# Patient Record
Sex: Female | Born: 1945 | Race: White | Hispanic: No | Marital: Single | Smoking: Former smoker
Health system: Southern US, Community
[De-identification: ages and names within clinical notes are randomized; demographics above are authoritative.]

## PROBLEM LIST (undated history)

## (undated) DIAGNOSIS — I10 Essential (primary) hypertension: Secondary | ICD-10-CM

## (undated) DIAGNOSIS — G40909 Epilepsy, unspecified, not intractable, without status epilepticus: Secondary | ICD-10-CM

## (undated) DIAGNOSIS — I8390 Asymptomatic varicose veins of unspecified lower extremity: Secondary | ICD-10-CM

## (undated) DIAGNOSIS — F32A Depression, unspecified: Secondary | ICD-10-CM

## (undated) DIAGNOSIS — C719 Malignant neoplasm of brain, unspecified: Secondary | ICD-10-CM

## (undated) DIAGNOSIS — E785 Hyperlipidemia, unspecified: Secondary | ICD-10-CM

## (undated) DIAGNOSIS — E079 Disorder of thyroid, unspecified: Secondary | ICD-10-CM

## (undated) DIAGNOSIS — C801 Malignant (primary) neoplasm, unspecified: Secondary | ICD-10-CM

## (undated) DIAGNOSIS — F329 Major depressive disorder, single episode, unspecified: Secondary | ICD-10-CM

## (undated) DIAGNOSIS — E119 Type 2 diabetes mellitus without complications: Secondary | ICD-10-CM

## (undated) HISTORY — DX: Type 2 diabetes mellitus without complications: E11.9

## (undated) HISTORY — DX: Asymptomatic varicose veins of unspecified lower extremity: I83.90

## (undated) HISTORY — DX: Essential (primary) hypertension: I10

## (undated) HISTORY — PX: BILATERAL CARPAL TUNNEL RELEASE: SHX6508

## (undated) HISTORY — DX: Hyperlipidemia, unspecified: E78.5

## (undated) HISTORY — PX: OTHER SURGICAL HISTORY: SHX169

## (undated) HISTORY — DX: Major depressive disorder, single episode, unspecified: F32.9

## (undated) HISTORY — DX: Epilepsy, unspecified, not intractable, without status epilepticus: G40.909

## (undated) HISTORY — DX: Disorder of thyroid, unspecified: E07.9

## (undated) HISTORY — DX: Depression, unspecified: F32.A

---
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
## 1987-08-11 HISTORY — PX: TOE SURGERY: SHX1073

## 1997-05-10 HISTORY — PX: CERVICAL SPINE SURGERY: SHX589

## 2005-04-27 ENCOUNTER — Ambulatory Visit: Payer: Self-pay

## 2005-11-09 ENCOUNTER — Emergency Department: Payer: Self-pay | Admitting: Emergency Medicine

## 2008-04-09 ENCOUNTER — Ambulatory Visit: Payer: Self-pay

## 2008-04-20 ENCOUNTER — Other Ambulatory Visit: Payer: Self-pay

## 2008-04-20 ENCOUNTER — Ambulatory Visit: Payer: Self-pay | Admitting: Surgery

## 2008-04-24 ENCOUNTER — Ambulatory Visit: Payer: Self-pay | Admitting: Surgery

## 2008-04-24 HISTORY — PX: CHOLECYSTECTOMY: SHX55

## 2008-05-21 ENCOUNTER — Ambulatory Visit: Payer: Self-pay | Admitting: Family Medicine

## 2009-08-02 ENCOUNTER — Emergency Department: Payer: Self-pay | Admitting: Emergency Medicine

## 2009-10-22 ENCOUNTER — Ambulatory Visit: Payer: Self-pay | Admitting: Family Medicine

## 2010-02-26 ENCOUNTER — Ambulatory Visit: Payer: Self-pay | Admitting: Gastroenterology

## 2010-02-26 LAB — HM COLONOSCOPY

## 2010-10-07 ENCOUNTER — Ambulatory Visit: Payer: Self-pay | Admitting: Obstetrics and Gynecology

## 2011-08-17 DIAGNOSIS — I10 Essential (primary) hypertension: Secondary | ICD-10-CM | POA: Diagnosis not present

## 2011-08-17 DIAGNOSIS — Z23 Encounter for immunization: Secondary | ICD-10-CM | POA: Diagnosis not present

## 2011-08-17 DIAGNOSIS — S335XXA Sprain of ligaments of lumbar spine, initial encounter: Secondary | ICD-10-CM | POA: Diagnosis not present

## 2012-01-26 DIAGNOSIS — Z23 Encounter for immunization: Secondary | ICD-10-CM | POA: Diagnosis not present

## 2012-01-26 DIAGNOSIS — I10 Essential (primary) hypertension: Secondary | ICD-10-CM | POA: Diagnosis not present

## 2012-01-26 DIAGNOSIS — J069 Acute upper respiratory infection, unspecified: Secondary | ICD-10-CM | POA: Diagnosis not present

## 2012-03-03 DIAGNOSIS — E785 Hyperlipidemia, unspecified: Secondary | ICD-10-CM | POA: Diagnosis not present

## 2012-03-03 DIAGNOSIS — I10 Essential (primary) hypertension: Secondary | ICD-10-CM | POA: Diagnosis not present

## 2012-03-03 DIAGNOSIS — E039 Hypothyroidism, unspecified: Secondary | ICD-10-CM | POA: Diagnosis not present

## 2012-03-03 DIAGNOSIS — E119 Type 2 diabetes mellitus without complications: Secondary | ICD-10-CM | POA: Diagnosis not present

## 2012-03-08 DIAGNOSIS — R1032 Left lower quadrant pain: Secondary | ICD-10-CM | POA: Diagnosis not present

## 2012-03-08 DIAGNOSIS — I10 Essential (primary) hypertension: Secondary | ICD-10-CM | POA: Diagnosis not present

## 2012-03-08 DIAGNOSIS — E039 Hypothyroidism, unspecified: Secondary | ICD-10-CM | POA: Diagnosis not present

## 2012-03-08 DIAGNOSIS — E785 Hyperlipidemia, unspecified: Secondary | ICD-10-CM | POA: Diagnosis not present

## 2012-03-10 DIAGNOSIS — K5732 Diverticulitis of large intestine without perforation or abscess without bleeding: Secondary | ICD-10-CM | POA: Diagnosis not present

## 2012-03-10 DIAGNOSIS — I10 Essential (primary) hypertension: Secondary | ICD-10-CM | POA: Diagnosis not present

## 2012-03-10 DIAGNOSIS — E039 Hypothyroidism, unspecified: Secondary | ICD-10-CM | POA: Diagnosis not present

## 2012-03-10 DIAGNOSIS — E785 Hyperlipidemia, unspecified: Secondary | ICD-10-CM | POA: Diagnosis not present

## 2012-03-22 DIAGNOSIS — H2510 Age-related nuclear cataract, unspecified eye: Secondary | ICD-10-CM | POA: Diagnosis not present

## 2012-05-31 DIAGNOSIS — E039 Hypothyroidism, unspecified: Secondary | ICD-10-CM | POA: Diagnosis not present

## 2012-05-31 DIAGNOSIS — E119 Type 2 diabetes mellitus without complications: Secondary | ICD-10-CM | POA: Diagnosis not present

## 2012-11-15 DIAGNOSIS — K5732 Diverticulitis of large intestine without perforation or abscess without bleeding: Secondary | ICD-10-CM | POA: Diagnosis not present

## 2012-11-15 DIAGNOSIS — N3090 Cystitis, unspecified without hematuria: Secondary | ICD-10-CM | POA: Diagnosis not present

## 2012-11-15 DIAGNOSIS — Z8669 Personal history of other diseases of the nervous system and sense organs: Secondary | ICD-10-CM | POA: Diagnosis not present

## 2012-12-05 DIAGNOSIS — R569 Unspecified convulsions: Secondary | ICD-10-CM | POA: Diagnosis not present

## 2012-12-05 DIAGNOSIS — R51 Headache: Secondary | ICD-10-CM | POA: Diagnosis not present

## 2012-12-05 DIAGNOSIS — E669 Obesity, unspecified: Secondary | ICD-10-CM | POA: Diagnosis not present

## 2013-01-10 DIAGNOSIS — R569 Unspecified convulsions: Secondary | ICD-10-CM | POA: Diagnosis not present

## 2013-03-27 DIAGNOSIS — K5732 Diverticulitis of large intestine without perforation or abscess without bleeding: Secondary | ICD-10-CM | POA: Diagnosis not present

## 2013-03-27 DIAGNOSIS — N3090 Cystitis, unspecified without hematuria: Secondary | ICD-10-CM | POA: Diagnosis not present

## 2013-03-27 DIAGNOSIS — E119 Type 2 diabetes mellitus without complications: Secondary | ICD-10-CM | POA: Diagnosis not present

## 2013-03-29 DIAGNOSIS — Z0289 Encounter for other administrative examinations: Secondary | ICD-10-CM | POA: Diagnosis not present

## 2013-03-29 DIAGNOSIS — K5732 Diverticulitis of large intestine without perforation or abscess without bleeding: Secondary | ICD-10-CM | POA: Diagnosis not present

## 2013-03-29 DIAGNOSIS — N3090 Cystitis, unspecified without hematuria: Secondary | ICD-10-CM | POA: Diagnosis not present

## 2013-03-29 DIAGNOSIS — Z111 Encounter for screening for respiratory tuberculosis: Secondary | ICD-10-CM | POA: Diagnosis not present

## 2013-05-26 DIAGNOSIS — E669 Obesity, unspecified: Secondary | ICD-10-CM | POA: Diagnosis not present

## 2013-05-26 DIAGNOSIS — E119 Type 2 diabetes mellitus without complications: Secondary | ICD-10-CM | POA: Diagnosis not present

## 2013-05-26 DIAGNOSIS — R51 Headache: Secondary | ICD-10-CM | POA: Diagnosis not present

## 2013-05-26 DIAGNOSIS — R569 Unspecified convulsions: Secondary | ICD-10-CM | POA: Diagnosis not present

## 2013-09-11 DIAGNOSIS — J0100 Acute maxillary sinusitis, unspecified: Secondary | ICD-10-CM | POA: Diagnosis not present

## 2013-09-11 DIAGNOSIS — Z111 Encounter for screening for respiratory tuberculosis: Secondary | ICD-10-CM | POA: Diagnosis not present

## 2013-09-11 DIAGNOSIS — K5732 Diverticulitis of large intestine without perforation or abscess without bleeding: Secondary | ICD-10-CM | POA: Diagnosis not present

## 2014-04-09 DIAGNOSIS — E785 Hyperlipidemia, unspecified: Secondary | ICD-10-CM | POA: Diagnosis not present

## 2014-04-09 DIAGNOSIS — I10 Essential (primary) hypertension: Secondary | ICD-10-CM | POA: Diagnosis not present

## 2014-04-09 DIAGNOSIS — K5732 Diverticulitis of large intestine without perforation or abscess without bleeding: Secondary | ICD-10-CM | POA: Diagnosis not present

## 2014-04-09 DIAGNOSIS — E119 Type 2 diabetes mellitus without complications: Secondary | ICD-10-CM | POA: Diagnosis not present

## 2014-04-09 DIAGNOSIS — E039 Hypothyroidism, unspecified: Secondary | ICD-10-CM | POA: Diagnosis not present

## 2014-04-24 DIAGNOSIS — H2510 Age-related nuclear cataract, unspecified eye: Secondary | ICD-10-CM | POA: Diagnosis not present

## 2014-05-29 DIAGNOSIS — E119 Type 2 diabetes mellitus without complications: Secondary | ICD-10-CM | POA: Insufficient documentation

## 2014-05-29 DIAGNOSIS — G40909 Epilepsy, unspecified, not intractable, without status epilepticus: Secondary | ICD-10-CM | POA: Insufficient documentation

## 2014-05-29 DIAGNOSIS — E669 Obesity, unspecified: Secondary | ICD-10-CM | POA: Insufficient documentation

## 2014-05-29 DIAGNOSIS — M79642 Pain in left hand: Secondary | ICD-10-CM | POA: Insufficient documentation

## 2014-05-29 DIAGNOSIS — I6789 Other cerebrovascular disease: Secondary | ICD-10-CM

## 2014-05-29 DIAGNOSIS — I10 Essential (primary) hypertension: Secondary | ICD-10-CM | POA: Insufficient documentation

## 2014-06-25 DIAGNOSIS — N3001 Acute cystitis with hematuria: Secondary | ICD-10-CM | POA: Diagnosis not present

## 2014-08-22 DIAGNOSIS — K5792 Diverticulitis of intestine, part unspecified, without perforation or abscess without bleeding: Secondary | ICD-10-CM | POA: Diagnosis not present

## 2014-10-17 DIAGNOSIS — E119 Type 2 diabetes mellitus without complications: Secondary | ICD-10-CM | POA: Diagnosis not present

## 2014-10-17 DIAGNOSIS — I868 Varicose veins of other specified sites: Secondary | ICD-10-CM | POA: Diagnosis not present

## 2014-10-17 DIAGNOSIS — R300 Dysuria: Secondary | ICD-10-CM | POA: Diagnosis not present

## 2014-11-09 DIAGNOSIS — I10 Essential (primary) hypertension: Secondary | ICD-10-CM | POA: Diagnosis not present

## 2014-11-09 DIAGNOSIS — E785 Hyperlipidemia, unspecified: Secondary | ICD-10-CM | POA: Diagnosis not present

## 2014-11-09 DIAGNOSIS — I8310 Varicose veins of unspecified lower extremity with inflammation: Secondary | ICD-10-CM | POA: Diagnosis not present

## 2014-11-09 DIAGNOSIS — M79609 Pain in unspecified limb: Secondary | ICD-10-CM | POA: Diagnosis not present

## 2014-11-09 DIAGNOSIS — E119 Type 2 diabetes mellitus without complications: Secondary | ICD-10-CM | POA: Diagnosis not present

## 2014-11-14 ENCOUNTER — Ambulatory Visit
Admit: 2014-11-14 | Disposition: A | Discharge status: Home / Self Care | Payer: Self-pay | Attending: Vascular Surgery | Admitting: Vascular Surgery

## 2014-11-14 DIAGNOSIS — M25532 Pain in left wrist: Secondary | ICD-10-CM | POA: Diagnosis not present

## 2014-11-14 DIAGNOSIS — E119 Type 2 diabetes mellitus without complications: Secondary | ICD-10-CM | POA: Diagnosis not present

## 2014-11-14 DIAGNOSIS — I808 Phlebitis and thrombophlebitis of other sites: Secondary | ICD-10-CM | POA: Diagnosis not present

## 2014-11-14 DIAGNOSIS — Z01812 Encounter for preprocedural laboratory examination: Secondary | ICD-10-CM | POA: Diagnosis not present

## 2014-11-14 DIAGNOSIS — I10 Essential (primary) hypertension: Secondary | ICD-10-CM | POA: Diagnosis not present

## 2014-11-14 DIAGNOSIS — Z79899 Other long term (current) drug therapy: Secondary | ICD-10-CM | POA: Diagnosis not present

## 2014-11-14 DIAGNOSIS — C73 Malignant neoplasm of thyroid gland: Secondary | ICD-10-CM | POA: Diagnosis not present

## 2014-11-14 DIAGNOSIS — E785 Hyperlipidemia, unspecified: Secondary | ICD-10-CM | POA: Diagnosis not present

## 2014-11-14 LAB — PROTIME-INR
INR: 1
Prothrombin Time: 13 secs

## 2014-11-14 LAB — CBC
HCT: 38.2 % (ref 35.0–47.0)
HGB: 12.7 g/dL (ref 12.0–16.0)
MCH: 30.2 pg (ref 26.0–34.0)
MCHC: 33.3 g/dL (ref 32.0–36.0)
MCV: 91 fL (ref 80–100)
PLATELETS: 227 10*3/uL (ref 150–440)
RBC: 4.21 10*6/uL (ref 3.80–5.20)
RDW: 12.5 % (ref 11.5–14.5)
WBC: 10 10*3/uL (ref 3.6–11.0)

## 2014-11-14 LAB — BASIC METABOLIC PANEL
Anion Gap: 9 (ref 7–16)
BUN: 31 mg/dL — ABNORMAL HIGH
CHLORIDE: 110 mmol/L
CO2: 23 mmol/L
Calcium, Total: 9.5 mg/dL
Creatinine: 1.08 mg/dL — ABNORMAL HIGH
EGFR (Non-African Amer.): 53 — ABNORMAL LOW
Glucose: 104 mg/dL — ABNORMAL HIGH
Potassium: 3.8 mmol/L
Sodium: 142 mmol/L

## 2014-11-14 LAB — APTT: Activated PTT: 26.6 secs (ref 23.6–35.9)

## 2014-11-21 ENCOUNTER — Ambulatory Visit
Admit: 2014-11-21 | Disposition: A | Discharge status: Home / Self Care | Payer: Self-pay | Attending: Vascular Surgery | Admitting: Vascular Surgery

## 2014-11-21 DIAGNOSIS — M25532 Pain in left wrist: Secondary | ICD-10-CM | POA: Diagnosis not present

## 2014-11-21 DIAGNOSIS — C73 Malignant neoplasm of thyroid gland: Secondary | ICD-10-CM | POA: Diagnosis not present

## 2014-11-21 DIAGNOSIS — Z8489 Family history of other specified conditions: Secondary | ICD-10-CM | POA: Diagnosis not present

## 2014-11-21 DIAGNOSIS — K5790 Diverticulosis of intestine, part unspecified, without perforation or abscess without bleeding: Secondary | ICD-10-CM | POA: Diagnosis not present

## 2014-11-21 DIAGNOSIS — E119 Type 2 diabetes mellitus without complications: Secondary | ICD-10-CM | POA: Diagnosis not present

## 2014-11-21 DIAGNOSIS — Z8711 Personal history of peptic ulcer disease: Secondary | ICD-10-CM | POA: Diagnosis not present

## 2014-11-21 DIAGNOSIS — Z87442 Personal history of urinary calculi: Secondary | ICD-10-CM | POA: Diagnosis not present

## 2014-11-21 DIAGNOSIS — I82602 Acute embolism and thrombosis of unspecified veins of left upper extremity: Secondary | ICD-10-CM | POA: Diagnosis not present

## 2014-11-21 DIAGNOSIS — Z87440 Personal history of urinary (tract) infections: Secondary | ICD-10-CM | POA: Diagnosis not present

## 2014-11-21 DIAGNOSIS — I808 Phlebitis and thrombophlebitis of other sites: Secondary | ICD-10-CM | POA: Diagnosis not present

## 2014-11-21 DIAGNOSIS — Z833 Family history of diabetes mellitus: Secondary | ICD-10-CM | POA: Diagnosis not present

## 2014-11-21 DIAGNOSIS — I10 Essential (primary) hypertension: Secondary | ICD-10-CM | POA: Diagnosis not present

## 2014-11-21 DIAGNOSIS — E785 Hyperlipidemia, unspecified: Secondary | ICD-10-CM | POA: Diagnosis not present

## 2014-11-21 DIAGNOSIS — Z87891 Personal history of nicotine dependence: Secondary | ICD-10-CM | POA: Diagnosis not present

## 2014-11-21 DIAGNOSIS — I82622 Acute embolism and thrombosis of deep veins of left upper extremity: Secondary | ICD-10-CM | POA: Diagnosis not present

## 2014-11-21 DIAGNOSIS — Z9049 Acquired absence of other specified parts of digestive tract: Secondary | ICD-10-CM | POA: Diagnosis not present

## 2014-11-21 DIAGNOSIS — Z7982 Long term (current) use of aspirin: Secondary | ICD-10-CM | POA: Diagnosis not present

## 2014-11-21 DIAGNOSIS — Z808 Family history of malignant neoplasm of other organs or systems: Secondary | ICD-10-CM | POA: Diagnosis not present

## 2014-11-21 DIAGNOSIS — I998 Other disorder of circulatory system: Secondary | ICD-10-CM | POA: Diagnosis not present

## 2014-11-21 DIAGNOSIS — Z981 Arthrodesis status: Secondary | ICD-10-CM | POA: Diagnosis not present

## 2014-11-21 DIAGNOSIS — Z9889 Other specified postprocedural states: Secondary | ICD-10-CM | POA: Diagnosis not present

## 2014-11-21 DIAGNOSIS — Z79899 Other long term (current) drug therapy: Secondary | ICD-10-CM | POA: Diagnosis not present

## 2014-11-21 DIAGNOSIS — Z8249 Family history of ischemic heart disease and other diseases of the circulatory system: Secondary | ICD-10-CM | POA: Diagnosis not present

## 2014-11-29 DIAGNOSIS — I808 Phlebitis and thrombophlebitis of other sites: Secondary | ICD-10-CM | POA: Diagnosis not present

## 2014-11-29 DIAGNOSIS — E785 Hyperlipidemia, unspecified: Secondary | ICD-10-CM | POA: Diagnosis not present

## 2014-11-29 DIAGNOSIS — I10 Essential (primary) hypertension: Secondary | ICD-10-CM | POA: Diagnosis not present

## 2014-11-29 DIAGNOSIS — Z4802 Encounter for removal of sutures: Secondary | ICD-10-CM | POA: Diagnosis not present

## 2014-11-29 DIAGNOSIS — E119 Type 2 diabetes mellitus without complications: Secondary | ICD-10-CM | POA: Diagnosis not present

## 2014-12-03 LAB — SURGICAL PATHOLOGY

## 2014-12-09 NOTE — Op Note (Signed)
PATIENT NAME:  Martha [name], Martha [name] MR#:  [id number] DATE OF BIRTH:  [birth year]  DATE OF PROCEDURE:  11/21/2014  PREOPERATIVE DIAGNOSES:  1.  Inflamed blood vessel left wrist causing pain.  2.  Hyperlipidemia.  3.  Diabetes.  4.  Hypertension.  5.  Thyroid cancer.   POSTOPERATIVE DIAGNOSES: 1.  Inflamed blood vessel left wrist causing pain.  2.  Hyperlipidemia.  3.  Diabetes.  4.  Hypertension.  5.  Thyroid cancer.   PROCEDURE:  Excision of inflamed blood vessel left wrist.   SURGEON: Algernon Huxley, M.D.   ANESTHESIA: MAC.   ESTIMATED BLOOD LOSS: Minimal.   INDICATION FOR PROCEDURE: This is [name] 69 year old individual who came to my office last week with painful blood vessel of the left wrist. This appeared to be an old thrombosed vein at the wrist with multiple local branches that appeared to be very superficial and causing discomfort. We discussed the options with her.  She desired to have this excised if at all possible. I told her this was certainly [name] reasonable option. I felt that she probably had an old IV near this location and this was probably an old superficial thrombophlebitis that has caused local inflammation and pain. Risks and benefits were discussed. Informed consent was obtained.   DESCRIPTION OF PROCEDURE: The patient is brought to the operative suite. After an adequate level of intravenous sedation was obtained, the left hand and arm were sterilely prepped and draped, and [name] sterile surgical field was created. The incision overlying this vessel was created.  As we dissected down, the vessel was quite thickened and it was actually hard to determine if this was an arterial branch or [name] venous branch, but I felt it was likely the symptomatic vessel.  I clamped it to ensure that the perfusion of the hand remain normal and it did as this was actually an arterial branch. This did not cause any ischemic changes. I dissected this out over several centimeters from the proximal hand back  to the distal arm just before the wrist.  Several branches were ligated and divided.  On dissection, it was ligated distally and proximally with 2-0 silk ties and [name] several centimeter segment was excised.  This corresponded with her symptomatic area.  The wound was then closed with [name] series of interrupted 3-0 Vicryl and 4-0 nylon suture in the skin. Sterile dressing was placed. The patient was awakened from anesthesia and taken to the recovery room in stable condition having tolerated the procedure well.   ____________________________ Algernon Huxley, MD jsd:sp D: 11/21/2014 08:38:38 ET T: 11/21/2014 10:43:25 ET JOB#: [id number]  cc: Algernon Huxley, MD, <Dictator> Jerrell Belfast, MD Algernon Huxley MD ELECTRONICALLY SIGNED 12/05/2014 13:45

## 2014-12-24 DIAGNOSIS — I8390 Asymptomatic varicose veins of unspecified lower extremity: Secondary | ICD-10-CM | POA: Insufficient documentation

## 2014-12-24 DIAGNOSIS — S39012A Strain of muscle, fascia and tendon of lower back, initial encounter: Secondary | ICD-10-CM | POA: Insufficient documentation

## 2014-12-24 DIAGNOSIS — E1165 Type 2 diabetes mellitus with hyperglycemia: Secondary | ICD-10-CM | POA: Insufficient documentation

## 2014-12-24 DIAGNOSIS — E785 Hyperlipidemia, unspecified: Secondary | ICD-10-CM | POA: Insufficient documentation

## 2014-12-24 DIAGNOSIS — N3090 Cystitis, unspecified without hematuria: Secondary | ICD-10-CM | POA: Insufficient documentation

## 2014-12-24 DIAGNOSIS — N3001 Acute cystitis with hematuria: Secondary | ICD-10-CM | POA: Insufficient documentation

## 2014-12-24 DIAGNOSIS — E039 Hypothyroidism, unspecified: Secondary | ICD-10-CM | POA: Insufficient documentation

## 2014-12-24 DIAGNOSIS — F32A Depression, unspecified: Secondary | ICD-10-CM | POA: Insufficient documentation

## 2014-12-24 DIAGNOSIS — K5792 Diverticulitis of intestine, part unspecified, without perforation or abscess without bleeding: Secondary | ICD-10-CM | POA: Insufficient documentation

## 2014-12-24 DIAGNOSIS — I10 Essential (primary) hypertension: Secondary | ICD-10-CM | POA: Insufficient documentation

## 2014-12-24 DIAGNOSIS — G43909 Migraine, unspecified, not intractable, without status migrainosus: Secondary | ICD-10-CM | POA: Insufficient documentation

## 2014-12-24 DIAGNOSIS — Z8669 Personal history of other diseases of the nervous system and sense organs: Secondary | ICD-10-CM | POA: Insufficient documentation

## 2014-12-24 DIAGNOSIS — M545 Low back pain, unspecified: Secondary | ICD-10-CM | POA: Insufficient documentation

## 2014-12-24 DIAGNOSIS — F329 Major depressive disorder, single episode, unspecified: Secondary | ICD-10-CM | POA: Insufficient documentation

## 2014-12-24 DIAGNOSIS — E119 Type 2 diabetes mellitus without complications: Secondary | ICD-10-CM | POA: Insufficient documentation

## 2014-12-24 DIAGNOSIS — R1032 Left lower quadrant pain: Secondary | ICD-10-CM | POA: Insufficient documentation

## 2014-12-24 DIAGNOSIS — IMO0002 Reserved for concepts with insufficient information to code with codable children: Secondary | ICD-10-CM | POA: Insufficient documentation

## 2014-12-24 DIAGNOSIS — G40909 Epilepsy, unspecified, not intractable, without status epilepticus: Secondary | ICD-10-CM | POA: Insufficient documentation

## 2015-01-18 ENCOUNTER — Other Ambulatory Visit: Payer: Self-pay | Admitting: Family Medicine

## 2015-01-22 ENCOUNTER — Encounter: Payer: Self-pay | Admitting: Family Medicine

## 2015-01-22 ENCOUNTER — Ambulatory Visit (INDEPENDENT_AMBULATORY_CARE_PROVIDER_SITE_OTHER): Payer: Medicare Other | Admitting: Family Medicine

## 2015-01-22 VITALS — BP 108/68 | HR 76 | Temp 98.3°F | Resp 16 | Ht 62.0 in | Wt 174.2 lb

## 2015-01-22 DIAGNOSIS — Z780 Asymptomatic menopausal state: Secondary | ICD-10-CM

## 2015-01-22 DIAGNOSIS — Z1389 Encounter for screening for other disorder: Secondary | ICD-10-CM | POA: Diagnosis not present

## 2015-01-22 DIAGNOSIS — E890 Postprocedural hypothyroidism: Secondary | ICD-10-CM

## 2015-01-22 DIAGNOSIS — N189 Chronic kidney disease, unspecified: Secondary | ICD-10-CM | POA: Diagnosis not present

## 2015-01-22 DIAGNOSIS — E1122 Type 2 diabetes mellitus with diabetic chronic kidney disease: Secondary | ICD-10-CM

## 2015-01-22 DIAGNOSIS — E785 Hyperlipidemia, unspecified: Secondary | ICD-10-CM | POA: Diagnosis not present

## 2015-01-22 DIAGNOSIS — I10 Essential (primary) hypertension: Secondary | ICD-10-CM

## 2015-01-22 NOTE — Patient Instructions (Signed)
Take second dose of metformin with the evening meal

## 2015-01-22 NOTE — Progress Notes (Signed)
Subjective:     Patient ID: Martha [name], female   DOB: 1946-07-03, 69 y.o.   MRN: [id number]  HPI  Chief Complaint  Patient presents with  . Diabetes     Patient is present today for 3 month follow up. LOV- 10/17/14 continue Metformin, HgbA1C in house 6.9%  . Hypertension    LOV= 10/17/14 continue Lisnopril/HCTZ  States she forgets to take her second dose of metformin as taking in the evening rather than with her meal.Pending annual eye exam.   Review of Systems  Respiratory: Negative for shortness of breath.   Cardiovascular: Negative for chest pain and palpitations.  Has not seen her gyn for a while and agrees to reestablishing with her.    Objective:   Physical Exam Lungs: clear Heart: RRR without murmur Lower extremities: no edema     Assessment:     1. Type 2 diabetes mellitus with diabetic chronic kidney disease  - Hemoglobin A1c - Renal function panel  2. HLD (hyperlipidemia)  - Lipid panel  3. Essential hypertension   4. Postoperative hypothyroidism  - TSH  5. Menopause present - Ambulatory referral to Gynecology    Plan:     Continue present medication. Take second dose of metformin with her evening meal to increase efficacy and compliance.

## 2015-01-23 ENCOUNTER — Other Ambulatory Visit: Payer: Self-pay | Admitting: Family Medicine

## 2015-01-23 DIAGNOSIS — E1169 Type 2 diabetes mellitus with other specified complication: Secondary | ICD-10-CM

## 2015-01-23 DIAGNOSIS — E785 Hyperlipidemia, unspecified: Principal | ICD-10-CM

## 2015-01-23 LAB — LIPID PANEL
CHOLESTEROL TOTAL: 223 mg/dL — AB (ref 100–199)
Chol/HDL Ratio: 5 ratio units — ABNORMAL HIGH (ref 0.0–4.4)
HDL: 45 mg/dL (ref >39)
Triglycerides: 464 mg/dL — ABNORMAL HIGH (ref 0–149)

## 2015-01-23 LAB — HEMOGLOBIN A1C
Est. average glucose Bld gHb Est-mCnc: 177 mg/dL
HEMOGLOBIN A1C: 7.8 % — AB (ref 4.8–5.6)

## 2015-01-23 LAB — RENAL FUNCTION PANEL
ALBUMIN: 4.2 g/dL (ref 3.6–4.8)
BUN / CREAT RATIO: 28 — AB (ref 11–26)
BUN: 36 mg/dL — ABNORMAL HIGH (ref 8–27)
CHLORIDE: 107 mmol/L (ref 97–108)
CO2: 26 mmol/L (ref 18–29)
Calcium: 9.9 mg/dL (ref 8.7–10.3)
Creatinine, Ser: 1.28 mg/dL — ABNORMAL HIGH (ref 0.57–1.00)
GFR calc Af Amer: 50 mL/min/{1.73_m2} — ABNORMAL LOW (ref >59)
GFR calc non Af Amer: 43 mL/min/{1.73_m2} — ABNORMAL LOW (ref >59)
Glucose: 122 mg/dL — ABNORMAL HIGH (ref 65–99)
Phosphorus: 4.3 mg/dL (ref 2.5–4.5)
Potassium: 4 mmol/L (ref 3.5–5.2)
Sodium: 146 mmol/L — ABNORMAL HIGH (ref 134–144)

## 2015-01-23 LAB — TSH: TSH: 0.128 u[IU]/mL — ABNORMAL LOW (ref 0.450–4.500)

## 2015-01-25 ENCOUNTER — Telehealth: Payer: Self-pay

## 2015-01-25 NOTE — Telephone Encounter (Signed)
-----   Message from Carmon Ginsberg, [place] sent at 01/23/2015  8:03 AM EDT ----- Kidney function has declined a bit so would have her come back in 3 months. Also have added order for Direct LDL-call and see if they can add to current labs

## 2015-01-25 NOTE — Telephone Encounter (Signed)
Patient has been advised of lab report.

## 2015-02-14 ENCOUNTER — Ambulatory Visit (INDEPENDENT_AMBULATORY_CARE_PROVIDER_SITE_OTHER): Payer: Medicare Other | Admitting: Obstetrics and Gynecology

## 2015-02-14 ENCOUNTER — Encounter: Payer: Self-pay | Admitting: Obstetrics and Gynecology

## 2015-02-14 VITALS — BP 117/79 | HR 93 | Ht 61.0 in | Wt 172.2 lb

## 2015-02-14 DIAGNOSIS — Z9189 Other specified personal risk factors, not elsewhere classified: Secondary | ICD-10-CM

## 2015-02-14 DIAGNOSIS — Z124 Encounter for screening for malignant neoplasm of cervix: Secondary | ICD-10-CM | POA: Diagnosis not present

## 2015-02-14 DIAGNOSIS — Z1231 Encounter for screening mammogram for malignant neoplasm of breast: Secondary | ICD-10-CM

## 2015-02-14 DIAGNOSIS — Z01419 Encounter for gynecological examination (general) (routine) without abnormal findings: Secondary | ICD-10-CM | POA: Diagnosis not present

## 2015-02-14 DIAGNOSIS — Z87898 Personal history of other specified conditions: Secondary | ICD-10-CM | POA: Diagnosis not present

## 2015-02-14 DIAGNOSIS — D229 Melanocytic nevi, unspecified: Secondary | ICD-10-CM | POA: Diagnosis not present

## 2015-02-14 NOTE — Progress Notes (Signed)
Subjective:    Martha [name] is a 69 y.o. G0P0 single postmenopausal female who presents for annual exam. Patient referred from PCP as she has not had a gynecologic exam in ~ 10 years. The patient has no complaints today. The patient is not currently sexually active. GYN screening history: last pap: approximate date 10 years ago and was normal and last mammogram: approximate date approximately 10 years ago and was normal. Last colonoscopy was 2015, normal. The patient is not taking hormone replacement therapy. Patient denies post-menopausal vaginal bleeding.. The patient wears seatbelts: yes. The patient participates in regular exercise: yes. Has the patient ever been transfused or tattooed?: no. The patient reports that there is not domestic violence in her life.   Menstrual History: OB History    Gravida Para Term Preterm AB TAB SAB Ectopic Multiple Living   0 0 0 0 0 0 0 0 0 0       Menarche age: 21 No LMP recorded. Patient is postmenopausal.    Past Medical History  Diagnosis Date  . Hypertension   . Diabetes mellitus without complication   . Hyperlipidemia   . Depression   . Varicose vein   . Epilepsy   . Thyroid disease     h/o thyroid ca- 30 years ago   Past Surgical History  Procedure Laterality Date  . Cholecystectomy  04/24/2008    status post laparoscopic Dr.Ely  . Cervical spine surgery  05/1997    spinal fusion  . Bilateral carpal tunnel release  1990,1991    left hand in 1990 right hand in '91  . Toe surgery Left 1989  . Thyroidectomy, partial  1964    due to cancerous tumor    Family History  Problem Relation Age of Onset  . Arthritis Mother   . Hyperlipidemia Mother   . Hypertension Mother   . Heart disease Mother   . Diabetes Mother     type 2  . Thyroid disease Mother   . Cancer Father     colon  . Alcohol abuse Brother   . Hyperlipidemia Brother   . Hypertension Brother   . Breast cancer Neg Hx   . Ovarian cancer Neg Hx     History    Social History  . Marital Status: Single    Spouse Name: N/A  . Number of Children: N/A  . Years of Education: N/A   Occupational History  . Not on file.   Social History Main Topics  . Smoking status: Former Research scientist (life sciences)  . Smokeless tobacco: Not on file     Comment: quit smoking in 2007  . Alcohol Use: No  . Drug Use: No  . Sexual Activity: No   Other Topics Concern  . Not on file   Social History Narrative   Outpatient Encounter Prescriptions as of 02/14/2015  Medication Sig Note  . amLODipine (NORVASC) 5 MG tablet Take 5 mg by mouth daily. 12/24/2014: Received from: [facility]:   . levothyroxine (SYNTHROID, LEVOTHROID) 137 MCG tablet Take 137 mcg by mouth daily. 12/24/2014: Received from: [facility]:   . lisinopril-hydrochlorothiazide (PRINZIDE,ZESTORETIC) 20-25 MG per tablet TAKE 1 TABLET BY MOUTH ONCE A DAY   . metFORMIN (GLUCOPHAGE) 1000 MG tablet Take 1 tablet by mouth 2 (two) times daily. 12/24/2014: Received from: [facility]:   . MULTIPLE VITAMIN PO Take 1 tablet by mouth daily. 12/24/2014: Received from: [facility]:   . pravastatin (  PRAVACHOL) 40 MG tablet Take 1 tablet by mouth daily. 12/24/2014: Received from: [facility]:   . sertraline (ZOLOFT) 50 MG tablet Take 1 tablet by mouth daily. 12/24/2014: Received from: [facility]:     Allergies  Allergen Reactions  . Penicillins Rash     Review of Systems Constitutional: negative for chills, fatigue, fevers and sweats Eyes: negative for irritation, redness and visual disturbance Ears, nose, mouth, throat, and face: negative for hearing loss, nasal congestion, snoring and tinnitus Respiratory: negative for asthma, cough, sputum Cardiovascular: negative for chest pain, dyspnea, exertional chest pressure/discomfort, irregular heart beat,  palpitations and syncope Gastrointestinal: negative for abdominal pain, change in bowel habits, nausea and vomiting Genitourinary: negative for abnormal menstrual periods, genital lesions, sexual problems and vaginal discharge, dysuria and urinary incontinence Integument/breast: negative for breast lump, breast tenderness and nipple discharge Hematologic/lymphatic: negative for bleeding and easy bruising Musculoskeletal:negative for back pain and muscle weakness Neurological: negative for dizziness, headaches, vertigo and weakness Endocrine: negative for diabetic symptoms including polydipsia, polyuria and skin dryness Allergic/Immunologic: negative for hay fever and urticaria     Objective:  Blood pressure 117/79, pulse 93, height 5\' 1"  (1.549 m), weight 172 lb 3.2 oz (78.109 kg).  General Appearance:    Alert, cooperative, no distress, appears stated age  Head:    Normocephalic, without obvious abnormality, atraumatic  Eyes:    PERRL, conjunctiva/corneas clear, EOM's intact, both eyes  Ears:    Normal external ear canals, both ears  Nose:   Nares normal, septum midline, mucosa normal, no drainage or sinus tenderness  Throat:   Lips, mucosa, and tongue normal; teeth and gums normal  Neck:   Supple, symmetrical, trachea midline, no adenopathy; thyroid: no enlargement/tenderness/nodules; no carotid bruit or JVD  Back:     Symmetric, no curvature, ROM normal, no CVA tenderness  Lungs:     Clear to auscultation bilaterally, respirations unlabored  Chest Wall:    No tenderness or deformity   Heart:    Regular rate and rhythm, S1 and S2 normal, no murmur, rub or gallop  Breast Exam:    No tenderness, masses, or nipple abnormality  Abdomen:     Soft, non-tender, bowel sounds active all four quadrants, no masses, no organomegaly.    Genitalia:    Pelvic:external genitalia normal. Rectovaginal septum normal. Vagina mildly stenotic, vagina without lesions, discharge, or tenderness.  Moderate atrophy  present.  Cervix normal in appearance, no cervical motion tenderness, no adnexal masses or tenderness.     Rectal:    Normal external sphincter.  No hemorrhoids appreciated. Internal exam not done.   Extremities:   Extremities normal, atraumatic, no cyanosis or edema  Pulses:   2+ and symmetric all extremities  Skin:   Skin color, texture, turgor normal, no rashes.  Numerous moles on skin, several irregular shaped, crusting.    Lymph nodes:   Cervical, supraclavicular, and axillary nodes normal  Neurologic:   CNII-XII intact, normal strength, sensation and reflexes    throughout     Assessment:    Normal gyn exam Menopause  Numerous moles on skin, several irregular shaped, crusting.   Plan:    Discussed healthy lifestyle modifications. Patient out of age range for pap, and no h/o CIN II or higher in the past. Does not need further pap smears, but still encouraged to have pelvic exam performed every 1-2 years as patient still has all reproductive organs.  Follow up in 1 year or as  needed. Mammogram.   Has had recent screening labs by PCP.  Discussed role of DEXA Scan for bone health.  Will order. Encouraged to take Vit D and Calcium supplements.  Referral to Dermatology for assessment of moles of skin.    Rubie Maid, MD [facility]

## 2015-02-14 NOTE — Progress Notes (Signed)
Patient ID: Martha [name], female   DOB: [birth year], 69 y.o.   MRN: [id number]   Pt presents for gyn exam. menopausal mammo greater than 10 years Pap 5 years ago- wnl

## 2015-03-18 DIAGNOSIS — Z08 Encounter for follow-up examination after completed treatment for malignant neoplasm: Secondary | ICD-10-CM | POA: Diagnosis not present

## 2015-03-18 DIAGNOSIS — Z85828 Personal history of other malignant neoplasm of skin: Secondary | ICD-10-CM | POA: Diagnosis not present

## 2015-03-18 DIAGNOSIS — Z1283 Encounter for screening for malignant neoplasm of skin: Secondary | ICD-10-CM | POA: Diagnosis not present

## 2015-03-18 DIAGNOSIS — L821 Other seborrheic keratosis: Secondary | ICD-10-CM | POA: Diagnosis not present

## 2015-04-29 ENCOUNTER — Ambulatory Visit (INDEPENDENT_AMBULATORY_CARE_PROVIDER_SITE_OTHER): Payer: Medicare Other | Admitting: Family Medicine

## 2015-04-29 ENCOUNTER — Ambulatory Visit: Payer: Medicare Other | Admitting: Family Medicine

## 2015-04-29 ENCOUNTER — Encounter: Payer: Self-pay | Admitting: Family Medicine

## 2015-04-29 VITALS — BP 110/70 | HR 85 | Temp 97.4°F | Resp 16 | Wt 171.5 lb

## 2015-04-29 DIAGNOSIS — E1122 Type 2 diabetes mellitus with diabetic chronic kidney disease: Secondary | ICD-10-CM

## 2015-04-29 DIAGNOSIS — N189 Chronic kidney disease, unspecified: Secondary | ICD-10-CM | POA: Diagnosis not present

## 2015-04-29 DIAGNOSIS — M722 Plantar fascial fibromatosis: Secondary | ICD-10-CM | POA: Diagnosis not present

## 2015-04-29 DIAGNOSIS — E890 Postprocedural hypothyroidism: Secondary | ICD-10-CM | POA: Diagnosis not present

## 2015-04-29 LAB — POCT GLYCOSYLATED HEMOGLOBIN (HGB A1C): Hemoglobin A1C: 7.6

## 2015-04-29 MED ORDER — HYDROCODONE-ACETAMINOPHEN 5-325 MG PO TABS: ORAL_TABLET | ORAL | Status: DC

## 2015-04-29 MED ORDER — GLIPIZIDE 5 MG PO TABS: 5.0000 mg | ORAL_TABLET | Freq: Two times a day (BID) | ORAL | Status: DC

## 2015-04-29 NOTE — Progress Notes (Signed)
Subjective:     Patient ID: Martha [name], female   DOB: Apr 13, 1946, 69 y.o.   MRN: [id number]  HPI  Chief Complaint  Patient presents with  . Foot Pain    Patient comes in office today with complaints of right foot/ankle pain for the past week and half. Patient reports prior to when pain had began she was climing up and down stairs in her home putting items away and believes she may have twisted it. Patient reports taking otc Ibupofen and using boot and crutches for stability  . Diabetes    Patient is present in offie for follow up from 01/22/15, HgbA1C in house was 7.8%. Renal Function Panel ordered showed that patient had a sliht decline in her kidney function  States her foot felt like it was getting better until the last 24 hours when the pain became severe: "I was crawling to the bathroom." Localizes to the plantar arch of her right foot. States she has been doing better remembering to take metformin twice daily. and has been swimming until she injured her foot.   Review of Systems     Objective:   Physical Exam  Constitutional: She appears well-developed and well-nourished. She appears distressed (due to pain with weight bearing).  Cardiovascular:  Pulses:      Dorsalis pedis pulses are 2+ on the right side.       Posterior tibial pulses are 2+ on the right side.  Musculoskeletal:  Mild swelling of the dorsum of her foot with pain on palpation of her plantar mid-arch. Ankle and foot ligaments stable, DF/PF 5/5.       Assessment:    1. Type 2 diabetes mellitus with diabetic chronic kidney disease - POCT glycosylated hemoglobin (Hb A1C) - glipiZIDE (GLUCOTROL) 5 MG tablet; Take 1 tablet (5 mg total) by mouth 2 (two) times daily before a meal.  Dispense: 60 tablet; Refill: 2 - Renal function panel  2. Plantar fasciitis of right foot* - HYDROcodone-acetaminophen (NORCO/VICODIN) 5-325 MG per tablet; One every 4-6 hours as needed for pain  Dispense: 28 tablet; Refill: 0 -  Ambulatory referral to Podiatry  3. Postoperative hypothyroidism - T4, free - TSH    Plan:    Stop nsaid's until we can reevaluate your kidney function.

## 2015-04-29 NOTE — Patient Instructions (Addendum)
We will call you with referral and lab results. Stop ibuprofen or similar medication due to your kidney status

## 2015-05-02 ENCOUNTER — Ambulatory Visit (INDEPENDENT_AMBULATORY_CARE_PROVIDER_SITE_OTHER): Payer: Medicare Other | Admitting: Podiatry

## 2015-05-02 ENCOUNTER — Encounter: Payer: Self-pay | Admitting: Podiatry

## 2015-05-02 ENCOUNTER — Telehealth: Payer: Self-pay | Admitting: *Deleted

## 2015-05-02 ENCOUNTER — Ambulatory Visit: Payer: Medicare Other

## 2015-05-02 VITALS — BP 93/63 | HR 105 | Resp 17 | Ht 60.0 in

## 2015-05-02 DIAGNOSIS — M109 Gout, unspecified: Secondary | ICD-10-CM

## 2015-05-02 DIAGNOSIS — M79671 Pain in right foot: Secondary | ICD-10-CM

## 2015-05-02 DIAGNOSIS — M8430XA Stress fracture, unspecified site, initial encounter for fracture: Secondary | ICD-10-CM

## 2015-05-02 DIAGNOSIS — M1A0710 Idiopathic chronic gout, right ankle and foot, without tophus (tophi): Secondary | ICD-10-CM

## 2015-05-02 NOTE — Progress Notes (Signed)
   Subjective:    Patient ID: Martha [name], female    DOB: June 29, 1946, 69 y.o.   MRN: [id number]  HPI 69 year old female presents the office today with concerns of right foot and ankle pain which is been ongoing for approximate 2 weeks. She states that 2 weeks ago she started having increased pain swelling and redness to her right foot within the symptoms subsided and she started to have recurrence of the last couple of days. She states that she is unable to put weight on her foot. She states that putting pressure to the foot increases the pain. She denies any recent injury or trauma. She denies any history of gout. She previously has had plantar fasciitis in this foot and believes that this may be a flare. No tingling or numbness. No other complaints at this time.   Review of Systems  Cardiovascular:       Foot swelling   Gastrointestinal: Positive for nausea and vomiting.  All other systems reviewed and are negative.      Objective:   Physical Exam AAO x3, NAD DP/PT pulses palpable bilaterally, CRT less than 3 seconds Protective sensation intact with Simms Weinstein monofilament, vibratory sensation intact, Achilles tendon reflex intact There is tenderness to palpation to The Second Metatarsal and There Is Pain in Vibratory Sensation Overlying This Area. There Is Diffuse Tenderness of the Ankle and mild pain with range of motion of the ankle joint however there is no restriction. There is mild diffuse tenderness along the dorsal medial midfoot. There is diffuse edema from ankle to the toes with increase in warmth compared to the contralateral extremity. There is a faint amount of erythema overlying the foot. There is no open lesions or pre-ulcerative lesions. There is no areas of fluctuance or crepitus. No interdigital maceration. No other areas of tenderness to bilateral lower extremities. MMT 5/5, ROM WNL.  No open lesions or pre-ulcerative lesions.  No overlying edema, erythema,  increase in warmth to bilateral lower extremities.  No pain with calf compression, swelling, warmth, erythema bilaterally.      Assessment & Plan:  69 year old female presents with right foot pain, swelling inability to bear weight possible stress fracture vs. Gout vs. Infection (less likely) -X-rays were obtained and reviewed with the patient.  -Treatment options discussed including all alternatives, risks, and complications -Etiology of symptoms were discussed -Due to the inability to bear weight dispensed short CAM boot. -Elevation. -Will order CBC, Uric acid, ESR, CRP -Follow-up in 2 weeks or sooner if any problems arise. In the meantime, encouraged to call the office with any questions, concerns, change in symptoms.   Celesta Gentile, DPM  Addendum: I received the results of the blood work which the uric acid was significantly elevated as well as inflammatory markers. Liposuction was also elevated. I still believe this is gout. We'll start with colchicine 0.6 mg daily.

## 2015-05-03 ENCOUNTER — Telehealth: Payer: Self-pay | Admitting: *Deleted

## 2015-05-03 LAB — CBC WITH DIFFERENTIAL/PLATELET
BASOS ABS: 0 10*3/uL (ref 0.0–0.2)
Basos: 0 %
EOS (ABSOLUTE): 0.2 10*3/uL (ref 0.0–0.4)
Eos: 1 %
HEMOGLOBIN: 12.6 g/dL (ref 11.1–15.9)
Hematocrit: 37.6 % (ref 34.0–46.6)
Immature Grans (Abs): 0 10*3/uL (ref 0.0–0.1)
Immature Granulocytes: 0 %
Lymphocytes Absolute: 2.2 10*3/uL (ref 0.7–3.1)
Lymphs: 16 %
MCH: 30.4 pg (ref 26.6–33.0)
MCHC: 33.5 g/dL (ref 31.5–35.7)
MCV: 91 fL (ref 79–97)
Monocytes Absolute: 0.8 10*3/uL (ref 0.1–0.9)
Monocytes: 6 %
NEUTROS ABS: 10.8 10*3/uL — AB (ref 1.4–7.0)
Neutrophils: 77 %
PLATELETS: 335 10*3/uL (ref 150–379)
RBC: 4.14 x10E6/uL (ref 3.77–5.28)
RDW: 12.8 % (ref 12.3–15.4)
WBC: 14 10*3/uL — ABNORMAL HIGH (ref 3.4–10.8)

## 2015-05-03 LAB — SEDIMENTATION RATE: SED RATE: 5 mm/h (ref 0–40)

## 2015-05-03 LAB — C-REACTIVE PROTEIN: CRP: 57.5 mg/L — AB (ref 0.0–4.9)

## 2015-05-03 LAB — URIC ACID: URIC ACID: 11.8 mg/dL — AB (ref 2.5–7.1)

## 2015-05-03 MED ORDER — COLCHICINE 0.6 MG PO TABS: 0.6000 mg | ORAL_TABLET | Freq: Every day | ORAL | Status: DC

## 2015-05-03 NOTE — Telephone Encounter (Signed)
i called patient per Dr Jacqualyn Posey to let the patient know about her bloodwork and i was sending it to her medical doctor and to tell her about her bloodwork and to go to the pharmacy and pick up her prescription. Lattie Haw

## 2015-05-07 ENCOUNTER — Ambulatory Visit (INDEPENDENT_AMBULATORY_CARE_PROVIDER_SITE_OTHER): Payer: Medicare Other | Admitting: Podiatry

## 2015-05-07 ENCOUNTER — Encounter: Payer: Self-pay | Admitting: Podiatry

## 2015-05-07 VITALS — BP 110/72 | HR 81 | Resp 17

## 2015-05-07 DIAGNOSIS — M779 Enthesopathy, unspecified: Secondary | ICD-10-CM

## 2015-05-07 DIAGNOSIS — M1A0710 Idiopathic chronic gout, right ankle and foot, without tophus (tophi): Secondary | ICD-10-CM | POA: Diagnosis not present

## 2015-05-07 NOTE — Patient Instructions (Signed)

## 2015-05-08 NOTE — Progress Notes (Signed)
Patient ID: Reynold Bowen, female   DOB: [birth year], 69 y.o.   MRN: [id number]  Subjective: 69 year old phenol presents the office for evaluation of right foot pain, gout. She states that the boot is helping significantly and she has been taking the medication. She does continue some pain to the top inside portion of her foot. She is inquiring about possible steroid injection. She states her blood sugar is controlled at this time. No recent injury or trauma. She states the swelling has decreased significantly. No other complaints at this time in no acute changes. She denies any systemic complaints as fevers, chills, nausea, vomiting.  Objective: AAO 3, NAD DP/PT pulses palpable, CRT less than 3 seconds Protective sensation intact with Simms Weinstein monofilament There is tenderness palpation of the dorsal medial aspect of the midfoot on the right foot. There is trace edema and erythema around this area. The edema appears to be significantly improved compared to what it was last appointment. There is no specific area pinpoint bony tenderness or pain the vibratory sensation at this time. There is no open lesions or pre-ulcerative lesions. No areas of fluctuance or crepitus. There is no pain with ankle, subtalar, MTPJ range of motion. calf compression of insulin, warmth, erythema.  Assessment: 69 year old female with resolving gout right foot  Plan: -Treatment options discussed including all alternatives, risks, and complications -I discussed steroid injection including risks and locations for which she understands and verbally consents. Under sterile conditions a total of 1.5 mL of a mixture of Kenalog 10, 0.5% Marcaine plain and 2% lidocaine plain was infiltrated into the area of maximal tenderness on the dorsal medial aspect of the right midfoot without complications. Bandage was applied. Postinjection care was discussed the patient. -Finish course of colchicine. -She can transition back to  regular shoe as tolerated. -Diet modifications discussed. She states that prior to that she had a lot of shellfish. -Follow-up in 3-4 weeks if symptoms continue or sooner if any problems arise. In the meantime, encouraged to call the office with any questions, concerns, change in symptoms.  *may recheck uric acid next appointment.   Celesta Gentile, DPM

## 2015-05-12 ENCOUNTER — Other Ambulatory Visit: Payer: Self-pay | Admitting: Family Medicine

## 2015-05-16 ENCOUNTER — Ambulatory Visit: Payer: Medicare Other | Admitting: Podiatry

## 2015-05-16 NOTE — Telephone Encounter (Signed)
PLEASE SEND REFILL TO [facility] FOR GOUT MED'S SHE IS RUNNING OUT. [place]

## 2015-05-16 NOTE — Telephone Encounter (Signed)
05/16/15- PT STOPPED BY OFFICE, WANTS REFILL OF HER GOUT MED'S. HAS FUP ON 05/28/15 BUT RUNNING OUT OF MED. [place]

## 2015-05-17 ENCOUNTER — Telehealth: Payer: Self-pay | Admitting: *Deleted

## 2015-05-17 MED ORDER — COLCHICINE 0.6 MG PO TABS: 0.6000 mg | ORAL_TABLET | Freq: Every day | ORAL | Status: DC

## 2015-05-17 NOTE — Addendum Note (Signed)
Addended by: [place] Mon R on: 05/17/2015 08:13 AM   Modules accepted: Orders

## 2015-05-17 NOTE — Telephone Encounter (Signed)
Called in a refill of colchicine 0.6 mg x 10 days (15 tablets and no refill) and called patient and left a message. Martha [name]

## 2015-05-22 NOTE — Telephone Encounter (Signed)
CALLED PATIENT AND STATED PER DR WAGONER TO REFILL THE COLOCRYS AND CALLED INTO THE PHARMACY. Martha [name]

## 2015-05-25 ENCOUNTER — Other Ambulatory Visit: Payer: Self-pay | Admitting: Family Medicine

## 2015-05-28 ENCOUNTER — Ambulatory Visit (INDEPENDENT_AMBULATORY_CARE_PROVIDER_SITE_OTHER): Payer: Medicare Other | Admitting: Podiatry

## 2015-05-28 ENCOUNTER — Encounter: Payer: Self-pay | Admitting: Podiatry

## 2015-05-28 VITALS — BP 104/61 | HR 90 | Resp 18

## 2015-05-28 DIAGNOSIS — M109 Gout, unspecified: Secondary | ICD-10-CM

## 2015-05-28 DIAGNOSIS — M79671 Pain in right foot: Secondary | ICD-10-CM | POA: Diagnosis not present

## 2015-05-29 ENCOUNTER — Telehealth: Payer: Self-pay | Admitting: *Deleted

## 2015-05-29 LAB — URIC ACID: URIC ACID: 11.8 mg/dL — AB (ref 2.5–7.1)

## 2015-05-29 NOTE — Telephone Encounter (Signed)
CALLED PATIENT AND LEFT A MESSAGE STATING TO OUR OFFICE ABOUT HER BLOODWORK (URIC ACID WAS THE SAME AS IT WAS 2 WEEKS AGO) AND NEED TO FOLLOW UP WITH HER PRIMARY DOCTOR. [place] Do[place]

## 2015-05-30 ENCOUNTER — Telehealth: Payer: Self-pay | Admitting: Podiatry

## 2015-05-30 NOTE — Telephone Encounter (Signed)
Pt returned your call from yesterday.

## 2015-05-30 NOTE — Telephone Encounter (Signed)
CALLED PATIENT BACK AND WENT OVER THE BLOOD WORK WITH THE PATIENT AND STATED THAT SHE NEEDED TO FOLLOW UP WITH HER PRIMARY DOCTOR. Martha [name]

## 2015-06-01 NOTE — Progress Notes (Signed)
Patient ID: Martha [name], female   DOB: 1945/12/22, 69 y.o.   MRN: [id number]  Subjective: Patient presents the office they for follow-up evaluation of right foot pain and swelling, gout. She states that she is much better than she was last pointed. She states that she has tried to control her diet as well. She gets some occasional discomfort continuing on the medial aspect of her foot although it continues to improve and is nowhere near significant as what it was. No other complaints at this time in no acute changes. Denies any systemic complaints such as fevers, chills, nausea, vomiting. No calf pain, chest pain, shortness of breath.  Objective: AAO 3, NAD DP/PT pulses palpable 2/4, CRT less than 3 seconds At this time there is no tenderness palpation specifically overlying the right foot. There is some mild discomfort along the dorsal medial aspect of the midfoot although this appears to be significantly improved compared to last appointment. There is no overlying edema, erythema, increase in warmth. No open lesions or pre-ulcerative lesions. No pain with calf compression, swelling, warmth, erythema.  Assessment: 69 year old female with resolving gout right foot  Plan: -Treatment options discussed including all alternatives, risks, and complications -At this time we'll recheck uric acid level. -Continue a regular shoe gear as tolerated. Since her pain is significantly improved we'll hold off on further steroid injections. -Recommend her to follow up with her primary care physician as well for this. -Follow up if symptoms recur or sooner if there is any problems. The meantime call the office with any questions, concerns, change in symptoms.  Celesta Gentile, DPM

## 2015-07-15 ENCOUNTER — Other Ambulatory Visit: Payer: Self-pay | Admitting: Family Medicine

## 2015-07-27 ENCOUNTER — Emergency Department (HOSPITAL_COMMUNITY): Payer: Medicare Other

## 2015-07-27 ENCOUNTER — Inpatient Hospital Stay (HOSPITAL_COMMUNITY)
Admission: EM | Admit: 2015-07-27 | Discharge: 2015-08-05 | DRG: 025 | Disposition: A | Discharge status: Home / Self Care | Payer: Medicare Other | Attending: Neurosurgery | Admitting: Neurosurgery

## 2015-07-27 ENCOUNTER — Encounter (HOSPITAL_COMMUNITY): Payer: Self-pay | Admitting: Emergency Medicine

## 2015-07-27 DIAGNOSIS — Z85850 Personal history of malignant neoplasm of thyroid: Secondary | ICD-10-CM

## 2015-07-27 DIAGNOSIS — Z7984 Long term (current) use of oral hypoglycemic drugs: Secondary | ICD-10-CM | POA: Diagnosis not present

## 2015-07-27 DIAGNOSIS — L821 Other seborrheic keratosis: Secondary | ICD-10-CM | POA: Diagnosis present

## 2015-07-27 DIAGNOSIS — E890 Postprocedural hypothyroidism: Secondary | ICD-10-CM | POA: Diagnosis present

## 2015-07-27 DIAGNOSIS — E1122 Type 2 diabetes mellitus with diabetic chronic kidney disease: Secondary | ICD-10-CM | POA: Diagnosis present

## 2015-07-27 DIAGNOSIS — G40909 Epilepsy, unspecified, not intractable, without status epilepticus: Secondary | ICD-10-CM | POA: Diagnosis present

## 2015-07-27 DIAGNOSIS — E785 Hyperlipidemia, unspecified: Secondary | ICD-10-CM | POA: Diagnosis present

## 2015-07-27 DIAGNOSIS — I10 Essential (primary) hypertension: Secondary | ICD-10-CM | POA: Diagnosis not present

## 2015-07-27 DIAGNOSIS — I129 Hypertensive chronic kidney disease with stage 1 through stage 4 chronic kidney disease, or unspecified chronic kidney disease: Secondary | ICD-10-CM | POA: Diagnosis present

## 2015-07-27 DIAGNOSIS — R938 Abnormal findings on diagnostic imaging of other specified body structures: Secondary | ICD-10-CM | POA: Diagnosis present

## 2015-07-27 DIAGNOSIS — E038 Other specified hypothyroidism: Secondary | ICD-10-CM | POA: Diagnosis not present

## 2015-07-27 DIAGNOSIS — F17210 Nicotine dependence, cigarettes, uncomplicated: Secondary | ICD-10-CM | POA: Diagnosis not present

## 2015-07-27 DIAGNOSIS — Z981 Arthrodesis status: Secondary | ICD-10-CM | POA: Diagnosis not present

## 2015-07-27 DIAGNOSIS — G936 Cerebral edema: Secondary | ICD-10-CM | POA: Diagnosis present

## 2015-07-27 DIAGNOSIS — F329 Major depressive disorder, single episode, unspecified: Secondary | ICD-10-CM | POA: Diagnosis present

## 2015-07-27 DIAGNOSIS — E875 Hyperkalemia: Secondary | ICD-10-CM | POA: Diagnosis present

## 2015-07-27 DIAGNOSIS — N184 Chronic kidney disease, stage 4 (severe): Secondary | ICD-10-CM | POA: Diagnosis present

## 2015-07-27 DIAGNOSIS — R569 Unspecified convulsions: Secondary | ICD-10-CM

## 2015-07-27 DIAGNOSIS — A084 Viral intestinal infection, unspecified: Secondary | ICD-10-CM | POA: Diagnosis present

## 2015-07-27 DIAGNOSIS — C712 Malignant neoplasm of temporal lobe: Secondary | ICD-10-CM | POA: Diagnosis not present

## 2015-07-27 DIAGNOSIS — R220 Localized swelling, mass and lump, head: Secondary | ICD-10-CM | POA: Diagnosis not present

## 2015-07-27 DIAGNOSIS — Z87891 Personal history of nicotine dependence: Secondary | ICD-10-CM | POA: Diagnosis not present

## 2015-07-27 DIAGNOSIS — R918 Other nonspecific abnormal finding of lung field: Secondary | ICD-10-CM | POA: Diagnosis not present

## 2015-07-27 DIAGNOSIS — N183 Chronic kidney disease, stage 3 (moderate): Secondary | ICD-10-CM | POA: Diagnosis present

## 2015-07-27 DIAGNOSIS — Z66 Do not resuscitate: Secondary | ICD-10-CM | POA: Diagnosis present

## 2015-07-27 DIAGNOSIS — Z452 Encounter for adjustment and management of vascular access device: Secondary | ICD-10-CM

## 2015-07-27 DIAGNOSIS — E039 Hypothyroidism, unspecified: Secondary | ICD-10-CM | POA: Diagnosis present

## 2015-07-27 DIAGNOSIS — D496 Neoplasm of unspecified behavior of brain: Secondary | ICD-10-CM | POA: Diagnosis not present

## 2015-07-27 DIAGNOSIS — Z23 Encounter for immunization: Secondary | ICD-10-CM

## 2015-07-27 DIAGNOSIS — Z79899 Other long term (current) drug therapy: Secondary | ICD-10-CM

## 2015-07-27 DIAGNOSIS — R51 Headache: Secondary | ICD-10-CM | POA: Diagnosis not present

## 2015-07-27 DIAGNOSIS — G9389 Other specified disorders of brain: Secondary | ICD-10-CM | POA: Diagnosis present

## 2015-07-27 DIAGNOSIS — Z8603 Personal history of neoplasm of uncertain behavior: Secondary | ICD-10-CM | POA: Diagnosis not present

## 2015-07-27 DIAGNOSIS — T148 Other injury of unspecified body region: Secondary | ICD-10-CM | POA: Diagnosis not present

## 2015-07-27 DIAGNOSIS — I609 Nontraumatic subarachnoid hemorrhage, unspecified: Secondary | ICD-10-CM | POA: Diagnosis not present

## 2015-07-27 DIAGNOSIS — E1165 Type 2 diabetes mellitus with hyperglycemia: Secondary | ICD-10-CM | POA: Diagnosis present

## 2015-07-27 DIAGNOSIS — IMO0002 Reserved for concepts with insufficient information to code with codable children: Secondary | ICD-10-CM | POA: Diagnosis present

## 2015-07-27 DIAGNOSIS — Z9889 Other specified postprocedural states: Secondary | ICD-10-CM

## 2015-07-27 DIAGNOSIS — F32A Depression, unspecified: Secondary | ICD-10-CM | POA: Diagnosis present

## 2015-07-27 DIAGNOSIS — G939 Disorder of brain, unspecified: Secondary | ICD-10-CM | POA: Diagnosis not present

## 2015-07-27 LAB — I-STAT TROPONIN, ED: TROPONIN I, POC: 0 ng/mL (ref 0.00–0.08)

## 2015-07-27 LAB — TYPE AND SCREEN
ABO/RH(D): A POS
Antibody Screen: NEGATIVE

## 2015-07-27 LAB — GLUCOSE, CAPILLARY: GLUCOSE-CAPILLARY: 153 mg/dL — AB (ref 65–99)

## 2015-07-27 LAB — CBC WITH DIFFERENTIAL/PLATELET
Basophils Absolute: 0.1 10*3/uL (ref 0.0–0.1)
Basophils Relative: 1 %
Eosinophils Absolute: 0.2 10*3/uL (ref 0.0–0.7)
Eosinophils Relative: 2 %
HCT: 36.1 % (ref 36.0–46.0)
HEMOGLOBIN: 11.9 g/dL — AB (ref 12.0–15.0)
LYMPHS ABS: 1.7 10*3/uL (ref 0.7–4.0)
LYMPHS PCT: 17 %
MCH: 31.2 pg (ref 26.0–34.0)
MCHC: 33 g/dL (ref 30.0–36.0)
MCV: 94.8 fL (ref 78.0–100.0)
MONOS PCT: 6 %
Monocytes Absolute: 0.6 10*3/uL (ref 0.1–1.0)
NEUTROS PCT: 74 %
Neutro Abs: 7.6 10*3/uL (ref 1.7–7.7)
Platelets: 187 10*3/uL (ref 150–400)
RBC: 3.81 MIL/uL — AB (ref 3.87–5.11)
RDW: 13.5 % (ref 11.5–15.5)
WBC: 10.2 10*3/uL (ref 4.0–10.5)

## 2015-07-27 LAB — BASIC METABOLIC PANEL
Anion gap: 9 (ref 5–15)
BUN: 22 mg/dL — AB (ref 6–20)
CHLORIDE: 106 mmol/L (ref 101–111)
CO2: 24 mmol/L (ref 22–32)
CREATININE: 1.14 mg/dL — AB (ref 0.44–1.00)
Calcium: 9.5 mg/dL (ref 8.9–10.3)
GFR calc Af Amer: 56 mL/min — ABNORMAL LOW (ref >60)
GFR calc non Af Amer: 48 mL/min — ABNORMAL LOW (ref >60)
Glucose, Bld: 141 mg/dL — ABNORMAL HIGH (ref 65–99)
POTASSIUM: 5.6 mmol/L — AB (ref 3.5–5.1)
SODIUM: 139 mmol/L (ref 135–145)

## 2015-07-27 LAB — URINALYSIS, ROUTINE W REFLEX MICROSCOPIC
Bilirubin Urine: NEGATIVE
Glucose, UA: NEGATIVE mg/dL
Hgb urine dipstick: NEGATIVE
KETONES UR: NEGATIVE mg/dL
Nitrite: NEGATIVE
PROTEIN: NEGATIVE mg/dL
Specific Gravity, Urine: 1.019 (ref 1.005–1.030)
pH: 5 (ref 5.0–8.0)

## 2015-07-27 LAB — HEPATIC FUNCTION PANEL
ALBUMIN: 3.9 g/dL (ref 3.5–5.0)
ALT: 55 U/L — ABNORMAL HIGH (ref 14–54)
AST: 40 U/L (ref 15–41)
Alkaline Phosphatase: 71 U/L (ref 38–126)
Bilirubin, Direct: 0.1 mg/dL (ref 0.1–0.5)
Indirect Bilirubin: 0.8 mg/dL (ref 0.3–0.9)
TOTAL PROTEIN: 6.4 g/dL — AB (ref 6.5–8.1)
Total Bilirubin: 0.9 mg/dL (ref 0.3–1.2)

## 2015-07-27 LAB — PROTIME-INR
INR: 0.99 (ref 0.00–1.49)
Prothrombin Time: 13.3 seconds (ref 11.6–15.2)

## 2015-07-27 LAB — URINE MICROSCOPIC-ADD ON: RBC / HPF: NONE SEEN RBC/hpf (ref 0–5)

## 2015-07-27 LAB — I-STAT CG4 LACTIC ACID, ED: LACTIC ACID, VENOUS: 2.21 mmol/L — AB (ref 0.5–2.0)

## 2015-07-27 LAB — ABO/RH: ABO/RH(D): A POS

## 2015-07-27 MED ORDER — HYDROCODONE-ACETAMINOPHEN 5-325 MG PO TABS
1.0000 | ORAL_TABLET | Freq: Four times a day (QID) | ORAL | Status: DC | PRN
  Administered 2015-07-28 – 2015-08-04 (×9): 1 via ORAL
  Filled 2015-07-27 (×10): qty 1

## 2015-07-27 MED ORDER — AMLODIPINE BESYLATE 5 MG PO TABS
5.0000 mg | ORAL_TABLET | Freq: Every morning | ORAL | Status: DC
  Administered 2015-07-28 – 2015-08-05 (×8): 5 mg via ORAL
  Filled 2015-07-27 (×8): qty 1

## 2015-07-27 MED ORDER — LISINOPRIL-HYDROCHLOROTHIAZIDE 20-25 MG PO TABS: 1.0000 | ORAL_TABLET | Freq: Every day | ORAL | Status: DC

## 2015-07-27 MED ORDER — LEVETIRACETAM 500 MG PO TABS
500.0000 mg | ORAL_TABLET | Freq: Two times a day (BID) | ORAL | Status: DC
  Administered 2015-07-28 – 2015-08-05 (×17): 500 mg via ORAL
  Filled 2015-07-27 (×17): qty 1

## 2015-07-27 MED ORDER — LEVOTHYROXINE SODIUM 25 MCG PO TABS: 137.0000 ug | ORAL_TABLET | Freq: Every day | ORAL | Status: DC

## 2015-07-27 MED ORDER — LORAZEPAM 2 MG/ML IJ SOLN: 2.0000 mg | INTRAMUSCULAR | Status: AC

## 2015-07-27 MED ORDER — LEVOTHYROXINE SODIUM 125 MCG PO TABS
125.0000 ug | ORAL_TABLET | Freq: Every day | ORAL | Status: DC
  Administered 2015-07-29 – 2015-08-01 (×4): 125 ug via ORAL
  Filled 2015-07-27 (×6): qty 1

## 2015-07-27 MED ORDER — PRAVASTATIN SODIUM 40 MG PO TABS
40.0000 mg | ORAL_TABLET | Freq: Every day | ORAL | Status: DC
  Administered 2015-07-27 – 2015-08-04 (×9): 40 mg via ORAL
  Filled 2015-07-27 (×9): qty 1

## 2015-07-27 MED ORDER — ONDANSETRON HCL 4 MG/2ML IJ SOLN
4.0000 mg | Freq: Four times a day (QID) | INTRAMUSCULAR | Status: DC | PRN
  Administered 2015-07-27: 4 mg via INTRAVENOUS
  Filled 2015-07-27: qty 2

## 2015-07-27 MED ORDER — HYDROCHLOROTHIAZIDE 25 MG PO TABS
25.0000 mg | ORAL_TABLET | Freq: Every day | ORAL | Status: DC
  Administered 2015-07-28 – 2015-08-05 (×8): 25 mg via ORAL
  Filled 2015-07-27 (×8): qty 1

## 2015-07-27 MED ORDER — SERTRALINE HCL 50 MG PO TABS
50.0000 mg | ORAL_TABLET | Freq: Every day | ORAL | Status: DC
  Administered 2015-07-28 – 2015-08-05 (×8): 50 mg via ORAL
  Filled 2015-07-27 (×8): qty 1

## 2015-07-27 MED ORDER — LISINOPRIL 20 MG PO TABS
20.0000 mg | ORAL_TABLET | Freq: Every day | ORAL | Status: DC
  Administered 2015-07-28 – 2015-08-05 (×8): 20 mg via ORAL
  Filled 2015-07-27 (×8): qty 1

## 2015-07-27 MED ORDER — SODIUM CHLORIDE 0.9 % IV BOLUS (SEPSIS)
500.0000 mL | Freq: Once | INTRAVENOUS | Status: AC
  Administered 2015-07-27: 500 mL via INTRAVENOUS

## 2015-07-27 MED ORDER — ONDANSETRON HCL 4 MG PO TABS: 4.0000 mg | ORAL_TABLET | Freq: Four times a day (QID) | ORAL | Status: DC | PRN

## 2015-07-27 MED ORDER — DEXAMETHASONE SODIUM PHOSPHATE 4 MG/ML IJ SOLN
4.0000 mg | Freq: Four times a day (QID) | INTRAMUSCULAR | Status: DC
  Administered 2015-07-28 – 2015-08-04 (×29): 4 mg via INTRAVENOUS
  Filled 2015-07-27 (×29): qty 1

## 2015-07-27 MED ORDER — SODIUM CHLORIDE 0.9 % IV SOLN
1000.0000 mg | Freq: Once | INTRAVENOUS | Status: AC
  Administered 2015-07-27: 1000 mg via INTRAVENOUS
  Filled 2015-07-27: qty 10

## 2015-07-27 MED ORDER — ENOXAPARIN SODIUM 40 MG/0.4ML ~~LOC~~ SOLN
40.0000 mg | SUBCUTANEOUS | Status: DC
  Administered 2015-07-27 – 2015-08-04 (×8): 40 mg via SUBCUTANEOUS
  Filled 2015-07-27 (×8): qty 0.4

## 2015-07-27 MED ORDER — INSULIN ASPART 100 UNIT/ML ~~LOC~~ SOLN
0.0000 [IU] | Freq: Three times a day (TID) | SUBCUTANEOUS | Status: DC
  Administered 2015-07-28: 2 [IU] via SUBCUTANEOUS
  Administered 2015-07-28: 3 [IU] via SUBCUTANEOUS
  Administered 2015-07-28 – 2015-07-29 (×4): 2 [IU] via SUBCUTANEOUS
  Administered 2015-07-30: 1 [IU] via SUBCUTANEOUS
  Administered 2015-07-30 (×2): 2 [IU] via SUBCUTANEOUS
  Administered 2015-07-31: 5 [IU] via SUBCUTANEOUS
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
## 2015-07-28 ENCOUNTER — Inpatient Hospital Stay (HOSPITAL_COMMUNITY): Payer: Medicare Other

## 2015-07-28 DIAGNOSIS — N183 Chronic kidney disease, stage 3 (moderate): Secondary | ICD-10-CM

## 2015-07-28 DIAGNOSIS — R569 Unspecified convulsions: Secondary | ICD-10-CM

## 2015-07-28 DIAGNOSIS — G939 Disorder of brain, unspecified: Secondary | ICD-10-CM

## 2015-07-28 LAB — LIPASE, BLOOD: Lipase: 60 U/L — ABNORMAL HIGH (ref 11–51)

## 2015-07-28 LAB — GLUCOSE, CAPILLARY
GLUCOSE-CAPILLARY: 200 mg/dL — AB (ref 65–99)
GLUCOSE-CAPILLARY: 170 mg/dL — AB (ref 65–99)
Glucose-Capillary: 210 mg/dL — ABNORMAL HIGH (ref 65–99)
Glucose-Capillary: 180 mg/dL — ABNORMAL HIGH (ref 65–99)

## 2015-07-28 LAB — BASIC METABOLIC PANEL
Anion gap: 9 (ref 5–15)
BUN: 20 mg/dL (ref 6–20)
CALCIUM: 9.2 mg/dL (ref 8.9–10.3)
CO2: 23 mmol/L (ref 22–32)
Chloride: 105 mmol/L (ref 101–111)
Creatinine, Ser: 1.07 mg/dL — ABNORMAL HIGH (ref 0.44–1.00)
GFR calc Af Amer: 60 mL/min — ABNORMAL LOW (ref >60)
GFR, EST NON AFRICAN AMERICAN: 52 mL/min — AB (ref >60)
GLUCOSE: 194 mg/dL — AB (ref 65–99)
Potassium: 4.3 mmol/L (ref 3.5–5.1)
Sodium: 137 mmol/L (ref 135–145)

## 2015-07-28 LAB — HIV ANTIBODY (ROUTINE TESTING W REFLEX): HIV SCREEN 4TH GENERATION: NONREACTIVE

## 2015-07-28 MED ORDER — GADOBENATE DIMEGLUMINE 529 MG/ML IV SOLN
17.0000 mL | Freq: Once | INTRAVENOUS | Status: AC | PRN
  Administered 2015-07-28: 17 mL via INTRAVENOUS

## 2015-07-28 NOTE — Progress Notes (Signed)
Pt educated on C. Diff precaution with verbalized understanding.  Pt haven't had loose stool yet during shift. Pt stated previous loose watery stool. Will continue to monitor.
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
## 2015-07-28 NOTE — Progress Notes (Signed)
Utilization Review Completed.Jonavin Seder T12/18/2016  

## 2015-07-29 DIAGNOSIS — I10 Essential (primary) hypertension: Secondary | ICD-10-CM

## 2015-07-29 DIAGNOSIS — E038 Other specified hypothyroidism: Secondary | ICD-10-CM

## 2015-07-29 LAB — BASIC METABOLIC PANEL
ANION GAP: 11 (ref 5–15)
BUN: 29 mg/dL — ABNORMAL HIGH (ref 6–20)
CALCIUM: 8.8 mg/dL — AB (ref 8.9–10.3)
CO2: 24 mmol/L (ref 22–32)
CREATININE: 1.3 mg/dL — AB (ref 0.44–1.00)
Chloride: 104 mmol/L (ref 101–111)
GFR calc non Af Amer: 41 mL/min — ABNORMAL LOW (ref >60)
GFR, EST AFRICAN AMERICAN: 47 mL/min — AB (ref >60)
GLUCOSE: 222 mg/dL — AB (ref 65–99)
POTASSIUM: 4 mmol/L (ref 3.5–5.1)
SODIUM: 139 mmol/L (ref 135–145)

## 2015-07-29 LAB — GLUCOSE, CAPILLARY
GLUCOSE-CAPILLARY: 195 mg/dL — AB (ref 65–99)
Glucose-Capillary: 199 mg/dL — ABNORMAL HIGH (ref 65–99)
Glucose-Capillary: 185 mg/dL — ABNORMAL HIGH (ref 65–99)
Glucose-Capillary: 195 mg/dL — ABNORMAL HIGH (ref 65–99)

## 2015-07-29 MED ORDER — INSULIN ASPART 100 UNIT/ML ~~LOC~~ SOLN
2.0000 [IU] | Freq: Three times a day (TID) | SUBCUTANEOUS | Status: DC
  Administered 2015-07-29 – 2015-08-05 (×19): 2 [IU] via SUBCUTANEOUS

## 2015-07-29 NOTE — Progress Notes (Signed)
No issues overnight. Pt doing well, improved HA, no SZ reported.  EXAM:  BP 107/74 mmHg  Pulse 70  Temp(Src) 98.3 F (36.8 C) (Oral)  Resp 18  Ht 5\' 1"  (1.549 m)  Wt 77.656 kg (171 lb 3.2 oz)  BMI 32.36 kg/m2  SpO2 97%  Awake, alert, oriented  Speech fluent, appropriate  CN grossly intact  5/5 BUE/BLE   IMPRESSION:  69 y.o. 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   PLAN: - Cont Keppra - Cont decadron - Will plan on surgical resection on Friday  I have reviewed the MRI findings with the patient and her friend who is a pediatrician. Most likely diagnosis of glioma was discussed. The need for resection was reviewed both for relief of mass effect and for diagnosis. She appeared to understand our discussion and is willing to proceed. She did note that although she does have a DNR/DNI she would suspend this for the planned surgery.
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
## 2015-07-30 DIAGNOSIS — E785 Hyperlipidemia, unspecified: Secondary | ICD-10-CM

## 2015-07-30 DIAGNOSIS — E1165 Type 2 diabetes mellitus with hyperglycemia: Secondary | ICD-10-CM

## 2015-07-30 LAB — GLUCOSE, CAPILLARY
GLUCOSE-CAPILLARY: 200 mg/dL — AB (ref 65–99)
Glucose-Capillary: 165 mg/dL — ABNORMAL HIGH (ref 65–99)
Glucose-Capillary: 150 mg/dL — ABNORMAL HIGH (ref 65–99)
Glucose-Capillary: 335 mg/dL — ABNORMAL HIGH (ref 65–99)

## 2015-07-30 LAB — BASIC METABOLIC PANEL
ANION GAP: 11 (ref 5–15)
BUN: 41 mg/dL — ABNORMAL HIGH (ref 6–20)
CHLORIDE: 106 mmol/L (ref 101–111)
CO2: 24 mmol/L (ref 22–32)
CREATININE: 1.31 mg/dL — AB (ref 0.44–1.00)
Calcium: 9 mg/dL (ref 8.9–10.3)
GFR calc non Af Amer: 41 mL/min — ABNORMAL LOW (ref >60)
GFR, EST AFRICAN AMERICAN: 47 mL/min — AB (ref >60)
Glucose, Bld: 239 mg/dL — ABNORMAL HIGH (ref 65–99)
POTASSIUM: 3.8 mmol/L (ref 3.5–5.1)
SODIUM: 141 mmol/L (ref 135–145)

## 2015-07-30 LAB — CBC
HCT: 37.2 % (ref 36.0–46.0)
HEMOGLOBIN: 12.4 g/dL (ref 12.0–15.0)
MCH: 30.9 pg (ref 26.0–34.0)
MCHC: 33.3 g/dL (ref 30.0–36.0)
MCV: 92.8 fL (ref 78.0–100.0)
Platelets: 284 10*3/uL (ref 150–400)
RBC: 4.01 MIL/uL (ref 3.87–5.11)
RDW: 13.3 % (ref 11.5–15.5)
WBC: 12.6 10*3/uL — AB (ref 4.0–10.5)

## 2015-07-30 NOTE — Care Management Important Message (Signed)
Important Message  Patient Details  Name: Martha [name] MRN: [id number] Date of Birth: 27-Oct-1945   Medicare Important Message Given:  Yes    Lara Palinkas P Brenen Beigel 07/30/2015, 1:02 PM
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
## 2015-07-30 NOTE — Progress Notes (Signed)
No issues overnight. Pt doing well, minimal HA.  EXAM:  BP 136/82 mmHg  Pulse 73  Temp(Src) 98.2 F (36.8 C) (Oral)  Resp 17  Ht 5\' 1"  (1.549 m)  Wt 77.656 kg (171 lb 3.2 oz)  BMI 32.36 kg/m2  SpO2 95%  Awake, alert, oriented  Speech fluent, appropriate  CN grossly intact  5/5 BUE/BLE   IMPRESSION:  69 y.o. female with right temporal mass, plan on resection Fri  PLAN: - Cont current mgmt with decadron and Keppra.

## 2015-07-30 NOTE — Progress Notes (Signed)
CM received a call from attending MD relaying patient concerns about her continued inpatient stay.  CM met with patient to discuss the process of utilization review and getting continued stay approval. Patient verbalized understanding that her insurance company makes the final determination of approval versus denial, but the hospital works to review each patient's chart in an effort to remain compliant with patient's insurance guidelines.     Lorne Skeens RN, MSN [phone]
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
## 2015-07-30 NOTE — Care Management Note (Signed)
Case Management Note  Patient Details  Name: Martha [name] MRN: [id number] Date of Birth: 15-Apr-1946  Subjective/Objective:                    Action/Plan: Patient was admitted with new onset seizure, newly diagnosed brain mass and diabetes mellitus.  Will follow for discharge needs. Consult was received regarding patient's new insulin.  Benefits check was initiated.  Awaiting results.  Expected Discharge Date:                  Expected Discharge Plan:     In-House Referral:     Discharge planning Services     Post Acute Care Choice:    Choice offered to:     DME Arranged:    DME Agency:     HH Arranged:    HH Agency:     Status of Service:  In process, will continue to follow  Medicare Important Message Given:    Date Medicare IM Given:    Medicare IM give by:    Date Additional Medicare IM Given:    Additional Medicare Important Message give by:     If discussed at [place] of Stay Meetings, dates discussed:    Additional CommentsRolm Baptise, RN 07/30/2015, 10:02 AM [phone]

## 2015-07-31 ENCOUNTER — Inpatient Hospital Stay (HOSPITAL_COMMUNITY): Payer: Medicare Other

## 2015-07-31 LAB — GLUCOSE, CAPILLARY
GLUCOSE-CAPILLARY: 254 mg/dL — AB (ref 65–99)
GLUCOSE-CAPILLARY: 177 mg/dL — AB (ref 65–99)
Glucose-Capillary: 145 mg/dL — ABNORMAL HIGH (ref 65–99)
Glucose-Capillary: 180 mg/dL — ABNORMAL HIGH (ref 65–99)

## 2015-07-31 MED ORDER — INSULIN ASPART 100 UNIT/ML ~~LOC~~ SOLN
0.0000 [IU] | Freq: Every day | SUBCUTANEOUS | Status: DC
  Administered 2015-08-02: 4 [IU] via SUBCUTANEOUS

## 2015-07-31 MED ORDER — INSULIN ASPART 100 UNIT/ML ~~LOC~~ SOLN
0.0000 [IU] | Freq: Three times a day (TID) | SUBCUTANEOUS | Status: DC
  Administered 2015-07-31: 11 [IU] via SUBCUTANEOUS
  Administered 2015-07-31: 3 [IU] via SUBCUTANEOUS
  Administered 2015-07-31: 4 [IU] via SUBCUTANEOUS
  Administered 2015-08-01: 7 [IU] via SUBCUTANEOUS
  Administered 2015-08-01: 4 [IU] via SUBCUTANEOUS
  Administered 2015-08-01: 3 [IU] via SUBCUTANEOUS
  Administered 2015-08-02 – 2015-08-03 (×3): 4 [IU] via SUBCUTANEOUS
  Administered 2015-08-03 – 2015-08-04 (×2): 3 [IU] via SUBCUTANEOUS
  Administered 2015-08-04 – 2015-08-05 (×2): 4 [IU] via SUBCUTANEOUS

## 2015-07-31 NOTE — Progress Notes (Signed)
Inpatient Diabetes Program Recommendations  AACE/ADA: New Consensus Statement on Inpatient Glycemic Control (2015)  Target Ranges:  Prepandial:   less than 140 mg/dL      Peak postprandial:   less than 180 mg/dL (1-2 hours)      Critically ill patients:  140 - 180 mg/dL   Review of Glycemic Control  Diabetes history: DM 2 Outpatient Diabetes medications: Metformin 1,000 mg BID Current orders for Inpatient glycemic control: Novolog Resistant + HS + Novolog 2 units meal coverage  Inpatient Diabetes Program Recommendations: Insulin - Basal: Glucose increased overnight into the 200-300 range. Patient on same dose of Decadron 4 mg 4x/day. May want to consider adding low dose basal insulin Lantus 10 units Q 24hrs.  Thanks,  Tama Headings RN, MSN, [facility] Inpatient Diabetes Coordinator Team Pager [phone] (8a-5p)

## 2015-07-31 NOTE — Progress Notes (Signed)
No issues overnight. Pt has no significant c/o this am  EXAM:  BP 120/60 mmHg  Pulse 69  Temp(Src) 97.4 F (36.3 C) (Oral)  Resp 20  Ht 5\' 1"  (1.549 m)  Wt 77.656 kg (171 lb 3.2 oz)  BMI 32.36 kg/m2  SpO2 97%  Awake, alert, oriented  Speech fluent, appropriate  CN grossly intact  5/5 BUE/BLE   IMPRESSION:  69 y.o. 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

## 2015-08-01 LAB — GLUCOSE, CAPILLARY
GLUCOSE-CAPILLARY: 205 mg/dL — AB (ref 65–99)
GLUCOSE-CAPILLARY: 172 mg/dL — AB (ref 65–99)
Glucose-Capillary: 134 mg/dL — ABNORMAL HIGH (ref 65–99)
Glucose-Capillary: 200 mg/dL — ABNORMAL HIGH (ref 65–99)

## 2015-08-01 NOTE — Progress Notes (Signed)
No issues overnight. Pt doing well, no significant complaint.  EXAM:  BP 124/70 mmHg  Pulse 65  Temp(Src) 97.9 F (36.6 C) (Oral)  Resp 16  Ht 5\' 1"  (1.549 m)  Wt 77.656 kg (171 lb 3.2 oz)  BMI 32.36 kg/m2  SpO2 93%  Awake, alert, oriented  Speech fluent, appropriate  CN grossly intact  5/5 BUE/BLE   IMPRESSION:  69 y.o. 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  PLAN: - Will proceed with surgical resection tomorrow am  I have discussed the planned surgery at length with the patient. Risks of surgery were reviewed including risk of aphasia, weakness/paralysis, SZ, hydrocephalus, bleeding, and infection. All questions were answered, and she provided consent to proceed.

## 2015-08-02 ENCOUNTER — Encounter (HOSPITAL_COMMUNITY)
Admission: EM | Disposition: A | Discharge status: Home / Self Care | Payer: Self-pay | Source: Home / Self Care | Attending: Neurosurgery

## 2015-08-02 ENCOUNTER — Inpatient Hospital Stay (HOSPITAL_COMMUNITY): Payer: Medicare Other | Admitting: Anesthesiology

## 2015-08-02 ENCOUNTER — Encounter (HOSPITAL_COMMUNITY): Payer: Self-pay | Admitting: Certified Registered Nurse Anesthetist

## 2015-08-02 ENCOUNTER — Inpatient Hospital Stay (HOSPITAL_COMMUNITY): Payer: Medicare Other

## 2015-08-02 DIAGNOSIS — Z9889 Other specified postprocedural states: Secondary | ICD-10-CM

## 2015-08-02 HISTORY — PX: APPLICATION OF CRANIAL NAVIGATION: SHX6578

## 2015-08-02 HISTORY — PX: CRANIOTOMY: SHX93

## 2015-08-02 LAB — GLUCOSE, CAPILLARY
GLUCOSE-CAPILLARY: 213 mg/dL — AB (ref 65–99)
GLUCOSE-CAPILLARY: 242 mg/dL — AB (ref 65–99)
GLUCOSE-CAPILLARY: 212 mg/dL — AB (ref 65–99)
GLUCOSE-CAPILLARY: 160 mg/dL — AB (ref 65–99)
Glucose-Capillary: 203 mg/dL — ABNORMAL HIGH (ref 65–99)
Glucose-Capillary: 158 mg/dL — ABNORMAL HIGH (ref 65–99)

## 2015-08-02 LAB — SURGICAL PCR SCREEN
MRSA, PCR: NEGATIVE
Staphylococcus aureus: NEGATIVE

## 2015-08-02 LAB — PREPARE RBC (CROSSMATCH)

## 2015-08-02 SURGERY — CRANIOTOMY TUMOR EXCISION
Anesthesia: General | Site: Head | Laterality: Right

## 2015-08-02 MED ORDER — ONDANSETRON HCL 4 MG/2ML IJ SOLN
INTRAMUSCULAR | Status: AC
  Filled 2015-08-02: qty 2

## 2015-08-02 MED ORDER — PROPOFOL 10 MG/ML IV BOLUS
INTRAVENOUS | Status: AC
  Filled 2015-08-02: qty 20

## 2015-08-02 MED ORDER — LABETALOL HCL 5 MG/ML IV SOLN: 10.0000 mg | INTRAVENOUS | Status: DC | PRN

## 2015-08-02 MED ORDER — MICROFIBRILLAR COLL HEMOSTAT EX PADS
MEDICATED_PAD | CUTANEOUS | Status: DC | PRN
  Administered 2015-08-02: 1 via TOPICAL

## 2015-08-02 MED ORDER — BISACODYL 10 MG RE SUPP: 10.0000 mg | Freq: Every day | RECTAL | Status: DC | PRN

## 2015-08-02 MED ORDER — FENTANYL CITRATE (PF) 100 MCG/2ML IJ SOLN
INTRAMUSCULAR | Status: DC | PRN
  Administered 2015-08-02: 150 ug via INTRAVENOUS

## 2015-08-02 MED ORDER — ROCURONIUM BROMIDE 100 MG/10ML IV SOLN
INTRAVENOUS | Status: DC | PRN
  Administered 2015-08-02 (×3): 10 mg via INTRAVENOUS
  Administered 2015-08-02: 50 mg via INTRAVENOUS

## 2015-08-02 MED ORDER — INSULIN ASPART 100 UNIT/ML ~~LOC~~ SOLN
SUBCUTANEOUS | Status: AC
  Filled 2015-08-02: qty 1

## 2015-08-02 MED ORDER — SODIUM CHLORIDE 0.9 % IJ SOLN
3.0000 mL | Freq: Two times a day (BID) | INTRAMUSCULAR | Status: DC
  Administered 2015-08-02 – 2015-08-04 (×4): 3 mL via INTRAVENOUS

## 2015-08-02 MED ORDER — SODIUM CHLORIDE 0.9 % IV SOLN
INTRAVENOUS | Status: DC
  Administered 2015-08-02 – 2015-08-04 (×3): via INTRAVENOUS

## 2015-08-02 MED ORDER — PNEUMOCOCCAL VAC POLYVALENT 25 MCG/0.5ML IJ INJ
0.5000 mL | INJECTION | INTRAMUSCULAR | Status: AC
  Administered 2015-08-03: 0.5 mL via INTRAMUSCULAR
  Filled 2015-08-02: qty 0.5

## 2015-08-02 MED ORDER — ARTIFICIAL TEARS OP OINT
TOPICAL_OINTMENT | OPHTHALMIC | Status: DC | PRN
  Administered 2015-08-02: 1 via OPHTHALMIC

## 2015-08-02 MED ORDER — SUGAMMADEX SODIUM 200 MG/2ML IV SOLN
INTRAVENOUS | Status: DC | PRN
  Administered 2015-08-02: 150 mg via INTRAVENOUS

## 2015-08-02 MED ORDER — BUPIVACAINE HCL (PF) 0.5 % IJ SOLN
INTRAMUSCULAR | Status: DC | PRN
  Administered 2015-08-02: 5 mL

## 2015-08-02 MED ORDER — SODIUM CHLORIDE 0.9 % IJ SOLN
3.0000 mL | Freq: Two times a day (BID) | INTRAMUSCULAR | Status: DC
  Administered 2015-08-02 – 2015-08-03 (×2): 3 mL via INTRAVENOUS
  Administered 2015-08-03: 22:00:00 via INTRAVENOUS
  Administered 2015-08-04: 3 mL via INTRAVENOUS

## 2015-08-02 MED ORDER — PROPOFOL 10 MG/ML IV BOLUS
INTRAVENOUS | Status: DC | PRN
  Administered 2015-08-02: 100 mg via INTRAVENOUS
  Administered 2015-08-02: 50 mg via INTRAVENOUS
  Administered 2015-08-02: 30 mg via INTRAVENOUS
  Administered 2015-08-02: 20 mg via INTRAVENOUS

## 2015-08-02 MED ORDER — INSULIN ASPART 100 UNIT/ML ~~LOC~~ SOLN
SUBCUTANEOUS | Status: DC | PRN
  Administered 2015-08-02: 10 [IU] via INTRAVENOUS

## 2015-08-02 MED ORDER — LIDOCAINE HCL (CARDIAC) 20 MG/ML IV SOLN
INTRAVENOUS | Status: DC | PRN
  Administered 2015-08-02: 100 mg via INTRAVENOUS

## 2015-08-02 MED ORDER — PROMETHAZINE HCL 25 MG/ML IJ SOLN
6.2500 mg | INTRAMUSCULAR | Status: DC | PRN
Start: 2015-08-02 — End: 2015-08-02

## 2015-08-02 MED ORDER — SODIUM CHLORIDE 0.9 % IV SOLN
INTRAVENOUS | Status: DC | PRN
  Administered 2015-08-02: 08:00:00 via INTRAVENOUS

## 2015-08-02 MED ORDER — ESMOLOL HCL 100 MG/10ML IV SOLN
INTRAVENOUS | Status: DC | PRN
  Administered 2015-08-02 (×2): 20 mg via INTRAVENOUS

## 2015-08-02 MED ORDER — VANCOMYCIN HCL IN DEXTROSE 1-5 GM/200ML-% IV SOLN
INTRAVENOUS | Status: AC
  Administered 2015-08-02: 1000 mg via INTRAVENOUS
  Filled 2015-08-02: qty 200

## 2015-08-02 MED ORDER — DOCUSATE SODIUM 100 MG PO CAPS
100.0000 mg | ORAL_CAPSULE | Freq: Two times a day (BID) | ORAL | Status: DC
  Administered 2015-08-02 – 2015-08-05 (×6): 100 mg via ORAL
  Filled 2015-08-02 (×6): qty 1

## 2015-08-02 MED ORDER — LIDOCAINE HCL (CARDIAC) 20 MG/ML IV SOLN
INTRAVENOUS | Status: AC
  Filled 2015-08-02: qty 5

## 2015-08-02 MED ORDER — SODIUM CHLORIDE 0.9 % IJ SOLN
10.0000 mL | Freq: Two times a day (BID) | INTRAMUSCULAR | Status: DC
  Administered 2015-08-02 – 2015-08-03 (×3): 10 mL via INTRAVENOUS
  Administered 2015-08-04: 20 mL via INTRAVENOUS

## 2015-08-02 MED ORDER — FENTANYL CITRATE (PF) 100 MCG/2ML IJ SOLN
25.0000 ug | INTRAMUSCULAR | Status: DC | PRN
  Administered 2015-08-02: 25 ug via INTRAVENOUS

## 2015-08-02 MED ORDER — EPHEDRINE SULFATE 50 MG/ML IJ SOLN
INTRAMUSCULAR | Status: AC
  Filled 2015-08-02: qty 1

## 2015-08-02 MED ORDER — SUGAMMADEX SODIUM 200 MG/2ML IV SOLN
INTRAVENOUS | Status: AC
  Filled 2015-08-02: qty 2

## 2015-08-02 MED ORDER — HYDROMORPHONE HCL 1 MG/ML IJ SOLN
INTRAMUSCULAR | Status: AC
  Filled 2015-08-02: qty 1

## 2015-08-02 MED ORDER — MORPHINE SULFATE (PF) 2 MG/ML IV SOLN
1.0000 mg | INTRAVENOUS | Status: DC | PRN
  Administered 2015-08-03 – 2015-08-04 (×2): 2 mg via INTRAVENOUS
  Filled 2015-08-02 (×2): qty 1

## 2015-08-02 MED ORDER — INSULIN ASPART 100 UNIT/ML ~~LOC~~ SOLN
SUBCUTANEOUS | Status: DC
Start: 2015-08-02 — End: 2015-08-02
  Filled 2015-08-02: qty 1

## 2015-08-02 MED ORDER — ONDANSETRON HCL 4 MG/2ML IJ SOLN
INTRAMUSCULAR | Status: DC | PRN
  Administered 2015-08-02: 4 mg via INTRAVENOUS

## 2015-08-02 MED ORDER — ACETAMINOPHEN 650 MG RE SUPP: 650.0000 mg | RECTAL | Status: DC | PRN

## 2015-08-02 MED ORDER — FENTANYL CITRATE (PF) 100 MCG/2ML IJ SOLN
INTRAMUSCULAR | Status: AC
  Filled 2015-08-02: qty 2

## 2015-08-02 MED ORDER — FENTANYL CITRATE (PF) 250 MCG/5ML IJ SOLN
INTRAMUSCULAR | Status: AC
  Filled 2015-08-02: qty 5

## 2015-08-02 MED ORDER — NITROGLYCERIN IN D5W 200-5 MCG/ML-% IV SOLN
0.0000 ug/min | INTRAVENOUS | Status: DC
  Filled 2015-08-02 (×2): qty 250

## 2015-08-02 MED ORDER — THROMBIN 20000 UNITS EX SOLR
CUTANEOUS | Status: DC | PRN
  Administered 2015-08-02: 20 mL via TOPICAL

## 2015-08-02 MED ORDER — MANNITOL 25 % IV SOLN
INTRAVENOUS | Status: DC | PRN
  Administered 2015-08-02 (×2): 12.5 g via INTRAVENOUS

## 2015-08-02 MED ORDER — ROCURONIUM BROMIDE 50 MG/5ML IV SOLN
INTRAVENOUS | Status: AC
  Filled 2015-08-02: qty 1

## 2015-08-02 MED ORDER — DEXAMETHASONE SODIUM PHOSPHATE 10 MG/ML IJ SOLN
INTRAMUSCULAR | Status: AC
  Filled 2015-08-02: qty 1

## 2015-08-02 MED ORDER — LABETALOL HCL 5 MG/ML IV SOLN
INTRAVENOUS | Status: AC
  Filled 2015-08-02: qty 4

## 2015-08-02 MED ORDER — SODIUM CHLORIDE 0.9 % IV SOLN
0.0125 ug/kg/min | INTRAVENOUS | Status: AC
  Administered 2015-08-02: .1 ug/kg/min via INTRAVENOUS
  Filled 2015-08-02 (×2): qty 2000

## 2015-08-02 MED ORDER — DEXAMETHASONE SODIUM PHOSPHATE 10 MG/ML IJ SOLN
INTRAMUSCULAR | Status: DC | PRN
  Administered 2015-08-02: 10 mg via INTRAVENOUS

## 2015-08-02 MED ORDER — SUCCINYLCHOLINE CHLORIDE 20 MG/ML IJ SOLN
INTRAMUSCULAR | Status: AC
  Filled 2015-08-02: qty 1

## 2015-08-02 MED ORDER — LIDOCAINE-EPINEPHRINE 1 %-1:100000 IJ SOLN
INTRAMUSCULAR | Status: DC | PRN
  Administered 2015-08-02: 5 mL

## 2015-08-02 MED ORDER — SUCCINYLCHOLINE CHLORIDE 20 MG/ML IJ SOLN
INTRAMUSCULAR | Status: DC | PRN
  Administered 2015-08-02: 100 mg via INTRAVENOUS

## 2015-08-02 MED ORDER — SODIUM CHLORIDE 0.9 % IV SOLN
INTRAVENOUS | Status: DC | PRN
  Administered 2015-08-02: 07:00:00 via INTRAVENOUS

## 2015-08-02 MED ORDER — STERILE WATER FOR INJECTION IJ SOLN
INTRAMUSCULAR | Status: AC
  Filled 2015-08-02: qty 10

## 2015-08-02 MED ORDER — SODIUM CHLORIDE 0.9 % IV SOLN
500.0000 mg | INTRAVENOUS | Status: AC
  Administered 2015-08-02: 500 mg via INTRAVENOUS
  Filled 2015-08-02: qty 5

## 2015-08-02 MED ORDER — BACITRACIN ZINC 500 UNIT/GM EX OINT
TOPICAL_OINTMENT | CUTANEOUS | Status: DC | PRN
  Administered 2015-08-02: 1 via TOPICAL

## 2015-08-02 MED ORDER — ROCURONIUM BROMIDE 50 MG/5ML IV SOLN
INTRAVENOUS | Status: AC
Start: 2015-08-02 — End: 2015-08-02
  Filled 2015-08-02: qty 1

## 2015-08-02 MED ORDER — SODIUM CHLORIDE 0.9 % IJ SOLN
3.0000 mL | Freq: Two times a day (BID) | INTRAMUSCULAR | Status: DC
  Administered 2015-08-02 – 2015-08-04 (×3): 3 mL via INTRAVENOUS

## 2015-08-02 MED ORDER — VANCOMYCIN HCL IN DEXTROSE 1-5 GM/200ML-% IV SOLN
1000.0000 mg | Freq: Once | INTRAVENOUS | Status: AC
  Administered 2015-08-02: 1000 mg via INTRAVENOUS
  Filled 2015-08-02: qty 200

## 2015-08-02 MED ORDER — SENNA 8.6 MG PO TABS
1.0000 | ORAL_TABLET | Freq: Two times a day (BID) | ORAL | Status: DC
  Administered 2015-08-02 – 2015-08-05 (×6): 8.6 mg via ORAL
  Filled 2015-08-02 (×6): qty 1

## 2015-08-02 MED ORDER — ESMOLOL HCL 100 MG/10ML IV SOLN
INTRAVENOUS | Status: AC
  Filled 2015-08-02: qty 10

## 2015-08-02 MED ORDER — HYDROMORPHONE HCL 1 MG/ML IJ SOLN
INTRAMUSCULAR | Status: DC | PRN
  Administered 2015-08-02: 0.5 mg via INTRAVENOUS

## 2015-08-02 MED ORDER — PROMETHAZINE HCL 25 MG PO TABS: 12.5000 mg | ORAL_TABLET | ORAL | Status: DC | PRN

## 2015-08-02 MED ORDER — 0.9 % SODIUM CHLORIDE (POUR BTL) OPTIME
TOPICAL | Status: DC | PRN
  Administered 2015-08-02 (×2): 1000 mL

## 2015-08-02 MED ORDER — INSULIN ASPART 100 UNIT/ML ~~LOC~~ SOLN
10.0000 [IU] | Freq: Once | SUBCUTANEOUS | Status: AC
  Administered 2015-08-02: 10 [IU] via SUBCUTANEOUS

## 2015-08-02 MED ORDER — PHENYLEPHRINE HCL 10 MG/ML IJ SOLN
10.0000 mg | INTRAVENOUS | Status: DC | PRN
  Administered 2015-08-02: 20 ug/min via INTRAVENOUS

## 2015-08-02 MED ORDER — SODIUM CHLORIDE 0.9 % IV SOLN: Freq: Once | INTRAVENOUS | Status: DC

## 2015-08-02 MED ORDER — THROMBIN 5000 UNITS EX SOLR
OROMUCOSAL | Status: DC | PRN
  Administered 2015-08-02: 10 mL via TOPICAL

## 2015-08-02 MED ORDER — ACETAMINOPHEN 325 MG PO TABS: 650.0000 mg | ORAL_TABLET | ORAL | Status: DC | PRN

## 2015-08-02 MED ORDER — EPHEDRINE SULFATE 50 MG/ML IJ SOLN
INTRAMUSCULAR | Status: DC | PRN
  Administered 2015-08-02: 10 mg via INTRAVENOUS

## 2015-08-02 SURGICAL SUPPLY — 101 items
BANDAGE GAUZE 4  KLING STR (GAUZE/BANDAGES/DRESSINGS) IMPLANT
BATTERY IQ STERILE (MISCELLANEOUS) ×4 IMPLANT
BENZOIN TINCTURE PRP APPL 2/3 (GAUZE/BANDAGES/DRESSINGS) IMPLANT
BLADE CLIPPER SURG (BLADE) ×4 IMPLANT
BLADE SAW GIGLI 16 STRL (MISCELLANEOUS) IMPLANT
BLADE SURG 15 STRL LF DISP TIS (BLADE) IMPLANT
BLADE SURG 15 STRL SS (BLADE)
BLADE ULTRA TIP 2M (BLADE) ×4 IMPLANT
BNDG GAUZE ELAST 4 BULKY (GAUZE/BANDAGES/DRESSINGS) IMPLANT
BRUSH SCRUB EZ 1% IODOPHOR (MISCELLANEOUS) ×4 IMPLANT
BUR ACORN 6.0 PRECISION (BURR) ×3 IMPLANT
BUR ACORN 6.0MM PRECISION (BURR) ×1
BUR ADDG 1.1 (BURR) IMPLANT
BUR ADDG 1.1MM (BURR)
BUR MATCHSTICK NEURO 3.0 LAGG (BURR) IMPLANT
BUR ROUND FLUTED 4 SOFT TCH (BURR) IMPLANT
BUR ROUND FLUTED 4MM SOFT TCH (BURR)
BUR SPIRAL ROUTER 2.3 (BUR) IMPLANT
BUR SPIRAL ROUTER 2.3MM (BUR)
CANISTER SUCT 3000ML PPV (MISCELLANEOUS) ×4 IMPLANT
CATH VENTRIC 35X38 W/TROCAR LG (CATHETERS) IMPLANT
CLIP TI MEDIUM 6 (CLIP) IMPLANT
CONT SPEC 4OZ CLIKSEAL STRL BL (MISCELLANEOUS) ×4 IMPLANT
COVER MAYO STAND STRL (DRAPES) IMPLANT
DECANTER SPIKE VIAL GLASS SM (MISCELLANEOUS) ×4 IMPLANT
DRAIN SNY WOU 7FLT (WOUND CARE) IMPLANT
DRAIN SUBARACHNOID (WOUND CARE) IMPLANT
DRAPE MICROSCOPE LEICA (MISCELLANEOUS) ×4 IMPLANT
DRAPE NEUROLOGICAL W/INCISE (DRAPES) ×4 IMPLANT
DRAPE PROXIMA HALF (DRAPES) ×4 IMPLANT
DRAPE STERI IOBAN 125X83 (DRAPES) IMPLANT
DRAPE SURG 17X23 STRL (DRAPES) IMPLANT
DRAPE WARM FLUID 44X44 (DRAPE) ×4 IMPLANT
DRSG ADAPTIC 3X8 NADH LF (GAUZE/BANDAGES/DRESSINGS) IMPLANT
DRSG TELFA 3X8 NADH (GAUZE/BANDAGES/DRESSINGS) IMPLANT
DURAMATRIX ONLAY 3X3 (Plate) ×4 IMPLANT
DURAPREP 6ML APPLICATOR 50/CS (WOUND CARE) ×4 IMPLANT
ELECT REM PT RETURN 9FT ADLT (ELECTROSURGICAL) ×4
ELECTRODE REM PT RTRN 9FT ADLT (ELECTROSURGICAL) ×2 IMPLANT
EVACUATOR 1/8 PVC DRAIN (DRAIN) IMPLANT
EVACUATOR SILICONE 100CC (DRAIN) IMPLANT
FORCEPS BIPOLAR SPETZLER 8 1.0 (NEUROSURGERY SUPPLIES) ×4 IMPLANT
GAUZE SPONGE 4X4 12PLY STRL (GAUZE/BANDAGES/DRESSINGS) ×4 IMPLANT
GAUZE SPONGE 4X4 16PLY XRAY LF (GAUZE/BANDAGES/DRESSINGS) IMPLANT
GLOVE BIOGEL PI IND STRL 7.5 (GLOVE) ×2 IMPLANT
GLOVE BIOGEL PI INDICATOR 7.5 (GLOVE) ×2
GLOVE ECLIPSE 6.5 STRL STRAW (GLOVE) ×4 IMPLANT
GLOVE ECLIPSE 7.0 STRL STRAW (GLOVE) IMPLANT
GLOVE EXAM NITRILE LRG STRL (GLOVE) IMPLANT
GLOVE EXAM NITRILE MD LF STRL (GLOVE) IMPLANT
GLOVE EXAM NITRILE XL STR (GLOVE) IMPLANT
GLOVE EXAM NITRILE XS STR PU (GLOVE) IMPLANT
GOWN STRL REUS W/ TWL LRG LVL3 (GOWN DISPOSABLE) ×4 IMPLANT
GOWN STRL REUS W/ TWL XL LVL3 (GOWN DISPOSABLE) IMPLANT
GOWN STRL REUS W/TWL 2XL LVL3 (GOWN DISPOSABLE) IMPLANT
GOWN STRL REUS W/TWL LRG LVL3 (GOWN DISPOSABLE) ×4
GOWN STRL REUS W/TWL XL LVL3 (GOWN DISPOSABLE)
HEMOSTAT POWDER KIT SURGIFOAM (HEMOSTASIS) ×4 IMPLANT
HEMOSTAT SURGICEL 2X14 (HEMOSTASIS) IMPLANT
KIT BASIN OR (CUSTOM PROCEDURE TRAY) ×4 IMPLANT
KIT DRAIN CSF ACCUDRAIN (MISCELLANEOUS) IMPLANT
KIT ROOM TURNOVER OR (KITS) ×4 IMPLANT
KNIFE ARACHNOID DISP AM-24-S (MISCELLANEOUS) ×4 IMPLANT
MARKER SPHERE PSV REFLC 13MM (MARKER) ×20 IMPLANT
NEEDLE HYPO 25X1 1.5 SAFETY (NEEDLE) ×4 IMPLANT
NEEDLE SPNL 18GX3.5 QUINCKE PK (NEEDLE) IMPLANT
NS IRRIG 1000ML POUR BTL (IV SOLUTION) ×4 IMPLANT
PACK CRANIOTOMY (CUSTOM PROCEDURE TRAY) ×4 IMPLANT
PAD EYE OVAL STERILE LF (GAUZE/BANDAGES/DRESSINGS) IMPLANT
PATTIES SURGICAL .25X.25 (GAUZE/BANDAGES/DRESSINGS) IMPLANT
PATTIES SURGICAL .5 X.5 (GAUZE/BANDAGES/DRESSINGS) IMPLANT
PATTIES SURGICAL .5 X3 (DISPOSABLE) IMPLANT
PATTIES SURGICAL 1/4 X 3 (GAUZE/BANDAGES/DRESSINGS) IMPLANT
PATTIES SURGICAL 1X1 (DISPOSABLE) IMPLANT
PIN MAYFIELD SKULL DISP (PIN) ×4 IMPLANT
PLATE 1.5/0.5 13MM BURR HOLE (Plate) ×12 IMPLANT
PLATE 1.5/0.5 18.5MM BURR HOLE (Plate) ×4 IMPLANT
RUBBERBAND STERILE (MISCELLANEOUS) ×8 IMPLANT
SCREW SELF DRILL HT 1.5/4MM (Screw) ×52 IMPLANT
SET TUBING W/EXT DISP (INSTRUMENTS) ×4 IMPLANT
SPECIMEN JAR SMALL (MISCELLANEOUS) IMPLANT
SPONGE NEURO XRAY DETECT 1X3 (DISPOSABLE) IMPLANT
SPONGE SURGIFOAM ABS GEL 100 (HEMOSTASIS) ×4 IMPLANT
STAPLER VISISTAT 35W (STAPLE) ×4 IMPLANT
STOCKINETTE 6  STRL (DRAPES) ×2
STOCKINETTE 6 STRL (DRAPES) ×2 IMPLANT
SUT ETHILON 3 0 FSL (SUTURE) IMPLANT
SUT ETHILON 3 0 PS 1 (SUTURE) IMPLANT
SUT NURALON 4 0 TR CR/8 (SUTURE) ×12 IMPLANT
SUT SILK 0 TIES 10X30 (SUTURE) IMPLANT
SUT VIC AB 0 CT1 18XCR BRD8 (SUTURE) ×4 IMPLANT
SUT VIC AB 0 CT1 8-18 (SUTURE) ×4
SUT VIC AB 3-0 SH 8-18 (SUTURE) ×12 IMPLANT
TIP SONASTAR STD MISONIX 1.9 (TRAY / TRAY PROCEDURE) IMPLANT
TOWEL OR 17X24 6PK STRL BLUE (TOWEL DISPOSABLE) ×4 IMPLANT
TOWEL OR 17X26 10 PK STRL BLUE (TOWEL DISPOSABLE) ×4 IMPLANT
TRAY FOLEY W/METER SILVER 14FR (SET/KITS/TRAYS/PACK) ×4 IMPLANT
TUBE CONNECTING 12'X1/4 (SUCTIONS) ×1
TUBE CONNECTING 12X1/4 (SUCTIONS) ×3 IMPLANT
UNDERPAD 30X30 INCONTINENT (UNDERPADS AND DIAPERS) ×4 IMPLANT
WATER STERILE IRR 1000ML POUR (IV SOLUTION) ×4 IMPLANT

## 2015-08-02 NOTE — Progress Notes (Signed)
No issues overnight. Pt ready to proceed with surgery  EXAM:  BP 132/83 mmHg  Pulse 69  Temp(Src) 98 F (36.7 C) (Oral)  Resp 18  Ht 5\' 1"  (1.549 m)  Wt 77.656 kg (171 lb 3.2 oz)  BMI 32.36 kg/m2  SpO2 95%  Awake, alert, oriented  Speech fluent, appropriate  CN grossly intact  5/5 BUE/BLE   IMPRESSION:  69 y.o. female with right temporal mass, for surgery today  PLAN: - right temporal craniotomy for resection  - Plan on ICU admit postop

## 2015-08-02 NOTE — Anesthesia Preprocedure Evaluation (Addendum)
Anesthesia Evaluation  Patient identified by MRN, date of birth, ID band Patient awake    Reviewed: Allergy & Precautions, NPO status , Patient's Chart, lab work & pertinent test results  Airway Mallampati: IV  TM Distance: <3 FB Neck ROM: Full    Dental  (+) Teeth Intact, Dental Advisory Given   Pulmonary former smoker,    Pulmonary exam normal breath sounds clear to auscultation       Cardiovascular hypertension, Pt. on medications Normal cardiovascular exam Rhythm:Regular Rate:Normal     Neuro/Psych  Headaches, Seizures -,  PSYCHIATRIC DISORDERS Depression 4cm right temporal mass History of ACDF 1998  CT Head 07/31/2015: IMPRESSION: 1. Noncontrast study demonstrating new hyperdense hemorrhage within the large right temporal lobe mass since the CT on 07/27/2015. Surrounding edema has mildly increased, but intracranial mass effect is stable. 2. New 2 cm focus of subarachnoid hemorrhage in the left ambient cistern with nearby punctate hyperdense left pontine hemorrhage which was not evident on the prior CT, but had more of a chronic appearance on the MRI. 3. Superimposed cortical and basal ganglia dystrophic calcifications in the left hemisphere.    GI/Hepatic negative GI ROS, Neg liver ROS,   Endo/Other  diabetes, Type 2, Oral Hypoglycemic AgentsHypothyroidism Obesity History of thyroid cancer 30 years ago  Renal/GU Renal InsufficiencyRenal disease     Musculoskeletal negative musculoskeletal ROS (+)   Abdominal   Peds  Hematology negative hematology ROS (+)   Anesthesia Other Findings Day of surgery medications reviewed with the patient.  Reproductive/Obstetrics                           Anesthesia Physical Anesthesia Plan  ASA: III  Anesthesia Plan: General   Post-op Pain Management:    Induction: Intravenous  Airway Management Planned: Oral ETT and Video Laryngoscope  Planned  Additional Equipment: Arterial line, CVP and Ultrasound Guidance Line Placement  Intra-op Plan:   Post-operative Plan: Extubation in OR  Informed Consent: I have reviewed the patients History and Physical, chart, labs and discussed the procedure including the risks, benefits and alternatives for the proposed anesthesia with the patient or authorized representative who has indicated his/her understanding and acceptance.   Dental advisory given  Plan Discussed with: CRNA  Anesthesia Plan Comments: (Risks/benefits of general anesthesia discussed with patient including risk of damage to teeth, lips, gum, and tongue, nausea/vomiting, allergic reactions to medications, and the possibility of heart attack, stroke and death.  All patient questions answered.  Patient wishes to proceed.)        Anesthesia Quick Evaluation

## 2015-08-02 NOTE — Anesthesia Procedure Notes (Addendum)
Central Venous Catheter Insertion Performed by: anesthesiologist 08/02/2015 7:14 AM Patient location: Pre-op. Preanesthetic checklist: patient identified, IV checked, site marked, risks and benefits discussed, surgical consent, monitors and equipment checked, pre-op evaluation, timeout performed and anesthesia consent Position: reverse Trendelenburg Lidocaine 1% used for infiltration Landmarks identified Catheter size: 8 Fr Central line was placed.Double lumen Procedure performed using ultrasound guided technique. Attempts: 1 Following insertion, dressing applied, line sutured and Biopatch. Post procedure assessment: blood return through all ports, free fluid flow and no air. Patient tolerated the procedure well with no immediate complications.   Procedure Name: Intubation Date/Time: 08/02/2015 7:49 AM Performed by: Garrison [place] T Pre-anesthesia Checklist: Patient identified, Emergency Drugs available, Suction available and Patient being monitored Patient Re-evaluated:Patient Re-evaluated prior to inductionOxygen Delivery Method: Circle system utilized Preoxygenation: Pre-oxygenation with 100% oxygen Intubation Type: IV induction and Rapid sequence Ventilation: Oral airway inserted - appropriate to patient size and Two handed mask ventilation required Laryngoscope Size: Glidescope and 3 Grade View: Grade I Tube type: Subglottic suction tube Tube size: 7.5 mm Number of attempts: 1 Airway Equipment and Method: Stylet,  Video-laryngoscopy and Oral airway Placement Confirmation: ETT inserted through vocal cords under direct vision,  positive ETCO2 and breath sounds checked- equal and bilateral Secured at: 22 cm Tube secured with: Tape Dental Injury: Teeth and Oropharynx as per pre-operative assessment

## 2015-08-02 NOTE — Care Management (Signed)
UR updated . Galit Urich RN BSN [phone]  

## 2015-08-02 NOTE — Progress Notes (Signed)
ANTIBIOTIC CONSULT NOTE - INITIAL  Pharmacy Consult for vancomycin Indication: surgical prophylaxis x 24h  Allergies  Allergen Reactions  . Ampicillin Other (See Comments)    Yeast rash   . Fish-Derived Products Other (See Comments)    gout  . Tomato Other (See Comments)    Upset stomach   . Penicillins Rash    Patient Measurements: Height: 5\' 1"  (154.9 cm) Weight: 171 lb 3.2 oz (77.656 kg) IBW/kg (Calculated) : 47.8   Vital Signs: Temp: 97 F (36.1 C) (12/23 1255) Temp Source: Oral (12/23 0458) BP: 129/76 mmHg (12/23 1400) Pulse Rate: 66 (12/23 1400) Intake/Output from previous day: 12/22 0701 - 12/23 0700 In: 1200 [P.O.:1200] Out: -  Intake/Output from this shift: Total I/O In: 1800 [I.V.:1800] Out: 595 [Urine:395; Blood:200]  Labs: No results for input(s): WBC, HGB, PLT, LABCREA, CREATININE in the last 72 hours. Estimated Creatinine Clearance: 38.3 mL/min (by C-G formula based on Cr of 1.31). No results for input(s): VANCOTROUGH, VANCOPEAK, VANCORANDOM, GENTTROUGH, GENTPEAK, GENTRANDOM, TOBRATROUGH, TOBRAPEAK, TOBRARND, AMIKACINPEAK, AMIKACINTROU, AMIKACIN in the last 72 hours.   Microbiology: Recent Results (from the past 720 hour(s))  Surgical pcr screen     Status: None   Collection Time: 08/01/15 11:42 PM  Result Value Ref Range Status   MRSA, PCR NEGATIVE NEGATIVE Final   Staphylococcus aureus NEGATIVE NEGATIVE Final    Comment:        The Xpert SA Assay (FDA approved for NASAL specimens in patients over 54 years of age), is one component of a comprehensive surveillance program.  Test performance has been validated by [facility] for patients greater than or equal to 16 year old. It is not intended to diagnose infection nor to guide or monitor treatment.     Medical History: Past Medical History  Diagnosis Date  . Hypertension   . Diabetes mellitus without complication ([place])   . Hyperlipidemia   . Depression   . Varicose vein   .  Epilepsy ([place])   . Thyroid disease     h/o thyroid ca- 30 years ago     Assessment: 69 yof s/p craniotomy for resection of tumor. Pharmacy consulted to dose vancomycin if penicillin allergic for surgical ppx x 24h. Patient has itching allergy to penicillins per MAR. Received Vanc 1g at 0730 this AM.   Goal of Therapy:  Prevention of infection post-surgery  Plan:  Vanc 1g x 1 dose to cover 24h post-op surgical ppx  Elicia Lamp, PharmD, [facility] Clinical Pharmacist Pager [phone] 08/02/2015 2:53 PM
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
## 2015-08-02 NOTE — Transfer of Care (Signed)
Immediate Anesthesia Transfer of Care Note  Patient: Martha [name]  Procedure(s) Performed: Procedure(s) with comments: CRANIOTOMY TUMOR EXCISION (Right) - CRANIOTOMY TUMOR EXCISION APPLICATION OF CRANIAL NAVIGATION (N/A) - APPLICATION OF CRANIAL NAVIGATION  Patient Location: PACU  Anesthesia Type:General  Level of Consciousness: awake, alert  and oriented  Airway & Oxygen Therapy: Patient Spontanous Breathing and Patient connected to nasal cannula oxygen  Post-op Assessment: Report given to RN, Post -op Vital signs reviewed and stable and Patient moving all extremities X 4  Post vital signs: Reviewed and stable  Last Vitals:  Filed Vitals:   08/02/15 0138 08/02/15 0458  BP: 111/60 132/83  Pulse: 57 69  Temp: 36.5 C 36.7 C  Resp: 17 18    Complications: No apparent anesthesia complications

## 2015-08-02 NOTE — Progress Notes (Signed)
Pt transported to OR without incident. Belongings sent with family friend per patient's request.

## 2015-08-02 NOTE — Anesthesia Postprocedure Evaluation (Signed)
Anesthesia Post Note  Patient: Martha [name]  Procedure(s) Performed: Procedure(s) (LRB): CRANIOTOMY TUMOR EXCISION (Right) APPLICATION OF CRANIAL NAVIGATION (N/A)  Patient location during evaluation: PACU Anesthesia Type: General Level of consciousness: awake and alert Pain management: pain level controlled Vital Signs Assessment: post-procedure vital signs reviewed and stable Respiratory status: spontaneous breathing, nonlabored ventilation, respiratory function stable and patient connected to nasal cannula oxygen Cardiovascular status: blood pressure returned to baseline and stable Postop Assessment: no signs of nausea or vomiting Anesthetic complications: no    Last Vitals:  Filed Vitals:   08/02/15 1230 08/02/15 1245  BP: 127/81 133/87  Pulse: 71 72  Temp:    Resp: 14 15    Last Pain:  Filed Vitals:   08/02/15 1248  PainSc: Asleep    LLE Motor Response: Purposeful movement (08/02/15 1245) LLE Sensation: Full sensation;No numbness;No pain;No tingling (08/02/15 1245) RLE Motor Response: Purposeful movement (08/02/15 1245) RLE Sensation: Full sensation;No pain;No numbness;No tingling (08/02/15 1245)      Catalina Gravel

## 2015-08-03 ENCOUNTER — Inpatient Hospital Stay (HOSPITAL_COMMUNITY): Payer: Medicare Other

## 2015-08-03 DIAGNOSIS — C712 Malignant neoplasm of temporal lobe: Secondary | ICD-10-CM | POA: Diagnosis not present

## 2015-08-03 LAB — GLUCOSE, CAPILLARY
GLUCOSE-CAPILLARY: 163 mg/dL — AB (ref 65–99)
GLUCOSE-CAPILLARY: 153 mg/dL — AB (ref 65–99)
GLUCOSE-CAPILLARY: 191 mg/dL — AB (ref 65–99)
Glucose-Capillary: 128 mg/dL — ABNORMAL HIGH (ref 65–99)
Glucose-Capillary: 168 mg/dL — ABNORMAL HIGH (ref 65–99)

## 2015-08-03 MED ORDER — GADOBENATE DIMEGLUMINE 529 MG/ML IV SOLN
17.0000 mL | Freq: Once | INTRAVENOUS | Status: AC | PRN
  Administered 2015-08-03: 17 mL via INTRAVENOUS

## 2015-08-03 MED ORDER — LEVOTHYROXINE SODIUM 25 MCG PO TABS
125.0000 ug | ORAL_TABLET | Freq: Every day | ORAL | Status: DC
  Administered 2015-08-03 – 2015-08-05 (×3): 125 ug via ORAL
  Filled 2015-08-03 (×3): qty 1

## 2015-08-03 NOTE — Progress Notes (Signed)
Subjective: Patient resting in bed, comfortable, without complaints.  Postop MRI apparently pending per Dr. Marjory Sneddon.  Objective: Vital signs in last 24 hours: Filed Vitals:   08/03/15 0700 08/03/15 0748 08/03/15 0800 08/03/15 0900  BP: 137/83  141/84 141/92  Pulse: 65  60 59  Temp:  97.8 F (36.6 C)    TempSrc:  Oral    Resp: 16  17 18   Height:      Weight:      SpO2: 96%  97% 95%    Intake/Output from previous day: 12/23 0701 - 12/24 0700 In: 3693 [P.O.:130; I.V.:3213] Out: 1670 [Urine:1470; Blood:200] Intake/Output this shift: Total I/O In: 390 [P.O.:240; I.V.:150] Out: 240 [Urine:240]  Physical Exam:  Awake alert, oriented to name, [facility], and December 2016. Following commands. Speech fluent. Moving all 4 extremity is well. No drift of upper extremities. Dressing clean and dry.  Assessment/Plan: Stable following surgery yesterday. Postop MRI pending. We'll DC a line per patient's nurse request.   Hosie Spangle, MD 08/03/2015, 9:58 AM

## 2015-08-04 LAB — TYPE AND SCREEN
ABO/RH(D): A POS
ANTIBODY SCREEN: NEGATIVE
UNIT DIVISION: 0
UNIT DIVISION: 0

## 2015-08-04 LAB — GLUCOSE, CAPILLARY
GLUCOSE-CAPILLARY: 174 mg/dL — AB (ref 65–99)
Glucose-Capillary: 175 mg/dL — ABNORMAL HIGH (ref 65–99)
Glucose-Capillary: 127 mg/dL — ABNORMAL HIGH (ref 65–99)

## 2015-08-04 MED ORDER — CETYLPYRIDINIUM CHLORIDE 0.05 % MT LIQD
7.0000 mL | Freq: Two times a day (BID) | OROMUCOSAL | Status: DC
  Administered 2015-08-04 (×2): 7 mL via OROMUCOSAL

## 2015-08-04 MED ORDER — DEXAMETHASONE 2 MG PO TABS
2.0000 mg | ORAL_TABLET | Freq: Four times a day (QID) | ORAL | Status: DC
  Administered 2015-08-04 – 2015-08-05 (×5): 2 mg via ORAL
  Filled 2015-08-04 (×5): qty 1

## 2015-08-04 NOTE — Progress Notes (Signed)
Patient ID: Martha [name], female   DOB: 1945/10/24, 69 y.o.   MRN: [id number] Afeb, vss Looks good. Dressing clean and dry. No focal weakness noted. Will transfer to floor and increase activity.

## 2015-08-05 LAB — GLUCOSE, CAPILLARY
GLUCOSE-CAPILLARY: 120 mg/dL — AB (ref 65–99)
Glucose-Capillary: 161 mg/dL — ABNORMAL HIGH (ref 65–99)

## 2015-08-05 MED ORDER — LEVETIRACETAM 500 MG PO TABS
500.0000 mg | ORAL_TABLET | Freq: Two times a day (BID) | ORAL | Status: DC
Start: 2015-08-05 — End: 2015-08-26

## 2015-08-05 MED ORDER — PANTOPRAZOLE SODIUM 20 MG PO TBEC: 20.0000 mg | DELAYED_RELEASE_TABLET | Freq: Every day | ORAL | Status: DC

## 2015-08-05 MED ORDER — HYDROCODONE-ACETAMINOPHEN 5-325 MG PO TABS: 1.0000 | ORAL_TABLET | Freq: Four times a day (QID) | ORAL | Status: DC | PRN

## 2015-08-05 MED ORDER — DEXAMETHASONE 2 MG PO TABS: 2.0000 mg | ORAL_TABLET | Freq: Two times a day (BID) | ORAL | Status: DC

## 2015-08-05 NOTE — Progress Notes (Signed)
   08/05/15 1043  Pressure Ulcer Prevention  Repositioned Sitting  Positioning Frequency Able to turn self  Mobility  Activity Ambulate in hall;Ambulate in room;Chair  Level of Assistance Independent  Ambulation Response Tolerated well  Bed Position Chair  Range of Motion Active;All extremities    Patient noted slightly unsteady while ambulating in hallway. She denied pain/ha/diziness at this time. Will continue to monitor.  Ave Filter, RN
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
## 2015-08-05 NOTE — Progress Notes (Signed)
No acute events AVSS Incision clean, dry, intact Awake, alert, oriented Moves all extremities well Doing well PT/OT, if cleared will DC

## 2015-08-05 NOTE — Progress Notes (Signed)
Discharge instructions reviewed with patient/family. All questions answered at this time. RX given to patient. Transport by family.  Bijon Mineer, RN 
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
## 2015-08-05 NOTE — Progress Notes (Signed)
Patient's gauze head wrap dressing had come off this evening. No new drainage. Old drainage had been marked. Will replace with new gauze and continue to monitor. Burr Soffer, Rande Brunt, RN

## 2015-08-05 NOTE — Progress Notes (Signed)
Patient with discharge order, awaiting for PT and OT to evaluate prior to discharge.  Left message with PT/OT department.   Ave Filter, RN
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
## 2015-08-06 ENCOUNTER — Encounter (HOSPITAL_COMMUNITY): Payer: Self-pay | Admitting: Neurosurgery

## 2015-08-06 LAB — POCT I-STAT 4, (NA,K, GLUC, HGB,HCT)
Glucose, Bld: 279 mg/dL — ABNORMAL HIGH (ref 65–99)
HCT: 36 % (ref 36.0–46.0)
Hemoglobin: 12.2 g/dL (ref 12.0–15.0)
Potassium: 3.9 mmol/L (ref 3.5–5.1)
SODIUM: 136 mmol/L (ref 135–145)

## 2015-08-20 ENCOUNTER — Encounter: Payer: Self-pay | Admitting: Radiation Oncology
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
## 2015-08-21 ENCOUNTER — Inpatient Hospital Stay: Admission: RE | Admit: 2015-08-21 | Payer: Medicare Other | Source: Ambulatory Visit

## 2015-08-21 ENCOUNTER — Ambulatory Visit: Payer: Medicare Other | Admitting: Hematology and Oncology

## 2015-08-21 ENCOUNTER — Ambulatory Visit: Payer: Medicare Other

## 2015-08-21 ENCOUNTER — Ambulatory Visit
Admission: RE | Admit: 2015-08-21 | Discharge: 2015-08-21 | Disposition: A | Discharge status: Home / Self Care | Payer: Medicare Other | Source: Ambulatory Visit | Attending: Radiation Oncology | Admitting: Radiation Oncology

## 2015-08-21 DIAGNOSIS — Z833 Family history of diabetes mellitus: Secondary | ICD-10-CM | POA: Insufficient documentation

## 2015-08-21 DIAGNOSIS — Z85850 Personal history of malignant neoplasm of thyroid: Secondary | ICD-10-CM | POA: Insufficient documentation

## 2015-08-21 DIAGNOSIS — Z880 Allergy status to penicillin: Secondary | ICD-10-CM | POA: Insufficient documentation

## 2015-08-21 DIAGNOSIS — Z7984 Long term (current) use of oral hypoglycemic drugs: Secondary | ICD-10-CM | POA: Insufficient documentation

## 2015-08-21 DIAGNOSIS — Z510 Encounter for antineoplastic radiation therapy: Secondary | ICD-10-CM | POA: Insufficient documentation

## 2015-08-21 DIAGNOSIS — Z8249 Family history of ischemic heart disease and other diseases of the circulatory system: Secondary | ICD-10-CM | POA: Insufficient documentation

## 2015-08-21 DIAGNOSIS — Z800 Family history of malignant neoplasm of digestive organs: Secondary | ICD-10-CM | POA: Insufficient documentation

## 2015-08-21 DIAGNOSIS — E785 Hyperlipidemia, unspecified: Secondary | ICD-10-CM | POA: Insufficient documentation

## 2015-08-21 DIAGNOSIS — I10 Essential (primary) hypertension: Secondary | ICD-10-CM | POA: Insufficient documentation

## 2015-08-21 DIAGNOSIS — G40909 Epilepsy, unspecified, not intractable, without status epilepticus: Secondary | ICD-10-CM | POA: Insufficient documentation

## 2015-08-21 DIAGNOSIS — Z87891 Personal history of nicotine dependence: Secondary | ICD-10-CM | POA: Insufficient documentation

## 2015-08-21 DIAGNOSIS — F329 Major depressive disorder, single episode, unspecified: Secondary | ICD-10-CM | POA: Insufficient documentation

## 2015-08-21 DIAGNOSIS — E119 Type 2 diabetes mellitus without complications: Secondary | ICD-10-CM | POA: Insufficient documentation

## 2015-08-21 DIAGNOSIS — Z79899 Other long term (current) drug therapy: Secondary | ICD-10-CM | POA: Insufficient documentation

## 2015-08-21 DIAGNOSIS — C712 Malignant neoplasm of temporal lobe: Secondary | ICD-10-CM | POA: Insufficient documentation

## 2015-08-21 HISTORY — DX: Malignant neoplasm of brain, unspecified: C71.9

## 2015-08-21 HISTORY — DX: Malignant (primary) neoplasm, unspecified: C80.1

## 2015-08-23 ENCOUNTER — Ambulatory Visit
Admission: RE | Admit: 2015-08-23 | Discharge: 2015-08-23 | Disposition: A | Discharge status: Home / Self Care | Payer: Medicare Other | Source: Ambulatory Visit | Attending: Radiation Oncology | Admitting: Radiation Oncology

## 2015-08-23 ENCOUNTER — Encounter (INDEPENDENT_AMBULATORY_CARE_PROVIDER_SITE_OTHER): Payer: Self-pay

## 2015-08-23 ENCOUNTER — Encounter: Payer: Self-pay | Admitting: Radiation Oncology

## 2015-08-23 VITALS — BP 96/67 | HR 83 | Temp 96.0°F | Resp 20 | Wt 155.6 lb

## 2015-08-23 DIAGNOSIS — C719 Malignant neoplasm of brain, unspecified: Secondary | ICD-10-CM

## 2015-08-23 DIAGNOSIS — C712 Malignant neoplasm of temporal lobe: Secondary | ICD-10-CM | POA: Diagnosis not present
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
## 2015-08-26 ENCOUNTER — Inpatient Hospital Stay: Payer: Medicare Other | Attending: Oncology | Admitting: Oncology

## 2015-08-26 VITALS — BP 94/66 | HR 93 | Temp 97.4°F | Resp 18 | Wt 155.0 lb

## 2015-08-26 DIAGNOSIS — F329 Major depressive disorder, single episode, unspecified: Secondary | ICD-10-CM | POA: Diagnosis not present

## 2015-08-26 DIAGNOSIS — Z87891 Personal history of nicotine dependence: Secondary | ICD-10-CM | POA: Diagnosis not present

## 2015-08-26 DIAGNOSIS — E119 Type 2 diabetes mellitus without complications: Secondary | ICD-10-CM | POA: Insufficient documentation

## 2015-08-26 DIAGNOSIS — C712 Malignant neoplasm of temporal lobe: Secondary | ICD-10-CM | POA: Insufficient documentation

## 2015-08-26 DIAGNOSIS — I10 Essential (primary) hypertension: Secondary | ICD-10-CM | POA: Diagnosis not present

## 2015-08-26 DIAGNOSIS — C719 Malignant neoplasm of brain, unspecified: Secondary | ICD-10-CM

## 2015-08-26 DIAGNOSIS — Z85850 Personal history of malignant neoplasm of thyroid: Secondary | ICD-10-CM | POA: Insufficient documentation

## 2015-08-26 DIAGNOSIS — Z79899 Other long term (current) drug therapy: Secondary | ICD-10-CM | POA: Insufficient documentation

## 2015-08-26 DIAGNOSIS — E785 Hyperlipidemia, unspecified: Secondary | ICD-10-CM | POA: Insufficient documentation

## 2015-08-26 NOTE — Progress Notes (Signed)
Patient here as new evaluation for GBM.  Referred by Dr, Kathyrn Sheriff.

## 2015-08-27 ENCOUNTER — Encounter: Payer: Self-pay | Admitting: Oncology

## 2015-08-27 ENCOUNTER — Other Ambulatory Visit: Payer: Self-pay | Admitting: *Deleted

## 2015-08-27 ENCOUNTER — Telehealth: Payer: Self-pay | Admitting: *Deleted

## 2015-08-27 DIAGNOSIS — C719 Malignant neoplasm of brain, unspecified: Secondary | ICD-10-CM

## 2015-08-27 MED ORDER — ONDANSETRON HCL 4 MG PO TABS: 4.0000 mg | ORAL_TABLET | ORAL | Status: DC | PRN

## 2015-08-27 NOTE — Telephone Encounter (Signed)
Per Dr. Oliva Bustard he wants pt to start temodar on the same day she starts radiation which is 09/04/15, filled out biologic papers and faxed them in.  Electronically sent it rx for zofran for nausea.  Will speak to pt tom. When they come for simulation appt.
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
## 2015-08-28 ENCOUNTER — Ambulatory Visit
Admission: RE | Admit: 2015-08-28 | Discharge: 2015-08-28 | Disposition: A | Discharge status: Home / Self Care | Payer: Medicare Other | Source: Ambulatory Visit | Attending: Radiation Oncology | Admitting: Radiation Oncology

## 2015-08-28 DIAGNOSIS — F329 Major depressive disorder, single episode, unspecified: Secondary | ICD-10-CM | POA: Diagnosis not present

## 2015-08-28 DIAGNOSIS — Z85850 Personal history of malignant neoplasm of thyroid: Secondary | ICD-10-CM | POA: Diagnosis not present

## 2015-08-28 DIAGNOSIS — E119 Type 2 diabetes mellitus without complications: Secondary | ICD-10-CM | POA: Diagnosis not present

## 2015-08-28 DIAGNOSIS — Z87891 Personal history of nicotine dependence: Secondary | ICD-10-CM | POA: Diagnosis not present

## 2015-08-28 DIAGNOSIS — Z79899 Other long term (current) drug therapy: Secondary | ICD-10-CM | POA: Diagnosis not present

## 2015-08-28 DIAGNOSIS — Z880 Allergy status to penicillin: Secondary | ICD-10-CM | POA: Diagnosis not present

## 2015-08-28 DIAGNOSIS — Z510 Encounter for antineoplastic radiation therapy: Secondary | ICD-10-CM | POA: Diagnosis not present

## 2015-08-28 DIAGNOSIS — G40909 Epilepsy, unspecified, not intractable, without status epilepticus: Secondary | ICD-10-CM | POA: Diagnosis not present

## 2015-08-28 DIAGNOSIS — Z7984 Long term (current) use of oral hypoglycemic drugs: Secondary | ICD-10-CM | POA: Diagnosis not present

## 2015-08-28 DIAGNOSIS — Z800 Family history of malignant neoplasm of digestive organs: Secondary | ICD-10-CM | POA: Diagnosis not present

## 2015-08-28 DIAGNOSIS — Z8249 Family history of ischemic heart disease and other diseases of the circulatory system: Secondary | ICD-10-CM | POA: Diagnosis not present

## 2015-08-28 DIAGNOSIS — Z833 Family history of diabetes mellitus: Secondary | ICD-10-CM | POA: Diagnosis not present

## 2015-08-28 DIAGNOSIS — I10 Essential (primary) hypertension: Secondary | ICD-10-CM | POA: Diagnosis not present

## 2015-08-28 DIAGNOSIS — E785 Hyperlipidemia, unspecified: Secondary | ICD-10-CM | POA: Diagnosis not present

## 2015-08-28 DIAGNOSIS — C712 Malignant neoplasm of temporal lobe: Secondary | ICD-10-CM | POA: Diagnosis not present

## 2015-09-02 ENCOUNTER — Telehealth: Payer: Self-pay | Admitting: *Deleted
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
## 2015-09-02 NOTE — Telephone Encounter (Signed)
Notified that letter is ready to be picked up

## 2015-09-03 ENCOUNTER — Telehealth: Payer: Self-pay | Admitting: *Deleted
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
## 2015-09-05 DIAGNOSIS — I10 Essential (primary) hypertension: Secondary | ICD-10-CM | POA: Diagnosis not present

## 2015-09-05 DIAGNOSIS — C712 Malignant neoplasm of temporal lobe: Secondary | ICD-10-CM | POA: Diagnosis not present

## 2015-09-05 DIAGNOSIS — Z510 Encounter for antineoplastic radiation therapy: Secondary | ICD-10-CM | POA: Diagnosis not present

## 2015-09-05 DIAGNOSIS — E119 Type 2 diabetes mellitus without complications: Secondary | ICD-10-CM | POA: Diagnosis not present

## 2015-09-05 DIAGNOSIS — Z85850 Personal history of malignant neoplasm of thyroid: Secondary | ICD-10-CM | POA: Diagnosis not present

## 2015-09-05 DIAGNOSIS — E785 Hyperlipidemia, unspecified: Secondary | ICD-10-CM | POA: Diagnosis not present

## 2015-09-06 ENCOUNTER — Ambulatory Visit: Payer: Medicare Other

## 2015-09-06 ENCOUNTER — Other Ambulatory Visit: Payer: Self-pay | Admitting: *Deleted

## 2015-09-06 DIAGNOSIS — Z85850 Personal history of malignant neoplasm of thyroid: Secondary | ICD-10-CM | POA: Diagnosis not present

## 2015-09-06 DIAGNOSIS — E119 Type 2 diabetes mellitus without complications: Secondary | ICD-10-CM | POA: Diagnosis not present

## 2015-09-06 DIAGNOSIS — Z510 Encounter for antineoplastic radiation therapy: Secondary | ICD-10-CM | POA: Diagnosis not present

## 2015-09-06 DIAGNOSIS — C712 Malignant neoplasm of temporal lobe: Secondary | ICD-10-CM

## 2015-09-06 DIAGNOSIS — E785 Hyperlipidemia, unspecified: Secondary | ICD-10-CM | POA: Diagnosis not present

## 2015-09-06 DIAGNOSIS — I10 Essential (primary) hypertension: Secondary | ICD-10-CM | POA: Diagnosis not present

## 2015-09-07 ENCOUNTER — Other Ambulatory Visit: Payer: Self-pay | Admitting: Family Medicine

## 2015-09-09 ENCOUNTER — Ambulatory Visit: Payer: Medicare Other

## 2015-09-10 ENCOUNTER — Ambulatory Visit: Payer: Medicare Other

## 2015-09-11 ENCOUNTER — Ambulatory Visit
Admission: RE | Admit: 2015-09-11 | Discharge: 2015-09-11 | Disposition: A | Discharge status: Home / Self Care | Payer: Medicare Other | Source: Ambulatory Visit | Attending: Radiation Oncology | Admitting: Radiation Oncology

## 2015-09-11 DIAGNOSIS — Z510 Encounter for antineoplastic radiation therapy: Secondary | ICD-10-CM | POA: Diagnosis not present

## 2015-09-11 DIAGNOSIS — Z85850 Personal history of malignant neoplasm of thyroid: Secondary | ICD-10-CM | POA: Diagnosis not present

## 2015-09-11 DIAGNOSIS — E119 Type 2 diabetes mellitus without complications: Secondary | ICD-10-CM | POA: Diagnosis not present

## 2015-09-11 DIAGNOSIS — E785 Hyperlipidemia, unspecified: Secondary | ICD-10-CM | POA: Diagnosis not present

## 2015-09-11 DIAGNOSIS — I10 Essential (primary) hypertension: Secondary | ICD-10-CM | POA: Diagnosis not present

## 2015-09-11 DIAGNOSIS — C712 Malignant neoplasm of temporal lobe: Secondary | ICD-10-CM | POA: Diagnosis not present

## 2015-09-12 ENCOUNTER — Ambulatory Visit
Admission: RE | Admit: 2015-09-12 | Discharge: 2015-09-12 | Disposition: A | Discharge status: Home / Self Care | Payer: Medicare Other | Source: Ambulatory Visit | Attending: Radiation Oncology | Admitting: Radiation Oncology

## 2015-09-12 DIAGNOSIS — E119 Type 2 diabetes mellitus without complications: Secondary | ICD-10-CM | POA: Diagnosis not present

## 2015-09-12 DIAGNOSIS — Z85850 Personal history of malignant neoplasm of thyroid: Secondary | ICD-10-CM | POA: Diagnosis not present

## 2015-09-12 DIAGNOSIS — I10 Essential (primary) hypertension: Secondary | ICD-10-CM | POA: Diagnosis not present

## 2015-09-12 DIAGNOSIS — C712 Malignant neoplasm of temporal lobe: Secondary | ICD-10-CM | POA: Diagnosis not present

## 2015-09-12 DIAGNOSIS — E785 Hyperlipidemia, unspecified: Secondary | ICD-10-CM | POA: Diagnosis not present

## 2015-09-12 DIAGNOSIS — Z510 Encounter for antineoplastic radiation therapy: Secondary | ICD-10-CM | POA: Diagnosis not present

## 2015-09-13 ENCOUNTER — Ambulatory Visit
Admission: RE | Admit: 2015-09-13 | Discharge: 2015-09-13 | Disposition: A | Discharge status: Home / Self Care | Payer: Medicare Other | Source: Ambulatory Visit | Attending: Radiation Oncology | Admitting: Radiation Oncology

## 2015-09-13 DIAGNOSIS — Z85850 Personal history of malignant neoplasm of thyroid: Secondary | ICD-10-CM | POA: Diagnosis not present

## 2015-09-13 DIAGNOSIS — C712 Malignant neoplasm of temporal lobe: Secondary | ICD-10-CM | POA: Diagnosis not present

## 2015-09-13 DIAGNOSIS — E785 Hyperlipidemia, unspecified: Secondary | ICD-10-CM | POA: Diagnosis not present

## 2015-09-13 DIAGNOSIS — E119 Type 2 diabetes mellitus without complications: Secondary | ICD-10-CM | POA: Diagnosis not present

## 2015-09-13 DIAGNOSIS — Z510 Encounter for antineoplastic radiation therapy: Secondary | ICD-10-CM | POA: Diagnosis not present

## 2015-09-13 DIAGNOSIS — I10 Essential (primary) hypertension: Secondary | ICD-10-CM | POA: Diagnosis not present

## 2015-09-16 ENCOUNTER — Ambulatory Visit
Admission: RE | Admit: 2015-09-16 | Discharge: 2015-09-16 | Disposition: A | Discharge status: Home / Self Care | Payer: Medicare Other | Source: Ambulatory Visit | Attending: Radiation Oncology | Admitting: Radiation Oncology

## 2015-09-16 DIAGNOSIS — C712 Malignant neoplasm of temporal lobe: Secondary | ICD-10-CM | POA: Diagnosis not present

## 2015-09-16 DIAGNOSIS — I10 Essential (primary) hypertension: Secondary | ICD-10-CM | POA: Diagnosis not present

## 2015-09-16 DIAGNOSIS — Z510 Encounter for antineoplastic radiation therapy: Secondary | ICD-10-CM | POA: Diagnosis not present

## 2015-09-16 DIAGNOSIS — E785 Hyperlipidemia, unspecified: Secondary | ICD-10-CM | POA: Diagnosis not present

## 2015-09-16 DIAGNOSIS — E119 Type 2 diabetes mellitus without complications: Secondary | ICD-10-CM | POA: Diagnosis not present

## 2015-09-16 DIAGNOSIS — Z85850 Personal history of malignant neoplasm of thyroid: Secondary | ICD-10-CM | POA: Diagnosis not present

## 2015-09-17 ENCOUNTER — Other Ambulatory Visit: Payer: Self-pay | Admitting: *Deleted

## 2015-09-17 ENCOUNTER — Ambulatory Visit
Admission: RE | Admit: 2015-09-17 | Discharge: 2015-09-17 | Disposition: A | Discharge status: Home / Self Care | Payer: Medicare Other | Source: Ambulatory Visit | Attending: Radiation Oncology | Admitting: Radiation Oncology

## 2015-09-17 DIAGNOSIS — E785 Hyperlipidemia, unspecified: Secondary | ICD-10-CM | POA: Diagnosis not present

## 2015-09-17 DIAGNOSIS — Z85850 Personal history of malignant neoplasm of thyroid: Secondary | ICD-10-CM | POA: Diagnosis not present

## 2015-09-17 DIAGNOSIS — E119 Type 2 diabetes mellitus without complications: Secondary | ICD-10-CM | POA: Diagnosis not present

## 2015-09-17 DIAGNOSIS — C712 Malignant neoplasm of temporal lobe: Secondary | ICD-10-CM | POA: Diagnosis not present

## 2015-09-17 DIAGNOSIS — I10 Essential (primary) hypertension: Secondary | ICD-10-CM | POA: Diagnosis not present

## 2015-09-17 DIAGNOSIS — Z510 Encounter for antineoplastic radiation therapy: Secondary | ICD-10-CM | POA: Diagnosis not present

## 2015-09-17 MED ORDER — PROMETHAZINE HCL 25 MG PO TABS: 25.0000 mg | ORAL_TABLET | Freq: Four times a day (QID) | ORAL | Status: DC | PRN

## 2015-09-18 ENCOUNTER — Inpatient Hospital Stay: Payer: Medicare Other | Attending: Oncology

## 2015-09-18 ENCOUNTER — Ambulatory Visit
Admission: RE | Admit: 2015-09-18 | Discharge: 2015-09-18 | Disposition: A | Discharge status: Home / Self Care | Payer: Medicare Other | Source: Ambulatory Visit | Attending: Radiation Oncology | Admitting: Radiation Oncology

## 2015-09-18 DIAGNOSIS — Z87891 Personal history of nicotine dependence: Secondary | ICD-10-CM | POA: Insufficient documentation

## 2015-09-18 DIAGNOSIS — F329 Major depressive disorder, single episode, unspecified: Secondary | ICD-10-CM | POA: Insufficient documentation

## 2015-09-18 DIAGNOSIS — E119 Type 2 diabetes mellitus without complications: Secondary | ICD-10-CM | POA: Diagnosis not present

## 2015-09-18 DIAGNOSIS — Z79899 Other long term (current) drug therapy: Secondary | ICD-10-CM | POA: Insufficient documentation

## 2015-09-18 DIAGNOSIS — C712 Malignant neoplasm of temporal lobe: Secondary | ICD-10-CM | POA: Insufficient documentation

## 2015-09-18 DIAGNOSIS — Z510 Encounter for antineoplastic radiation therapy: Secondary | ICD-10-CM | POA: Diagnosis not present

## 2015-09-18 DIAGNOSIS — E785 Hyperlipidemia, unspecified: Secondary | ICD-10-CM | POA: Diagnosis not present

## 2015-09-18 DIAGNOSIS — Z85850 Personal history of malignant neoplasm of thyroid: Secondary | ICD-10-CM | POA: Insufficient documentation

## 2015-09-18 DIAGNOSIS — I10 Essential (primary) hypertension: Secondary | ICD-10-CM | POA: Diagnosis not present

## 2015-09-19 ENCOUNTER — Ambulatory Visit
Admission: RE | Admit: 2015-09-19 | Discharge: 2015-09-19 | Disposition: A | Discharge status: Home / Self Care | Payer: Medicare Other | Source: Ambulatory Visit | Attending: Radiation Oncology | Admitting: Radiation Oncology

## 2015-09-19 DIAGNOSIS — E119 Type 2 diabetes mellitus without complications: Secondary | ICD-10-CM | POA: Diagnosis not present

## 2015-09-19 DIAGNOSIS — E785 Hyperlipidemia, unspecified: Secondary | ICD-10-CM | POA: Diagnosis not present

## 2015-09-19 DIAGNOSIS — Z510 Encounter for antineoplastic radiation therapy: Secondary | ICD-10-CM | POA: Diagnosis not present

## 2015-09-19 DIAGNOSIS — I10 Essential (primary) hypertension: Secondary | ICD-10-CM | POA: Diagnosis not present

## 2015-09-19 DIAGNOSIS — Z85850 Personal history of malignant neoplasm of thyroid: Secondary | ICD-10-CM | POA: Diagnosis not present

## 2015-09-19 DIAGNOSIS — C712 Malignant neoplasm of temporal lobe: Secondary | ICD-10-CM | POA: Diagnosis not present

## 2015-09-20 ENCOUNTER — Ambulatory Visit
Admission: RE | Admit: 2015-09-20 | Discharge: 2015-09-20 | Disposition: A | Discharge status: Home / Self Care | Payer: Medicare Other | Source: Ambulatory Visit | Attending: Radiation Oncology | Admitting: Radiation Oncology

## 2015-09-20 DIAGNOSIS — Z85850 Personal history of malignant neoplasm of thyroid: Secondary | ICD-10-CM | POA: Diagnosis not present

## 2015-09-20 DIAGNOSIS — E785 Hyperlipidemia, unspecified: Secondary | ICD-10-CM | POA: Diagnosis not present

## 2015-09-20 DIAGNOSIS — C712 Malignant neoplasm of temporal lobe: Secondary | ICD-10-CM | POA: Diagnosis not present

## 2015-09-20 DIAGNOSIS — Z510 Encounter for antineoplastic radiation therapy: Secondary | ICD-10-CM | POA: Diagnosis not present

## 2015-09-20 DIAGNOSIS — E119 Type 2 diabetes mellitus without complications: Secondary | ICD-10-CM | POA: Diagnosis not present

## 2015-09-20 DIAGNOSIS — I10 Essential (primary) hypertension: Secondary | ICD-10-CM | POA: Diagnosis not present

## 2015-09-23 ENCOUNTER — Ambulatory Visit: Payer: Medicare Other

## 2015-09-23 DIAGNOSIS — E119 Type 2 diabetes mellitus without complications: Secondary | ICD-10-CM | POA: Diagnosis not present

## 2015-09-23 DIAGNOSIS — Z510 Encounter for antineoplastic radiation therapy: Secondary | ICD-10-CM | POA: Diagnosis not present

## 2015-09-23 DIAGNOSIS — Z85850 Personal history of malignant neoplasm of thyroid: Secondary | ICD-10-CM | POA: Diagnosis not present

## 2015-09-23 DIAGNOSIS — I10 Essential (primary) hypertension: Secondary | ICD-10-CM | POA: Diagnosis not present

## 2015-09-23 DIAGNOSIS — E785 Hyperlipidemia, unspecified: Secondary | ICD-10-CM | POA: Diagnosis not present

## 2015-09-23 DIAGNOSIS — C712 Malignant neoplasm of temporal lobe: Secondary | ICD-10-CM | POA: Diagnosis not present

## 2015-09-24 ENCOUNTER — Ambulatory Visit
Admission: RE | Admit: 2015-09-24 | Discharge: 2015-09-24 | Disposition: A | Discharge status: Home / Self Care | Payer: Medicare Other | Source: Ambulatory Visit | Attending: Radiation Oncology | Admitting: Radiation Oncology

## 2015-09-24 DIAGNOSIS — I10 Essential (primary) hypertension: Secondary | ICD-10-CM | POA: Diagnosis not present

## 2015-09-24 DIAGNOSIS — E785 Hyperlipidemia, unspecified: Secondary | ICD-10-CM | POA: Diagnosis not present

## 2015-09-24 DIAGNOSIS — Z85850 Personal history of malignant neoplasm of thyroid: Secondary | ICD-10-CM | POA: Diagnosis not present

## 2015-09-24 DIAGNOSIS — C712 Malignant neoplasm of temporal lobe: Secondary | ICD-10-CM | POA: Diagnosis not present

## 2015-09-24 DIAGNOSIS — E119 Type 2 diabetes mellitus without complications: Secondary | ICD-10-CM | POA: Diagnosis not present

## 2015-09-24 DIAGNOSIS — Z510 Encounter for antineoplastic radiation therapy: Secondary | ICD-10-CM | POA: Diagnosis not present

## 2015-09-25 ENCOUNTER — Ambulatory Visit
Admission: RE | Admit: 2015-09-25 | Discharge: 2015-09-25 | Disposition: A | Discharge status: Home / Self Care | Payer: Medicare Other | Source: Ambulatory Visit | Attending: Radiation Oncology | Admitting: Radiation Oncology

## 2015-09-25 ENCOUNTER — Inpatient Hospital Stay: Payer: Medicare Other

## 2015-09-25 DIAGNOSIS — I10 Essential (primary) hypertension: Secondary | ICD-10-CM | POA: Diagnosis not present

## 2015-09-25 DIAGNOSIS — Z510 Encounter for antineoplastic radiation therapy: Secondary | ICD-10-CM | POA: Diagnosis not present

## 2015-09-25 DIAGNOSIS — E119 Type 2 diabetes mellitus without complications: Secondary | ICD-10-CM | POA: Diagnosis not present

## 2015-09-25 DIAGNOSIS — Z85850 Personal history of malignant neoplasm of thyroid: Secondary | ICD-10-CM | POA: Diagnosis not present

## 2015-09-25 DIAGNOSIS — E785 Hyperlipidemia, unspecified: Secondary | ICD-10-CM | POA: Diagnosis not present

## 2015-09-25 DIAGNOSIS — C712 Malignant neoplasm of temporal lobe: Secondary | ICD-10-CM | POA: Diagnosis not present

## 2015-09-26 ENCOUNTER — Ambulatory Visit
Admission: RE | Admit: 2015-09-26 | Discharge: 2015-09-26 | Disposition: A | Discharge status: Home / Self Care | Payer: Medicare Other | Source: Ambulatory Visit | Attending: Radiation Oncology | Admitting: Radiation Oncology

## 2015-09-26 DIAGNOSIS — C712 Malignant neoplasm of temporal lobe: Secondary | ICD-10-CM | POA: Diagnosis not present

## 2015-09-26 DIAGNOSIS — I10 Essential (primary) hypertension: Secondary | ICD-10-CM | POA: Diagnosis not present

## 2015-09-26 DIAGNOSIS — E785 Hyperlipidemia, unspecified: Secondary | ICD-10-CM | POA: Diagnosis not present

## 2015-09-26 DIAGNOSIS — Z85850 Personal history of malignant neoplasm of thyroid: Secondary | ICD-10-CM | POA: Diagnosis not present

## 2015-09-26 DIAGNOSIS — E119 Type 2 diabetes mellitus without complications: Secondary | ICD-10-CM | POA: Diagnosis not present

## 2015-09-26 DIAGNOSIS — Z510 Encounter for antineoplastic radiation therapy: Secondary | ICD-10-CM | POA: Diagnosis not present

## 2015-09-27 ENCOUNTER — Ambulatory Visit
Admission: RE | Admit: 2015-09-27 | Discharge: 2015-09-27 | Disposition: A | Discharge status: Home / Self Care | Payer: Medicare Other | Source: Ambulatory Visit | Attending: Radiation Oncology | Admitting: Radiation Oncology

## 2015-09-27 DIAGNOSIS — I10 Essential (primary) hypertension: Secondary | ICD-10-CM | POA: Diagnosis not present

## 2015-09-27 DIAGNOSIS — E119 Type 2 diabetes mellitus without complications: Secondary | ICD-10-CM | POA: Diagnosis not present

## 2015-09-27 DIAGNOSIS — Z85850 Personal history of malignant neoplasm of thyroid: Secondary | ICD-10-CM | POA: Diagnosis not present

## 2015-09-27 DIAGNOSIS — E785 Hyperlipidemia, unspecified: Secondary | ICD-10-CM | POA: Diagnosis not present

## 2015-09-27 DIAGNOSIS — Z510 Encounter for antineoplastic radiation therapy: Secondary | ICD-10-CM | POA: Diagnosis not present

## 2015-09-27 DIAGNOSIS — C712 Malignant neoplasm of temporal lobe: Secondary | ICD-10-CM | POA: Diagnosis not present

## 2015-09-29 ENCOUNTER — Other Ambulatory Visit: Payer: Self-pay | Admitting: Family Medicine

## 2015-09-30 ENCOUNTER — Ambulatory Visit
Admission: RE | Admit: 2015-09-30 | Discharge: 2015-09-30 | Disposition: A | Discharge status: Home / Self Care | Payer: Medicare Other | Source: Ambulatory Visit | Attending: Radiation Oncology | Admitting: Radiation Oncology

## 2015-09-30 DIAGNOSIS — E119 Type 2 diabetes mellitus without complications: Secondary | ICD-10-CM | POA: Diagnosis not present

## 2015-09-30 DIAGNOSIS — Z85850 Personal history of malignant neoplasm of thyroid: Secondary | ICD-10-CM | POA: Diagnosis not present

## 2015-09-30 DIAGNOSIS — C712 Malignant neoplasm of temporal lobe: Secondary | ICD-10-CM | POA: Diagnosis not present

## 2015-09-30 DIAGNOSIS — Z510 Encounter for antineoplastic radiation therapy: Secondary | ICD-10-CM | POA: Diagnosis not present

## 2015-09-30 DIAGNOSIS — E785 Hyperlipidemia, unspecified: Secondary | ICD-10-CM | POA: Diagnosis not present

## 2015-09-30 DIAGNOSIS — I10 Essential (primary) hypertension: Secondary | ICD-10-CM | POA: Diagnosis not present

## 2015-10-01 ENCOUNTER — Ambulatory Visit
Admission: RE | Admit: 2015-10-01 | Discharge: 2015-10-01 | Disposition: A | Discharge status: Home / Self Care | Payer: Medicare Other | Source: Ambulatory Visit | Attending: Radiation Oncology | Admitting: Radiation Oncology

## 2015-10-01 DIAGNOSIS — Z510 Encounter for antineoplastic radiation therapy: Secondary | ICD-10-CM | POA: Diagnosis not present

## 2015-10-01 DIAGNOSIS — C712 Malignant neoplasm of temporal lobe: Secondary | ICD-10-CM | POA: Diagnosis not present

## 2015-10-01 DIAGNOSIS — E119 Type 2 diabetes mellitus without complications: Secondary | ICD-10-CM | POA: Diagnosis not present

## 2015-10-01 DIAGNOSIS — I10 Essential (primary) hypertension: Secondary | ICD-10-CM | POA: Diagnosis not present

## 2015-10-01 DIAGNOSIS — Z85850 Personal history of malignant neoplasm of thyroid: Secondary | ICD-10-CM | POA: Diagnosis not present

## 2015-10-01 DIAGNOSIS — E785 Hyperlipidemia, unspecified: Secondary | ICD-10-CM | POA: Diagnosis not present

## 2015-10-02 ENCOUNTER — Ambulatory Visit
Admission: RE | Admit: 2015-10-02 | Discharge: 2015-10-02 | Disposition: A | Discharge status: Home / Self Care | Payer: Medicare Other | Source: Ambulatory Visit | Attending: Radiation Oncology | Admitting: Radiation Oncology

## 2015-10-02 ENCOUNTER — Inpatient Hospital Stay: Payer: Medicare Other

## 2015-10-02 DIAGNOSIS — E785 Hyperlipidemia, unspecified: Secondary | ICD-10-CM | POA: Diagnosis not present

## 2015-10-02 DIAGNOSIS — C712 Malignant neoplasm of temporal lobe: Secondary | ICD-10-CM | POA: Diagnosis not present

## 2015-10-02 DIAGNOSIS — I10 Essential (primary) hypertension: Secondary | ICD-10-CM | POA: Diagnosis not present

## 2015-10-02 DIAGNOSIS — E119 Type 2 diabetes mellitus without complications: Secondary | ICD-10-CM | POA: Diagnosis not present

## 2015-10-02 DIAGNOSIS — Z85850 Personal history of malignant neoplasm of thyroid: Secondary | ICD-10-CM | POA: Diagnosis not present

## 2015-10-02 DIAGNOSIS — Z510 Encounter for antineoplastic radiation therapy: Secondary | ICD-10-CM | POA: Diagnosis not present

## 2015-10-03 ENCOUNTER — Other Ambulatory Visit: Payer: Self-pay | Admitting: *Deleted

## 2015-10-03 ENCOUNTER — Inpatient Hospital Stay (HOSPITAL_BASED_OUTPATIENT_CLINIC_OR_DEPARTMENT_OTHER): Payer: Medicare Other | Admitting: Oncology

## 2015-10-03 ENCOUNTER — Inpatient Hospital Stay: Payer: Medicare Other

## 2015-10-03 ENCOUNTER — Ambulatory Visit
Admission: RE | Admit: 2015-10-03 | Discharge: 2015-10-03 | Disposition: A | Discharge status: Home / Self Care | Payer: Medicare Other | Source: Ambulatory Visit | Attending: Radiation Oncology | Admitting: Radiation Oncology

## 2015-10-03 VITALS — BP 107/74 | HR 101 | Temp 98.6°F | Resp 18 | Wt 163.4 lb

## 2015-10-03 DIAGNOSIS — E785 Hyperlipidemia, unspecified: Secondary | ICD-10-CM | POA: Diagnosis not present

## 2015-10-03 DIAGNOSIS — Z85850 Personal history of malignant neoplasm of thyroid: Secondary | ICD-10-CM

## 2015-10-03 DIAGNOSIS — Z87891 Personal history of nicotine dependence: Secondary | ICD-10-CM | POA: Diagnosis not present

## 2015-10-03 DIAGNOSIS — Z79899 Other long term (current) drug therapy: Secondary | ICD-10-CM | POA: Diagnosis not present

## 2015-10-03 DIAGNOSIS — I10 Essential (primary) hypertension: Secondary | ICD-10-CM | POA: Diagnosis not present

## 2015-10-03 DIAGNOSIS — C719 Malignant neoplasm of brain, unspecified: Secondary | ICD-10-CM

## 2015-10-03 DIAGNOSIS — Z510 Encounter for antineoplastic radiation therapy: Secondary | ICD-10-CM | POA: Diagnosis not present

## 2015-10-03 DIAGNOSIS — C712 Malignant neoplasm of temporal lobe: Secondary | ICD-10-CM

## 2015-10-03 DIAGNOSIS — E119 Type 2 diabetes mellitus without complications: Secondary | ICD-10-CM | POA: Diagnosis not present

## 2015-10-03 DIAGNOSIS — F329 Major depressive disorder, single episode, unspecified: Secondary | ICD-10-CM | POA: Diagnosis not present

## 2015-10-03 LAB — CBC WITH DIFFERENTIAL/PLATELET
BASOS ABS: 0.1 10*3/uL (ref 0–0.1)
BASOS PCT: 1 %
EOS PCT: 1 %
Eosinophils Absolute: 0.1 10*3/uL (ref 0–0.7)
HCT: 34.1 % — ABNORMAL LOW (ref 35.0–47.0)
Hemoglobin: 11.6 g/dL — ABNORMAL LOW (ref 12.0–16.0)
Lymphocytes Relative: 13 %
Lymphs Abs: 1.3 10*3/uL (ref 1.0–3.6)
MCH: 32.3 pg (ref 26.0–34.0)
MCHC: 34 g/dL (ref 32.0–36.0)
MCV: 94.9 fL (ref 80.0–100.0)
MONO ABS: 0.9 10*3/uL (ref 0.2–0.9)
Monocytes Relative: 9 %
Neutro Abs: 8.2 10*3/uL — ABNORMAL HIGH (ref 1.4–6.5)
Neutrophils Relative %: 76 %
PLATELETS: 199 10*3/uL (ref 150–440)
RBC: 3.59 MIL/uL — ABNORMAL LOW (ref 3.80–5.20)
RDW: 14.4 % (ref 11.5–14.5)
WBC: 10.7 10*3/uL (ref 3.6–11.0)

## 2015-10-03 LAB — COMPREHENSIVE METABOLIC PANEL
ALBUMIN: 4 g/dL (ref 3.5–5.0)
ALT: 36 U/L (ref 14–54)
ANION GAP: 8 (ref 5–15)
AST: 17 U/L (ref 15–41)
Alkaline Phosphatase: 70 U/L (ref 38–126)
BILIRUBIN TOTAL: 0.8 mg/dL (ref 0.3–1.2)
BUN: 26 mg/dL — AB (ref 6–20)
CO2: 24 mmol/L (ref 22–32)
CREATININE: 1.05 mg/dL — AB (ref 0.44–1.00)
Calcium: 9.4 mg/dL (ref 8.9–10.3)
Chloride: 106 mmol/L (ref 101–111)
GFR calc Af Amer: >60 mL/min (ref >60)
GFR calc non Af Amer: 53 mL/min — ABNORMAL LOW (ref >60)
Glucose, Bld: 198 mg/dL — ABNORMAL HIGH (ref 65–99)
POTASSIUM: 3.6 mmol/L (ref 3.5–5.1)
SODIUM: 138 mmol/L (ref 135–145)
TOTAL PROTEIN: 6.7 g/dL (ref 6.5–8.1)

## 2015-10-03 LAB — MAGNESIUM: MAGNESIUM: 1.3 mg/dL — AB (ref 1.7–2.4)

## 2015-10-03 MED ORDER — MELOXICAM 15 MG PO TABS: 15.0000 mg | ORAL_TABLET | Freq: Every day | ORAL | Status: DC

## 2015-10-03 MED ORDER — HYDROCODONE-ACETAMINOPHEN 5-325 MG PO TABS: 1.0000 | ORAL_TABLET | Freq: Four times a day (QID) | ORAL | Status: DC | PRN

## 2015-10-03 NOTE — Progress Notes (Signed)
Patient has injured her right foot and is asking for pain medication Norco 5/325.  States she had a few pills left from a previous prescription given to her a year ago but would like new prescription.

## 2015-10-04 ENCOUNTER — Ambulatory Visit
Admission: RE | Admit: 2015-10-04 | Discharge: 2015-10-04 | Disposition: A | Discharge status: Home / Self Care | Payer: Medicare Other | Source: Ambulatory Visit | Attending: Radiation Oncology | Admitting: Radiation Oncology

## 2015-10-04 DIAGNOSIS — E785 Hyperlipidemia, unspecified: Secondary | ICD-10-CM | POA: Diagnosis not present

## 2015-10-04 DIAGNOSIS — Z510 Encounter for antineoplastic radiation therapy: Secondary | ICD-10-CM | POA: Diagnosis not present

## 2015-10-04 DIAGNOSIS — C712 Malignant neoplasm of temporal lobe: Secondary | ICD-10-CM | POA: Diagnosis not present

## 2015-10-04 DIAGNOSIS — E119 Type 2 diabetes mellitus without complications: Secondary | ICD-10-CM | POA: Diagnosis not present

## 2015-10-04 DIAGNOSIS — I10 Essential (primary) hypertension: Secondary | ICD-10-CM | POA: Diagnosis not present

## 2015-10-04 DIAGNOSIS — Z85850 Personal history of malignant neoplasm of thyroid: Secondary | ICD-10-CM | POA: Diagnosis not present

## 2015-10-06 ENCOUNTER — Encounter: Payer: Self-pay | Admitting: Oncology
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
## 2015-10-07 ENCOUNTER — Telehealth: Payer: Self-pay | Admitting: *Deleted

## 2015-10-07 ENCOUNTER — Ambulatory Visit
Admission: RE | Admit: 2015-10-07 | Discharge: 2015-10-07 | Disposition: A | Discharge status: Home / Self Care | Payer: Medicare Other | Source: Ambulatory Visit | Attending: Radiation Oncology | Admitting: Radiation Oncology

## 2015-10-07 ENCOUNTER — Telehealth: Payer: Self-pay | Admitting: Family Medicine

## 2015-10-07 DIAGNOSIS — E119 Type 2 diabetes mellitus without complications: Secondary | ICD-10-CM | POA: Diagnosis not present

## 2015-10-07 DIAGNOSIS — I10 Essential (primary) hypertension: Secondary | ICD-10-CM | POA: Diagnosis not present

## 2015-10-07 DIAGNOSIS — C712 Malignant neoplasm of temporal lobe: Secondary | ICD-10-CM | POA: Diagnosis not present

## 2015-10-07 DIAGNOSIS — Z510 Encounter for antineoplastic radiation therapy: Secondary | ICD-10-CM | POA: Diagnosis not present

## 2015-10-07 DIAGNOSIS — E785 Hyperlipidemia, unspecified: Secondary | ICD-10-CM | POA: Diagnosis not present

## 2015-10-07 DIAGNOSIS — Z85850 Personal history of malignant neoplasm of thyroid: Secondary | ICD-10-CM | POA: Diagnosis not present

## 2015-10-07 NOTE — Telephone Encounter (Signed)
Pt requesting refill of norco. Last prescription given on 2/23 with #30. At this time, refill request is too early. Pt states has been taking medication more than prescribed for gout. Per Magda Paganini, NP pt needs to contact PCP for management regarding gout. Informed pt of NP recommendations and that will check with patient on 2/28 when comes to get IV mag infusion. Pt verbalized understanding.

## 2015-10-07 NOTE — Telephone Encounter (Signed)
Pt request that Mikki Santee call her back after 3 pm today. Pt would like to discuss her recent hospital visit and possibly getting a refill on her gout medication that she had last year. Thanks TNP

## 2015-10-07 NOTE — Telephone Encounter (Signed)
Pt called back to speak with Mikki Santee. I advised that Mikki Santee was with a pt. Pt stated that she needed the medication for her gout b/c she was having a flare up. I asked what the name of the medication was. Pt advise it was hydrocodone. I advised that I didn't have that on the list of medication we have written for her in the past. (I looked in both EPIC & All Script[facility]) I advised pt that maybe it would be best if she came in for an [place]. Pt scheduled appt for tomorrow 10/08/15 afternoon. Thanks TNP

## 2015-10-07 NOTE — Telephone Encounter (Signed)
Please advise. KW 

## 2015-10-08 ENCOUNTER — Inpatient Hospital Stay: Payer: Medicare Other

## 2015-10-08 ENCOUNTER — Other Ambulatory Visit: Payer: Self-pay | Admitting: *Deleted

## 2015-10-08 ENCOUNTER — Ambulatory Visit: Payer: Medicare Other | Admitting: Family Medicine

## 2015-10-08 ENCOUNTER — Ambulatory Visit
Admission: RE | Admit: 2015-10-08 | Discharge: 2015-10-08 | Disposition: A | Discharge status: Home / Self Care | Payer: Medicare Other | Source: Ambulatory Visit | Attending: Radiation Oncology | Admitting: Radiation Oncology

## 2015-10-08 VITALS — BP 108/72 | HR 99 | Temp 97.4°F

## 2015-10-08 DIAGNOSIS — E785 Hyperlipidemia, unspecified: Secondary | ICD-10-CM | POA: Diagnosis not present

## 2015-10-08 DIAGNOSIS — C712 Malignant neoplasm of temporal lobe: Secondary | ICD-10-CM | POA: Diagnosis not present

## 2015-10-08 DIAGNOSIS — Z85850 Personal history of malignant neoplasm of thyroid: Secondary | ICD-10-CM | POA: Diagnosis not present

## 2015-10-08 DIAGNOSIS — Z510 Encounter for antineoplastic radiation therapy: Secondary | ICD-10-CM | POA: Diagnosis not present

## 2015-10-08 DIAGNOSIS — C719 Malignant neoplasm of brain, unspecified: Secondary | ICD-10-CM

## 2015-10-08 DIAGNOSIS — I10 Essential (primary) hypertension: Secondary | ICD-10-CM | POA: Diagnosis not present

## 2015-10-08 DIAGNOSIS — F329 Major depressive disorder, single episode, unspecified: Secondary | ICD-10-CM | POA: Diagnosis not present

## 2015-10-08 DIAGNOSIS — E119 Type 2 diabetes mellitus without complications: Secondary | ICD-10-CM | POA: Diagnosis not present

## 2015-10-08 MED ORDER — SODIUM CHLORIDE 0.9 % IV SOLN
INTRAVENOUS | Status: DC
  Administered 2015-10-08: 11:00:00 via INTRAVENOUS
  Filled 2015-10-08: qty 1000

## 2015-10-08 MED ORDER — MAGNESIUM SULFATE 4 GM/100ML IV SOLN
4.0000 g | Freq: Once | INTRAVENOUS | Status: AC
  Administered 2015-10-08: 4 g via INTRAVENOUS
  Filled 2015-10-08: qty 100

## 2015-10-09 ENCOUNTER — Ambulatory Visit (INDEPENDENT_AMBULATORY_CARE_PROVIDER_SITE_OTHER): Payer: Medicare Other | Admitting: Family Medicine

## 2015-10-09 ENCOUNTER — Inpatient Hospital Stay: Payer: Medicare Other | Attending: Oncology

## 2015-10-09 ENCOUNTER — Ambulatory Visit: Admission: RE | Admit: 2015-10-09 | Payer: Medicare Other | Source: Ambulatory Visit

## 2015-10-09 ENCOUNTER — Inpatient Hospital Stay: Payer: Medicare Other

## 2015-10-09 ENCOUNTER — Encounter: Payer: Self-pay | Admitting: Family Medicine

## 2015-10-09 VITALS — BP 106/66 | HR 83 | Temp 98.0°F | Resp 16 | Wt 175.0 lb

## 2015-10-09 DIAGNOSIS — I10 Essential (primary) hypertension: Secondary | ICD-10-CM | POA: Insufficient documentation

## 2015-10-09 DIAGNOSIS — E119 Type 2 diabetes mellitus without complications: Secondary | ICD-10-CM | POA: Insufficient documentation

## 2015-10-09 DIAGNOSIS — M10072 Idiopathic gout, left ankle and foot: Secondary | ICD-10-CM | POA: Diagnosis not present

## 2015-10-09 DIAGNOSIS — E785 Hyperlipidemia, unspecified: Secondary | ICD-10-CM | POA: Insufficient documentation

## 2015-10-09 DIAGNOSIS — C712 Malignant neoplasm of temporal lobe: Secondary | ICD-10-CM | POA: Insufficient documentation

## 2015-10-09 DIAGNOSIS — F329 Major depressive disorder, single episode, unspecified: Secondary | ICD-10-CM | POA: Insufficient documentation

## 2015-10-09 DIAGNOSIS — E860 Dehydration: Secondary | ICD-10-CM | POA: Insufficient documentation

## 2015-10-09 DIAGNOSIS — Z79899 Other long term (current) drug therapy: Secondary | ICD-10-CM | POA: Insufficient documentation

## 2015-10-09 DIAGNOSIS — R944 Abnormal results of kidney function studies: Secondary | ICD-10-CM | POA: Insufficient documentation

## 2015-10-09 DIAGNOSIS — Z87891 Personal history of nicotine dependence: Secondary | ICD-10-CM | POA: Insufficient documentation

## 2015-10-09 DIAGNOSIS — M109 Gout, unspecified: Secondary | ICD-10-CM

## 2015-10-09 DIAGNOSIS — Z85850 Personal history of malignant neoplasm of thyroid: Secondary | ICD-10-CM | POA: Insufficient documentation

## 2015-10-09 MED ORDER — COLCHICINE 0.6 MG PO TABS: 0.6000 mg | ORAL_TABLET | Freq: Two times a day (BID) | ORAL | Status: DC

## 2015-10-09 MED ORDER — HYDROCODONE-ACETAMINOPHEN 5-325 MG PO TABS: 1.0000 | ORAL_TABLET | ORAL | Status: DC | PRN

## 2015-10-09 NOTE — Progress Notes (Signed)
Subjective:     Patient ID: Martha [name], female   DOB: 1945/10/21, 70 y.o.   MRN: [id number]  Foot Pain Patient comes in office today with concerns of pain and swelling of her left foot, patient denies any injury or trauma to foot. Patient reports she has a history of Gout and has recently changed things in her diet that she believes may have caused a flare, patient reports that pain has been persistent for the past seven days, she has been using a brace for mobility since she is having problems bearing weight. Patient reports that she has been taking pain medication to help but has not noticed improvement.    Knee Pain  The incident occurred 5 to 7 days ago. There was no injury mechanism. The pain is present in the left knee. The quality of the pain is described as shooting. The pain is at a severity of 10/10. The pain is severe. The pain has been worsening since onset. Pertinent negatives include no numbness. She reports no foreign bodies present. The symptoms are aggravated by movement and weight bearing. She has tried heat for the symptoms. The treatment provided no relief.     Review of Systems  Eyes: Negative.   Respiratory: Negative.   Cardiovascular: Negative.   Gastrointestinal: Positive for nausea and abdominal pain. Negative for vomiting, constipation, blood in stool, abdominal distention, anal bleeding and rectal pain.  Endocrine: Negative.   Genitourinary: Negative.   Musculoskeletal: Positive for myalgias, back pain and joint swelling.  Skin: Negative.   Allergic/Immunologic: Negative.   Neurological: Positive for weakness, light-headedness and headaches. Negative for dizziness, tremors, seizures, syncope, facial asymmetry, speech difficulty and numbness.  Psychiatric/Behavioral: Negative.      Filed Vitals:   10/09/15 1429  BP: 106/66  Pulse: 83  Temp: 98 F (36.7 C)  Resp: 16       Objective:   Physical Exam  Constitutional: She is oriented to person, place,  and time. She appears well-developed and well-nourished.  HENT:  Head: Normocephalic and atraumatic.  Right Ear: External ear normal.  Left Ear: External ear normal.  Nose: Nose normal.  Eyes: Conjunctivae are normal.  Neck: Neck supple.  Cardiovascular: Normal rate, regular rhythm and normal heart sounds.   Pulmonary/Chest: Effort normal and breath sounds normal.  Abdominal: Soft.  Musculoskeletal: She exhibits edema. She exhibits no tenderness.  Mild diffuse swelling of the left foot and ankle which has been controlled with the walking boot. He really has no erythema or warmth today  Neurological: She is alert and oriented to person, place, and time.  Skin: Skin is warm and dry.  Psychiatric: She has a normal mood and affect. Her behavior is normal. Judgment and thought content normal.       Assessment:    1. Acute gout of left ankle, unspecified cause Treat as gout with colchicine. Start twice a day. Sitter prednisone. Consider on follow-up check and a uric acid level and treating with allopurinol. Patient will follow-up with her primary care next week with Mariel Sleet PAC  2. Glioblastoma multiforme of the right temporal lobe.   she absolutely could be having some cognitive problems due to the space-occupying lesion. She has follow-up with oncology and radiation oncology also. Plan:

## 2015-10-10 ENCOUNTER — Ambulatory Visit
Admission: RE | Admit: 2015-10-10 | Discharge: 2015-10-10 | Disposition: A | Discharge status: Home / Self Care | Payer: Medicare Other | Source: Ambulatory Visit | Attending: Radiation Oncology | Admitting: Radiation Oncology

## 2015-10-10 ENCOUNTER — Ambulatory Visit: Payer: Medicare Other | Admitting: Family Medicine

## 2015-10-10 DIAGNOSIS — E785 Hyperlipidemia, unspecified: Secondary | ICD-10-CM | POA: Diagnosis not present

## 2015-10-10 DIAGNOSIS — Z510 Encounter for antineoplastic radiation therapy: Secondary | ICD-10-CM | POA: Diagnosis not present

## 2015-10-10 DIAGNOSIS — C712 Malignant neoplasm of temporal lobe: Secondary | ICD-10-CM | POA: Diagnosis not present

## 2015-10-10 DIAGNOSIS — I10 Essential (primary) hypertension: Secondary | ICD-10-CM | POA: Diagnosis not present

## 2015-10-10 DIAGNOSIS — Z85850 Personal history of malignant neoplasm of thyroid: Secondary | ICD-10-CM | POA: Diagnosis not present

## 2015-10-10 DIAGNOSIS — E119 Type 2 diabetes mellitus without complications: Secondary | ICD-10-CM | POA: Diagnosis not present

## 2015-10-11 ENCOUNTER — Ambulatory Visit
Admission: RE | Admit: 2015-10-11 | Discharge: 2015-10-11 | Disposition: A | Discharge status: Home / Self Care | Payer: Medicare Other | Source: Ambulatory Visit | Attending: Radiation Oncology | Admitting: Radiation Oncology

## 2015-10-11 DIAGNOSIS — E119 Type 2 diabetes mellitus without complications: Secondary | ICD-10-CM | POA: Diagnosis not present

## 2015-10-11 DIAGNOSIS — Z510 Encounter for antineoplastic radiation therapy: Secondary | ICD-10-CM | POA: Diagnosis not present

## 2015-10-11 DIAGNOSIS — I10 Essential (primary) hypertension: Secondary | ICD-10-CM | POA: Diagnosis not present

## 2015-10-11 DIAGNOSIS — C712 Malignant neoplasm of temporal lobe: Secondary | ICD-10-CM | POA: Diagnosis not present

## 2015-10-11 DIAGNOSIS — E785 Hyperlipidemia, unspecified: Secondary | ICD-10-CM | POA: Diagnosis not present

## 2015-10-11 DIAGNOSIS — Z85850 Personal history of malignant neoplasm of thyroid: Secondary | ICD-10-CM | POA: Diagnosis not present

## 2015-10-14 ENCOUNTER — Ambulatory Visit
Admission: RE | Admit: 2015-10-14 | Discharge: 2015-10-14 | Disposition: A | Discharge status: Home / Self Care | Payer: Medicare Other | Source: Ambulatory Visit | Attending: Radiation Oncology | Admitting: Radiation Oncology

## 2015-10-14 DIAGNOSIS — Z510 Encounter for antineoplastic radiation therapy: Secondary | ICD-10-CM | POA: Diagnosis not present

## 2015-10-14 DIAGNOSIS — E119 Type 2 diabetes mellitus without complications: Secondary | ICD-10-CM | POA: Diagnosis not present

## 2015-10-14 DIAGNOSIS — C712 Malignant neoplasm of temporal lobe: Secondary | ICD-10-CM | POA: Diagnosis not present

## 2015-10-14 DIAGNOSIS — Z85850 Personal history of malignant neoplasm of thyroid: Secondary | ICD-10-CM | POA: Diagnosis not present

## 2015-10-14 DIAGNOSIS — I10 Essential (primary) hypertension: Secondary | ICD-10-CM | POA: Diagnosis not present

## 2015-10-14 DIAGNOSIS — E785 Hyperlipidemia, unspecified: Secondary | ICD-10-CM | POA: Diagnosis not present

## 2015-10-15 ENCOUNTER — Encounter: Payer: Self-pay | Admitting: *Deleted

## 2015-10-15 ENCOUNTER — Ambulatory Visit
Admission: RE | Admit: 2015-10-15 | Discharge: 2015-10-15 | Disposition: A | Discharge status: Home / Self Care | Payer: Medicare Other | Source: Ambulatory Visit | Attending: Radiation Oncology | Admitting: Radiation Oncology

## 2015-10-15 ENCOUNTER — Other Ambulatory Visit: Payer: Self-pay | Admitting: Oncology

## 2015-10-15 ENCOUNTER — Other Ambulatory Visit: Payer: Self-pay | Admitting: *Deleted

## 2015-10-15 ENCOUNTER — Inpatient Hospital Stay (HOSPITAL_BASED_OUTPATIENT_CLINIC_OR_DEPARTMENT_OTHER): Payer: Medicare Other | Admitting: Oncology

## 2015-10-15 ENCOUNTER — Inpatient Hospital Stay: Payer: Medicare Other

## 2015-10-15 ENCOUNTER — Ambulatory Visit: Payer: Medicare Other

## 2015-10-15 VITALS — BP 77/57 | HR 109 | Temp 97.3°F | Resp 18 | Wt 156.4 lb

## 2015-10-15 DIAGNOSIS — Z79899 Other long term (current) drug therapy: Secondary | ICD-10-CM

## 2015-10-15 DIAGNOSIS — C719 Malignant neoplasm of brain, unspecified: Secondary | ICD-10-CM

## 2015-10-15 DIAGNOSIS — Z87891 Personal history of nicotine dependence: Secondary | ICD-10-CM | POA: Diagnosis not present

## 2015-10-15 DIAGNOSIS — R944 Abnormal results of kidney function studies: Secondary | ICD-10-CM | POA: Diagnosis not present

## 2015-10-15 DIAGNOSIS — C712 Malignant neoplasm of temporal lobe: Secondary | ICD-10-CM

## 2015-10-15 DIAGNOSIS — Z85850 Personal history of malignant neoplasm of thyroid: Secondary | ICD-10-CM

## 2015-10-15 DIAGNOSIS — F329 Major depressive disorder, single episode, unspecified: Secondary | ICD-10-CM | POA: Diagnosis not present

## 2015-10-15 DIAGNOSIS — I10 Essential (primary) hypertension: Secondary | ICD-10-CM | POA: Diagnosis not present

## 2015-10-15 DIAGNOSIS — E1151 Type 2 diabetes mellitus with diabetic peripheral angiopathy without gangrene: Secondary | ICD-10-CM

## 2015-10-15 DIAGNOSIS — E785 Hyperlipidemia, unspecified: Secondary | ICD-10-CM | POA: Diagnosis not present

## 2015-10-15 DIAGNOSIS — I70209 Unspecified atherosclerosis of native arteries of extremities, unspecified extremity: Secondary | ICD-10-CM

## 2015-10-15 DIAGNOSIS — Z510 Encounter for antineoplastic radiation therapy: Secondary | ICD-10-CM | POA: Diagnosis not present

## 2015-10-15 DIAGNOSIS — E860 Dehydration: Secondary | ICD-10-CM

## 2015-10-15 DIAGNOSIS — E119 Type 2 diabetes mellitus without complications: Secondary | ICD-10-CM | POA: Diagnosis not present

## 2015-10-15 LAB — COMPREHENSIVE METABOLIC PANEL
ALBUMIN: 4.2 g/dL (ref 3.5–5.0)
ALK PHOS: 96 U/L (ref 38–126)
ALT: 63 U/L — AB (ref 14–54)
ANION GAP: 9 (ref 5–15)
AST: 38 U/L (ref 15–41)
BILIRUBIN TOTAL: 0.8 mg/dL (ref 0.3–1.2)
BUN: 51 mg/dL — AB (ref 6–20)
CALCIUM: 9.6 mg/dL (ref 8.9–10.3)
CO2: 23 mmol/L (ref 22–32)
CREATININE: 1.72 mg/dL — AB (ref 0.44–1.00)
Chloride: 106 mmol/L (ref 101–111)
GFR calc Af Amer: 34 mL/min — ABNORMAL LOW (ref >60)
GFR calc non Af Amer: 29 mL/min — ABNORMAL LOW (ref >60)
GLUCOSE: 102 mg/dL — AB (ref 65–99)
Potassium: 4.1 mmol/L (ref 3.5–5.1)
Sodium: 138 mmol/L (ref 135–145)
TOTAL PROTEIN: 7.1 g/dL (ref 6.5–8.1)

## 2015-10-15 LAB — CBC WITH DIFFERENTIAL/PLATELET
BASOS PCT: 1 %
Basophils Absolute: 0.1 10*3/uL (ref 0–0.1)
Eosinophils Absolute: 0.2 10*3/uL (ref 0–0.7)
Eosinophils Relative: 2 %
HEMATOCRIT: 39.3 % (ref 35.0–47.0)
HEMOGLOBIN: 13.5 g/dL (ref 12.0–16.0)
LYMPHS ABS: 1.8 10*3/uL (ref 1.0–3.6)
Lymphocytes Relative: 21 %
MCH: 32.9 pg (ref 26.0–34.0)
MCHC: 34.3 g/dL (ref 32.0–36.0)
MCV: 96.1 fL (ref 80.0–100.0)
MONOS PCT: 10 %
Monocytes Absolute: 0.8 10*3/uL (ref 0.2–0.9)
NEUTROS ABS: 5.6 10*3/uL (ref 1.4–6.5)
NEUTROS PCT: 66 %
Platelets: 350 10*3/uL (ref 150–440)
RBC: 4.09 MIL/uL (ref 3.80–5.20)
RDW: 13.6 % (ref 11.5–14.5)
WBC: 8.5 10*3/uL (ref 3.6–11.0)

## 2015-10-15 MED ORDER — SODIUM CHLORIDE 0.9 % IV SOLN
Freq: Once | INTRAVENOUS | Status: AC
  Administered 2015-10-15: 16:00:00 via INTRAVENOUS
  Filled 2015-10-15: qty 1000

## 2015-10-15 NOTE — Progress Notes (Signed)
Patient here today as acute add on for nausea/vomiting.  Patient has not eaten in 3 days.  Throws up when she tries to eat.  BP 77/57  HR 109.  Patient dizzy when standing.

## 2015-10-16 ENCOUNTER — Ambulatory Visit: Admission: RE | Admit: 2015-10-16 | Payer: Medicare Other | Source: Ambulatory Visit

## 2015-10-16 ENCOUNTER — Inpatient Hospital Stay: Payer: Medicare Other

## 2015-10-16 VITALS — BP 69/50 | HR 84 | Resp 18

## 2015-10-16 DIAGNOSIS — C712 Malignant neoplasm of temporal lobe: Secondary | ICD-10-CM | POA: Diagnosis not present

## 2015-10-16 DIAGNOSIS — Z85850 Personal history of malignant neoplasm of thyroid: Secondary | ICD-10-CM | POA: Diagnosis not present

## 2015-10-16 DIAGNOSIS — C719 Malignant neoplasm of brain, unspecified: Secondary | ICD-10-CM

## 2015-10-16 DIAGNOSIS — R944 Abnormal results of kidney function studies: Secondary | ICD-10-CM | POA: Diagnosis not present

## 2015-10-16 DIAGNOSIS — E860 Dehydration: Secondary | ICD-10-CM | POA: Diagnosis not present

## 2015-10-16 DIAGNOSIS — Z79899 Other long term (current) drug therapy: Secondary | ICD-10-CM | POA: Diagnosis not present

## 2015-10-16 DIAGNOSIS — I10 Essential (primary) hypertension: Secondary | ICD-10-CM | POA: Diagnosis not present

## 2015-10-16 LAB — CBC WITH DIFFERENTIAL/PLATELET
BASOS PCT: 1 %
Basophils Absolute: 0.1 10*3/uL (ref 0–0.1)
Eosinophils Absolute: 0.1 10*3/uL (ref 0–0.7)
Eosinophils Relative: 1 %
HEMATOCRIT: 36.2 % (ref 35.0–47.0)
Hemoglobin: 12.3 g/dL (ref 12.0–16.0)
LYMPHS ABS: 0.8 10*3/uL — AB (ref 1.0–3.6)
Lymphocytes Relative: 8 %
MCH: 32.7 pg (ref 26.0–34.0)
MCHC: 34.1 g/dL (ref 32.0–36.0)
MCV: 95.8 fL (ref 80.0–100.0)
MONO ABS: 0.3 10*3/uL (ref 0.2–0.9)
MONOS PCT: 3 %
NEUTROS ABS: 8.5 10*3/uL — AB (ref 1.4–6.5)
Neutrophils Relative %: 87 %
Platelets: 297 10*3/uL (ref 150–440)
RBC: 3.78 MIL/uL — ABNORMAL LOW (ref 3.80–5.20)
RDW: 13.7 % (ref 11.5–14.5)
WBC: 9.7 10*3/uL (ref 3.6–11.0)

## 2015-10-16 MED ORDER — TEMOZOLOMIDE 20 MG PO CAPS: 20.0000 mg | ORAL_CAPSULE | Freq: Every day | ORAL | Status: DC

## 2015-10-16 MED ORDER — DEXAMETHASONE 2 MG PO TABS: ORAL_TABLET | ORAL | Status: DC

## 2015-10-16 MED ORDER — PROMETHAZINE HCL 25 MG PO TABS: 25.0000 mg | ORAL_TABLET | Freq: Four times a day (QID) | ORAL | Status: DC | PRN

## 2015-10-16 MED ORDER — SODIUM CHLORIDE 0.9 % IV SOLN
Freq: Once | INTRAVENOUS | Status: AC
  Administered 2015-10-16: 14:00:00 via INTRAVENOUS
  Filled 2015-10-16: qty 1000

## 2015-10-16 MED ORDER — TEMOZOLOMIDE 100 MG PO CAPS: 100.0000 mg | ORAL_CAPSULE | Freq: Every day | ORAL | Status: DC

## 2015-10-17 ENCOUNTER — Ambulatory Visit (INDEPENDENT_AMBULATORY_CARE_PROVIDER_SITE_OTHER): Payer: Medicare Other | Admitting: Family Medicine

## 2015-10-17 ENCOUNTER — Other Ambulatory Visit: Payer: Medicare Other

## 2015-10-17 ENCOUNTER — Ambulatory Visit
Admission: RE | Admit: 2015-10-17 | Discharge: 2015-10-17 | Disposition: A | Discharge status: Home / Self Care | Payer: Medicare Other | Source: Ambulatory Visit | Attending: Radiation Oncology | Admitting: Radiation Oncology

## 2015-10-17 ENCOUNTER — Encounter: Payer: Self-pay | Admitting: Family Medicine

## 2015-10-17 ENCOUNTER — Inpatient Hospital Stay: Payer: Medicare Other | Admitting: Oncology

## 2015-10-17 VITALS — BP 102/60 | HR 64 | Temp 97.7°F | Resp 16

## 2015-10-17 DIAGNOSIS — C712 Malignant neoplasm of temporal lobe: Secondary | ICD-10-CM | POA: Diagnosis not present

## 2015-10-17 DIAGNOSIS — N184 Chronic kidney disease, stage 4 (severe): Secondary | ICD-10-CM | POA: Diagnosis not present

## 2015-10-17 DIAGNOSIS — Z510 Encounter for antineoplastic radiation therapy: Secondary | ICD-10-CM | POA: Diagnosis not present

## 2015-10-17 DIAGNOSIS — M109 Gout, unspecified: Secondary | ICD-10-CM

## 2015-10-17 DIAGNOSIS — M10072 Idiopathic gout, left ankle and foot: Secondary | ICD-10-CM

## 2015-10-17 DIAGNOSIS — E119 Type 2 diabetes mellitus without complications: Secondary | ICD-10-CM | POA: Diagnosis not present

## 2015-10-17 DIAGNOSIS — Z85850 Personal history of malignant neoplasm of thyroid: Secondary | ICD-10-CM | POA: Diagnosis not present

## 2015-10-17 DIAGNOSIS — I10 Essential (primary) hypertension: Secondary | ICD-10-CM | POA: Diagnosis not present

## 2015-10-17 DIAGNOSIS — M222X2 Patellofemoral disorders, left knee: Secondary | ICD-10-CM

## 2015-10-17 DIAGNOSIS — E785 Hyperlipidemia, unspecified: Secondary | ICD-10-CM | POA: Diagnosis not present

## 2015-10-17 MED ORDER — COLCHICINE 0.6 MG PO TABS: ORAL_TABLET | ORAL | Status: DC

## 2015-10-17 NOTE — Progress Notes (Signed)
Subjective:     Patient ID: Martha [name], female   DOB: 28-Dec-1945, 70 y.o.   MRN: [id number]  HPI  Chief Complaint  Patient presents with  . Gout    Patient is present in office today for follow up last offic visit was 10/09/15 patient was treated with colchicine and hydrocodone. Today paitient reports that swelling in ankle has improved  . Knee Pain    Follow up from 10/09/15 patient reports that she is still having pain in her knee that is radiating up lateral side of her leg.   Reports she goes up and down stairs at her home and must keep her knee flexed when receiving radiation therapy for 20 minutes. Continues to undergo radiation and chemo Rx for a brain tumor. Currently will also be on Decadron for a week starting 10/16/15. On review of her labs her CKD has declined to level 4.  She is accompanied by a friend who has driven her. Currently lives alone.   Review of Systems     Objective:   Physical Exam  Constitutional: She appears well-developed and well-nourished. No distress (sitting in a wheelchair.).  Musculoskeletal:  Tender over her left patella; no overlying erythema or effusion. FROM with crepitus noted. Ligaments stable.       Assessment:    1. Patellofemoral syndrome, left  2. CKD (chronic kidney disease) stage 4, GFR 15-29 ml/min ([place]): start renal dosing of routine medication. Avoid nsaid's.  3. Acute gout of left ankle, unspecified cause: improved. Continue colchicine at renal dose for prophylaxis    Plan:    Discussed icing her knee after she gets home from radiation therapy and may continue hydrocodone. Suggested her current steroid treatment may help. Also topical application of Zostrix. Consider orthopedic consultation. Will d/c metformin and monitor sugars.

## 2015-10-17 NOTE — Patient Instructions (Addendum)
Stop metformin. Stop Mobic and avoid other similar drugs like ibuprofen and Aleve. Use Hydrocodone for pain. Ice down your knee for 20 minutes. Consider Zostrix cream available over the counter. Change Colchicine to one pill ever 2-3 day to keep gout away.

## 2015-10-18 ENCOUNTER — Ambulatory Visit
Admission: RE | Admit: 2015-10-18 | Discharge: 2015-10-18 | Disposition: A | Discharge status: Home / Self Care | Payer: Medicare Other | Source: Ambulatory Visit | Attending: Radiation Oncology | Admitting: Radiation Oncology

## 2015-10-18 DIAGNOSIS — I10 Essential (primary) hypertension: Secondary | ICD-10-CM | POA: Diagnosis not present

## 2015-10-18 DIAGNOSIS — Z85850 Personal history of malignant neoplasm of thyroid: Secondary | ICD-10-CM | POA: Diagnosis not present

## 2015-10-18 DIAGNOSIS — C712 Malignant neoplasm of temporal lobe: Secondary | ICD-10-CM | POA: Diagnosis not present

## 2015-10-18 DIAGNOSIS — E119 Type 2 diabetes mellitus without complications: Secondary | ICD-10-CM | POA: Diagnosis not present

## 2015-10-18 DIAGNOSIS — E785 Hyperlipidemia, unspecified: Secondary | ICD-10-CM | POA: Diagnosis not present

## 2015-10-18 DIAGNOSIS — Z510 Encounter for antineoplastic radiation therapy: Secondary | ICD-10-CM | POA: Diagnosis not present

## 2015-10-21 ENCOUNTER — Other Ambulatory Visit: Payer: Self-pay | Admitting: *Deleted

## 2015-10-21 ENCOUNTER — Ambulatory Visit: Payer: Medicare Other

## 2015-10-21 ENCOUNTER — Encounter: Payer: Self-pay | Admitting: Oncology

## 2015-10-21 DIAGNOSIS — C719 Malignant neoplasm of brain, unspecified: Secondary | ICD-10-CM
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
## 2015-10-22 ENCOUNTER — Ambulatory Visit: Payer: Medicare Other

## 2015-10-22 ENCOUNTER — Ambulatory Visit
Admission: RE | Admit: 2015-10-22 | Discharge: 2015-10-22 | Disposition: A | Discharge status: Home / Self Care | Payer: Medicare Other | Source: Ambulatory Visit | Attending: Radiation Oncology | Admitting: Radiation Oncology

## 2015-10-22 ENCOUNTER — Inpatient Hospital Stay: Payer: Medicare Other

## 2015-10-22 DIAGNOSIS — I10 Essential (primary) hypertension: Secondary | ICD-10-CM | POA: Diagnosis not present

## 2015-10-22 DIAGNOSIS — E119 Type 2 diabetes mellitus without complications: Secondary | ICD-10-CM | POA: Diagnosis not present

## 2015-10-22 DIAGNOSIS — Z85850 Personal history of malignant neoplasm of thyroid: Secondary | ICD-10-CM | POA: Diagnosis not present

## 2015-10-22 DIAGNOSIS — E785 Hyperlipidemia, unspecified: Secondary | ICD-10-CM | POA: Diagnosis not present

## 2015-10-22 DIAGNOSIS — Z510 Encounter for antineoplastic radiation therapy: Secondary | ICD-10-CM | POA: Diagnosis not present

## 2015-10-22 DIAGNOSIS — C712 Malignant neoplasm of temporal lobe: Secondary | ICD-10-CM | POA: Diagnosis not present

## 2015-10-23 ENCOUNTER — Ambulatory Visit: Payer: Medicare Other

## 2015-10-23 ENCOUNTER — Other Ambulatory Visit: Payer: Self-pay | Admitting: *Deleted

## 2015-10-23 ENCOUNTER — Ambulatory Visit
Admission: RE | Admit: 2015-10-23 | Discharge: 2015-10-23 | Disposition: A | Discharge status: Home / Self Care | Payer: Medicare Other | Source: Ambulatory Visit | Attending: Radiation Oncology | Admitting: Radiation Oncology

## 2015-10-23 DIAGNOSIS — C719 Malignant neoplasm of brain, unspecified: Secondary | ICD-10-CM

## 2015-10-23 DIAGNOSIS — Z85850 Personal history of malignant neoplasm of thyroid: Secondary | ICD-10-CM | POA: Diagnosis not present

## 2015-10-23 DIAGNOSIS — E785 Hyperlipidemia, unspecified: Secondary | ICD-10-CM | POA: Diagnosis not present

## 2015-10-23 DIAGNOSIS — C712 Malignant neoplasm of temporal lobe: Secondary | ICD-10-CM | POA: Diagnosis not present

## 2015-10-23 DIAGNOSIS — I10 Essential (primary) hypertension: Secondary | ICD-10-CM | POA: Diagnosis not present

## 2015-10-23 DIAGNOSIS — E119 Type 2 diabetes mellitus without complications: Secondary | ICD-10-CM | POA: Diagnosis not present

## 2015-10-23 DIAGNOSIS — Z510 Encounter for antineoplastic radiation therapy: Secondary | ICD-10-CM | POA: Diagnosis not present

## 2015-10-23 MED ORDER — HYDROCODONE-ACETAMINOPHEN 5-325 MG PO TABS: 1.0000 | ORAL_TABLET | Freq: Four times a day (QID) | ORAL | Status: DC | PRN

## 2015-10-23 NOTE — Telephone Encounter (Signed)
Informed that prescription is ready to pick up  

## 2015-10-24 ENCOUNTER — Ambulatory Visit: Payer: Medicare Other

## 2015-10-24 ENCOUNTER — Ambulatory Visit
Admission: RE | Admit: 2015-10-24 | Discharge: 2015-10-24 | Disposition: A | Discharge status: Home / Self Care | Payer: Medicare Other | Source: Ambulatory Visit | Attending: Radiation Oncology | Admitting: Radiation Oncology

## 2015-10-24 DIAGNOSIS — E785 Hyperlipidemia, unspecified: Secondary | ICD-10-CM | POA: Diagnosis not present

## 2015-10-24 DIAGNOSIS — I10 Essential (primary) hypertension: Secondary | ICD-10-CM | POA: Diagnosis not present

## 2015-10-24 DIAGNOSIS — Z510 Encounter for antineoplastic radiation therapy: Secondary | ICD-10-CM | POA: Diagnosis not present

## 2015-10-24 DIAGNOSIS — E119 Type 2 diabetes mellitus without complications: Secondary | ICD-10-CM | POA: Diagnosis not present

## 2015-10-24 DIAGNOSIS — Z85850 Personal history of malignant neoplasm of thyroid: Secondary | ICD-10-CM | POA: Diagnosis not present

## 2015-10-24 DIAGNOSIS — C712 Malignant neoplasm of temporal lobe: Secondary | ICD-10-CM | POA: Diagnosis not present

## 2015-10-25 ENCOUNTER — Ambulatory Visit
Admission: RE | Admit: 2015-10-25 | Discharge: 2015-10-25 | Disposition: A | Discharge status: Home / Self Care | Payer: Medicare Other | Source: Ambulatory Visit | Attending: Radiation Oncology | Admitting: Radiation Oncology

## 2015-10-25 DIAGNOSIS — Z85850 Personal history of malignant neoplasm of thyroid: Secondary | ICD-10-CM | POA: Diagnosis not present

## 2015-10-25 DIAGNOSIS — E785 Hyperlipidemia, unspecified: Secondary | ICD-10-CM | POA: Diagnosis not present

## 2015-10-25 DIAGNOSIS — I10 Essential (primary) hypertension: Secondary | ICD-10-CM | POA: Diagnosis not present

## 2015-10-25 DIAGNOSIS — Z510 Encounter for antineoplastic radiation therapy: Secondary | ICD-10-CM | POA: Diagnosis not present

## 2015-10-25 DIAGNOSIS — E119 Type 2 diabetes mellitus without complications: Secondary | ICD-10-CM | POA: Diagnosis not present

## 2015-10-25 DIAGNOSIS — C712 Malignant neoplasm of temporal lobe: Secondary | ICD-10-CM | POA: Diagnosis not present

## 2015-11-01 ENCOUNTER — Ambulatory Visit: Payer: Medicare Other | Admitting: Family Medicine

## 2015-11-14 ENCOUNTER — Encounter: Payer: Self-pay | Admitting: Family Medicine

## 2015-11-14 ENCOUNTER — Ambulatory Visit (INDEPENDENT_AMBULATORY_CARE_PROVIDER_SITE_OTHER): Payer: Medicare Other | Admitting: Family Medicine

## 2015-11-14 VITALS — BP 118/78 | HR 81 | Temp 98.3°F | Resp 16 | Wt 152.4 lb

## 2015-11-14 DIAGNOSIS — M222X2 Patellofemoral disorders, left knee: Secondary | ICD-10-CM

## 2015-11-14 DIAGNOSIS — E1159 Type 2 diabetes mellitus with other circulatory complications: Secondary | ICD-10-CM

## 2015-11-14 DIAGNOSIS — I70209 Unspecified atherosclerosis of native arteries of extremities, unspecified extremity: Secondary | ICD-10-CM | POA: Diagnosis not present

## 2015-11-14 DIAGNOSIS — N184 Chronic kidney disease, stage 4 (severe): Secondary | ICD-10-CM

## 2015-11-14 DIAGNOSIS — E1122 Type 2 diabetes mellitus with diabetic chronic kidney disease: Secondary | ICD-10-CM | POA: Diagnosis not present

## 2015-11-14 MED ORDER — HYDROCODONE-ACETAMINOPHEN 5-325 MG PO TABS: 1.0000 | ORAL_TABLET | Freq: Four times a day (QID) | ORAL | Status: DC | PRN

## 2015-11-14 NOTE — Progress Notes (Signed)
Subjective:     Patient ID: Martha [name], female   DOB: [birth year], 70 y.o.   MRN: [id number]  HPI  Chief Complaint  Patient presents with  . Chronic Kidney Disease    Follow up from 10/17/15 patient was discontinued off Metformin. Patient reports that she has not been checking blood sugar but states that she feels better since being of medication, patient has denied any hypoglycemia incidents.   . Gout    Patient returns for follow up visit she states that pain in her knee has greatly improved and she is taking Vicodan 5-325mg  daily. Patient reports that when she does have pain it starts right above knee and radiates to her left buttock.  States she feels well and has been eating healthier. Has appointments pending with her oncology doctors later this month. She is confused about her medication and has stopped several of them. I have suggested she at least resume levothyroxine and Keppra in addition to pain medication. She will continue to hold pravastatin, pantoprazole, and colchicine for now.  She is accompanied by a church member.   Review of Systems     Objective:   Physical Exam  Constitutional: She appears well-developed and well-nourished. No distress.  Cardiovascular: Normal rate and regular rhythm.   Pulmonary/Chest: Breath sounds normal.  Musculoskeletal: She exhibits no edema (of lower extremities).       Assessment:    1. Patellofemoral syndrome, left - HYDROcodone-acetaminophen (NORCO/VICODIN) 5-325 MG tablet; Take 1 tablet by mouth every 6 (six) hours as needed for moderate pain.  Dispense: 60 tablet; Refill: 0  2. CKD (chronic kidney disease) stage 4, GFR 15-29 ml/min ([place]): may be improved if she is eating/drinking better. - Renal function panel  3. Type 2 diabetes mellitus with stage 4 chronic kidney disease, without long-term current use of insulin ([place]): currently off medication - Renal function panel    Plan:    Further f/u pending labs. Resume chronic  medication.

## 2015-11-14 NOTE — Patient Instructions (Signed)
We will call you with the lab results. 

## 2015-11-15 LAB — RENAL FUNCTION PANEL
Albumin: 3.9 g/dL (ref 3.6–4.8)
BUN/Creatinine Ratio: 15 (ref 12–28)
BUN: 13 mg/dL (ref 8–27)
CO2: 24 mmol/L (ref 18–29)
Calcium: 9.5 mg/dL (ref 8.7–10.3)
Chloride: 107 mmol/L — ABNORMAL HIGH (ref 96–106)
Creatinine, Ser: 0.87 mg/dL (ref 0.57–1.00)
GFR, EST AFRICAN AMERICAN: 79 mL/min/{1.73_m2} (ref >59)
GFR, EST NON AFRICAN AMERICAN: 68 mL/min/{1.73_m2} (ref >59)
GLUCOSE: 93 mg/dL (ref 65–99)
POTASSIUM: 3.6 mmol/L (ref 3.5–5.2)
Phosphorus: 4.2 mg/dL (ref 2.5–4.5)
SODIUM: 148 mmol/L — AB (ref 134–144)

## 2015-11-22 ENCOUNTER — Telehealth: Payer: Self-pay | Admitting: Family Medicine

## 2015-11-22 ENCOUNTER — Ambulatory Visit: Payer: Medicare Other | Attending: Radiation Oncology

## 2015-11-22 NOTE — Telephone Encounter (Signed)
Opened in error

## 2015-11-25 ENCOUNTER — Telehealth: Payer: Self-pay | Admitting: Family Medicine

## 2015-11-26 ENCOUNTER — Inpatient Hospital Stay: Payer: Medicare Other | Admitting: Oncology

## 2015-11-26 ENCOUNTER — Inpatient Hospital Stay: Payer: Medicare Other

## 2015-11-30 ENCOUNTER — Other Ambulatory Visit: Payer: Self-pay | Admitting: Family Medicine

## 2015-12-11 ENCOUNTER — Inpatient Hospital Stay (HOSPITAL_BASED_OUTPATIENT_CLINIC_OR_DEPARTMENT_OTHER): Payer: Medicare Other | Admitting: Oncology

## 2015-12-11 ENCOUNTER — Inpatient Hospital Stay: Payer: Medicare Other | Attending: Oncology

## 2015-12-11 VITALS — BP 148/92 | HR 90 | Temp 97.4°F | Resp 18 | Wt 151.4 lb

## 2015-12-11 DIAGNOSIS — R600 Localized edema: Secondary | ICD-10-CM | POA: Diagnosis not present

## 2015-12-11 DIAGNOSIS — Z87891 Personal history of nicotine dependence: Secondary | ICD-10-CM | POA: Diagnosis not present

## 2015-12-11 DIAGNOSIS — I10 Essential (primary) hypertension: Secondary | ICD-10-CM | POA: Insufficient documentation

## 2015-12-11 DIAGNOSIS — C719 Malignant neoplasm of brain, unspecified: Secondary | ICD-10-CM

## 2015-12-11 DIAGNOSIS — Z923 Personal history of irradiation: Secondary | ICD-10-CM | POA: Diagnosis not present

## 2015-12-11 DIAGNOSIS — C712 Malignant neoplasm of temporal lobe: Secondary | ICD-10-CM | POA: Insufficient documentation

## 2015-12-11 DIAGNOSIS — M7989 Other specified soft tissue disorders: Secondary | ICD-10-CM

## 2015-12-11 DIAGNOSIS — E119 Type 2 diabetes mellitus without complications: Secondary | ICD-10-CM | POA: Insufficient documentation

## 2015-12-11 DIAGNOSIS — Z79899 Other long term (current) drug therapy: Secondary | ICD-10-CM

## 2015-12-11 LAB — COMPREHENSIVE METABOLIC PANEL
ALT: 41 U/L (ref 14–54)
ANION GAP: 8 (ref 5–15)
AST: 30 U/L (ref 15–41)
Albumin: 3.9 g/dL (ref 3.5–5.0)
Alkaline Phosphatase: 68 U/L (ref 38–126)
BUN: 15 mg/dL (ref 6–20)
CHLORIDE: 104 mmol/L (ref 101–111)
CO2: 28 mmol/L (ref 22–32)
CREATININE: 0.81 mg/dL (ref 0.44–1.00)
Calcium: 9.5 mg/dL (ref 8.9–10.3)
GFR calc non Af Amer: >60 mL/min (ref >60)
Glucose, Bld: 104 mg/dL — ABNORMAL HIGH (ref 65–99)
POTASSIUM: 3.5 mmol/L (ref 3.5–5.1)
SODIUM: 140 mmol/L (ref 135–145)
Total Bilirubin: 0.6 mg/dL (ref 0.3–1.2)
Total Protein: 6.6 g/dL (ref 6.5–8.1)

## 2015-12-11 LAB — CBC WITH DIFFERENTIAL/PLATELET
BASOS ABS: 0.1 10*3/uL (ref 0–0.1)
BASOS PCT: 1 %
EOS PCT: 2 %
Eosinophils Absolute: 0.2 10*3/uL (ref 0–0.7)
HCT: 36.5 % (ref 35.0–47.0)
Hemoglobin: 12.8 g/dL (ref 12.0–16.0)
LYMPHS PCT: 21 %
Lymphs Abs: 1.6 10*3/uL (ref 1.0–3.6)
MCH: 31.4 pg (ref 26.0–34.0)
MCHC: 35.2 g/dL (ref 32.0–36.0)
MCV: 89.2 fL (ref 80.0–100.0)
MONO ABS: 0.6 10*3/uL (ref 0.2–0.9)
Monocytes Relative: 7 %
Neutro Abs: 5.4 10*3/uL (ref 1.4–6.5)
Neutrophils Relative %: 69 %
PLATELETS: 235 10*3/uL (ref 150–440)
RBC: 4.09 MIL/uL (ref 3.80–5.20)
RDW: 12.2 % (ref 11.5–14.5)
WBC: 7.8 10*3/uL (ref 3.6–11.0)

## 2015-12-11 LAB — MAGNESIUM: Magnesium: 1.7 mg/dL (ref 1.7–2.4)

## 2015-12-11 MED ORDER — LEVETIRACETAM 500 MG PO TABS: 500.0000 mg | ORAL_TABLET | Freq: Two times a day (BID) | ORAL | Status: DC

## 2015-12-12 ENCOUNTER — Ambulatory Visit
Admission: RE | Admit: 2015-12-12 | Discharge: 2015-12-12 | Disposition: A | Discharge status: Home / Self Care | Payer: Medicare Other | Source: Ambulatory Visit | Attending: Oncology | Admitting: Oncology

## 2015-12-12 ENCOUNTER — Encounter: Payer: Self-pay | Admitting: Oncology

## 2015-12-12 DIAGNOSIS — R9089 Other abnormal findings on diagnostic imaging of central nervous system: Secondary | ICD-10-CM | POA: Diagnosis not present

## 2015-12-12 DIAGNOSIS — C719 Malignant neoplasm of brain, unspecified: Secondary | ICD-10-CM | POA: Diagnosis not present

## 2015-12-12 DIAGNOSIS — Z9889 Other specified postprocedural states: Secondary | ICD-10-CM | POA: Diagnosis not present

## 2015-12-12 DIAGNOSIS — G9389 Other specified disorders of brain: Secondary | ICD-10-CM | POA: Diagnosis not present

## 2015-12-12 MED ORDER — GADOBENATE DIMEGLUMINE 529 MG/ML IV SOLN
15.0000 mL | Freq: Once | INTRAVENOUS | Status: AC | PRN
  Administered 2015-12-12: 14 mL via INTRAVENOUS
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
## 2015-12-13 ENCOUNTER — Ambulatory Visit
Admission: RE | Admit: 2015-12-13 | Discharge: 2015-12-13 | Disposition: A | Discharge status: Home / Self Care | Payer: Medicare Other | Source: Ambulatory Visit | Attending: Oncology | Admitting: Oncology

## 2015-12-13 DIAGNOSIS — Z9889 Other specified postprocedural states: Secondary | ICD-10-CM | POA: Diagnosis not present

## 2015-12-13 DIAGNOSIS — R600 Localized edema: Secondary | ICD-10-CM | POA: Diagnosis not present

## 2015-12-13 DIAGNOSIS — C719 Malignant neoplasm of brain, unspecified: Secondary | ICD-10-CM | POA: Diagnosis not present

## 2015-12-13 DIAGNOSIS — M79604 Pain in right leg: Secondary | ICD-10-CM | POA: Diagnosis not present

## 2015-12-13 DIAGNOSIS — M7989 Other specified soft tissue disorders: Secondary | ICD-10-CM

## 2015-12-13 DIAGNOSIS — R9089 Other abnormal findings on diagnostic imaging of central nervous system: Secondary | ICD-10-CM | POA: Diagnosis not present

## 2015-12-18 ENCOUNTER — Inpatient Hospital Stay (HOSPITAL_BASED_OUTPATIENT_CLINIC_OR_DEPARTMENT_OTHER): Payer: Medicare Other | Admitting: Oncology

## 2015-12-18 VITALS — BP 130/93 | HR 101 | Temp 97.3°F | Resp 18 | Wt 154.1 lb

## 2015-12-18 DIAGNOSIS — R600 Localized edema: Secondary | ICD-10-CM

## 2015-12-18 DIAGNOSIS — C712 Malignant neoplasm of temporal lobe: Secondary | ICD-10-CM | POA: Diagnosis not present

## 2015-12-18 DIAGNOSIS — Z923 Personal history of irradiation: Secondary | ICD-10-CM | POA: Diagnosis not present

## 2015-12-18 DIAGNOSIS — C719 Malignant neoplasm of brain, unspecified: Secondary | ICD-10-CM

## 2015-12-18 DIAGNOSIS — Z79899 Other long term (current) drug therapy: Secondary | ICD-10-CM | POA: Diagnosis not present

## 2015-12-18 DIAGNOSIS — E119 Type 2 diabetes mellitus without complications: Secondary | ICD-10-CM | POA: Diagnosis not present

## 2015-12-18 DIAGNOSIS — I10 Essential (primary) hypertension: Secondary | ICD-10-CM | POA: Diagnosis not present

## 2015-12-18 MED ORDER — TEMOZOLOMIDE 250 MG PO CAPS: ORAL_CAPSULE | ORAL | Status: DC
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
## 2015-12-23 ENCOUNTER — Other Ambulatory Visit: Payer: Self-pay | Admitting: Family Medicine

## 2015-12-23 ENCOUNTER — Encounter: Payer: Self-pay | Admitting: Oncology

## 2015-12-23 DIAGNOSIS — M222X2 Patellofemoral disorders, left knee: Secondary | ICD-10-CM

## 2015-12-23 NOTE — Telephone Encounter (Signed)
Please review and advise, KW

## 2015-12-23 NOTE — Telephone Encounter (Signed)
Pt needs refill HYDROcodone-acetaminophen (NORCO/VICODIN) 5-325 MG tablet   Thanks teri

## 2015-12-23 NOTE — Telephone Encounter (Signed)
Please ask her what she is using the pain medication for and how often. Originally rx'ed in April for left knee pain.

## 2015-12-24 ENCOUNTER — Other Ambulatory Visit: Payer: Self-pay | Admitting: Family Medicine

## 2015-12-24 DIAGNOSIS — M222X2 Patellofemoral disorders, left knee: Secondary | ICD-10-CM

## 2015-12-24 MED ORDER — HYDROCODONE-ACETAMINOPHEN 5-325 MG PO TABS: 1.0000 | ORAL_TABLET | Freq: Four times a day (QID) | ORAL | Status: DC | PRN
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
## 2015-12-24 NOTE — Telephone Encounter (Signed)
Prescription upfront for pick up

## 2015-12-24 NOTE — Telephone Encounter (Signed)
Patient has been advised. KW 

## 2015-12-27 ENCOUNTER — Telehealth: Payer: Self-pay | Admitting: Family Medicine

## 2015-12-27 ENCOUNTER — Other Ambulatory Visit: Payer: Self-pay | Admitting: Family Medicine

## 2015-12-27 DIAGNOSIS — M222X2 Patellofemoral disorders, left knee: Secondary | ICD-10-CM

## 2015-12-27 MED ORDER — HYDROCODONE-ACETAMINOPHEN 5-325 MG PO TABS: 1.0000 | ORAL_TABLET | Freq: Four times a day (QID) | ORAL | Status: DC | PRN

## 2015-12-27 NOTE — Telephone Encounter (Signed)
Patient called checking on her RX. For HYDROcodone-acetaminophen (NORCO/VICODIN) 5-325 MG tablet 12/24/15 --  She states that she thought she had picked it up . I verified that she did .  She cannot find the Rx.  She called the pharmacy and they said she did not have it filled.  She has 8 pills left from her last Rx and wants to know if she can get another Rx for this month.  Her call back is [phone]  Thanks Con Memos.

## 2015-12-27 NOTE — Telephone Encounter (Signed)
Spoke with pharmacist on phone last prescription they had for this drug was written on 4/6.kw

## 2015-12-27 NOTE — Telephone Encounter (Signed)
Refill request. KW 

## 2015-12-27 NOTE — Telephone Encounter (Signed)
I have placed another prescription up front for pickup. Would suggest she get it filled when she gets it so she won't lose it.

## 2015-12-27 NOTE — Telephone Encounter (Signed)
Please confirm with pharmacy whether she filled this rx

## 2015-12-27 NOTE — Telephone Encounter (Signed)
Pt advised-aa 

## 2016-01-01 ENCOUNTER — Inpatient Hospital Stay: Payer: Medicare Other

## 2016-01-01 ENCOUNTER — Ambulatory Visit: Payer: Medicare Other | Admitting: Oncology

## 2016-01-01 ENCOUNTER — Other Ambulatory Visit: Payer: Medicare Other

## 2016-01-01 DIAGNOSIS — I10 Essential (primary) hypertension: Secondary | ICD-10-CM | POA: Diagnosis not present

## 2016-01-01 DIAGNOSIS — C719 Malignant neoplasm of brain, unspecified: Secondary | ICD-10-CM

## 2016-01-01 DIAGNOSIS — Z923 Personal history of irradiation: Secondary | ICD-10-CM | POA: Diagnosis not present

## 2016-01-01 DIAGNOSIS — Z79899 Other long term (current) drug therapy: Secondary | ICD-10-CM | POA: Diagnosis not present

## 2016-01-01 DIAGNOSIS — C712 Malignant neoplasm of temporal lobe: Secondary | ICD-10-CM | POA: Diagnosis not present

## 2016-01-01 DIAGNOSIS — E119 Type 2 diabetes mellitus without complications: Secondary | ICD-10-CM | POA: Diagnosis not present

## 2016-01-01 DIAGNOSIS — R600 Localized edema: Secondary | ICD-10-CM | POA: Diagnosis not present

## 2016-01-01 LAB — CBC WITH DIFFERENTIAL/PLATELET
BASOS ABS: 0.1 10*3/uL (ref 0–0.1)
BASOS PCT: 1 %
EOS PCT: 2 %
Eosinophils Absolute: 0.1 10*3/uL (ref 0–0.7)
HEMATOCRIT: 36.5 % (ref 35.0–47.0)
Hemoglobin: 12.5 g/dL (ref 12.0–16.0)
Lymphocytes Relative: 26 %
Lymphs Abs: 1.6 10*3/uL (ref 1.0–3.6)
MCH: 30.3 pg (ref 26.0–34.0)
MCHC: 34.4 g/dL (ref 32.0–36.0)
MCV: 88 fL (ref 80.0–100.0)
MONO ABS: 0.5 10*3/uL (ref 0.2–0.9)
MONOS PCT: 8 %
NEUTROS ABS: 4 10*3/uL (ref 1.4–6.5)
Neutrophils Relative %: 63 %
PLATELETS: 195 10*3/uL (ref 150–440)
RBC: 4.14 MIL/uL (ref 3.80–5.20)
RDW: 12.1 % (ref 11.5–14.5)
WBC: 6.3 10*3/uL (ref 3.6–11.0)

## 2016-01-01 LAB — COMPREHENSIVE METABOLIC PANEL
ALBUMIN: 3.7 g/dL (ref 3.5–5.0)
ALT: 32 U/L (ref 14–54)
ANION GAP: 5 (ref 5–15)
AST: 23 U/L (ref 15–41)
Alkaline Phosphatase: 66 U/L (ref 38–126)
BILIRUBIN TOTAL: 0.5 mg/dL (ref 0.3–1.2)
BUN: 26 mg/dL — ABNORMAL HIGH (ref 6–20)
CHLORIDE: 105 mmol/L (ref 101–111)
CO2: 31 mmol/L (ref 22–32)
Calcium: 9.3 mg/dL (ref 8.9–10.3)
Creatinine, Ser: 1.76 mg/dL — ABNORMAL HIGH (ref 0.44–1.00)
GFR calc Af Amer: 33 mL/min — ABNORMAL LOW (ref >60)
GFR, EST NON AFRICAN AMERICAN: 28 mL/min — AB (ref >60)
GLUCOSE: 99 mg/dL (ref 65–99)
POTASSIUM: 3.7 mmol/L (ref 3.5–5.1)
Sodium: 141 mmol/L (ref 135–145)
TOTAL PROTEIN: 6.3 g/dL — AB (ref 6.5–8.1)

## 2016-01-10 ENCOUNTER — Other Ambulatory Visit: Payer: Self-pay | Admitting: Family Medicine

## 2016-01-14 ENCOUNTER — Inpatient Hospital Stay: Payer: Medicare Other | Attending: Oncology

## 2016-01-14 ENCOUNTER — Encounter: Payer: Self-pay | Admitting: Oncology

## 2016-01-14 ENCOUNTER — Inpatient Hospital Stay (HOSPITAL_BASED_OUTPATIENT_CLINIC_OR_DEPARTMENT_OTHER): Payer: Medicare Other | Admitting: Oncology

## 2016-01-14 VITALS — BP 128/78 | HR 83 | Temp 97.1°F | Resp 18 | Wt 149.4 lb

## 2016-01-14 DIAGNOSIS — Z923 Personal history of irradiation: Secondary | ICD-10-CM | POA: Diagnosis not present

## 2016-01-14 DIAGNOSIS — I10 Essential (primary) hypertension: Secondary | ICD-10-CM | POA: Insufficient documentation

## 2016-01-14 DIAGNOSIS — C712 Malignant neoplasm of temporal lobe: Secondary | ICD-10-CM | POA: Diagnosis not present

## 2016-01-14 DIAGNOSIS — Z79899 Other long term (current) drug therapy: Secondary | ICD-10-CM | POA: Diagnosis not present

## 2016-01-14 DIAGNOSIS — E119 Type 2 diabetes mellitus without complications: Secondary | ICD-10-CM | POA: Diagnosis not present

## 2016-01-14 DIAGNOSIS — E785 Hyperlipidemia, unspecified: Secondary | ICD-10-CM | POA: Diagnosis not present

## 2016-01-14 DIAGNOSIS — Z87891 Personal history of nicotine dependence: Secondary | ICD-10-CM | POA: Insufficient documentation

## 2016-01-14 DIAGNOSIS — C719 Malignant neoplasm of brain, unspecified: Secondary | ICD-10-CM

## 2016-01-14 LAB — CBC WITH DIFFERENTIAL/PLATELET
Basophils Absolute: 0.1 10*3/uL (ref 0–0.1)
Basophils Relative: 1 %
EOS PCT: 3 %
Eosinophils Absolute: 0.2 10*3/uL (ref 0–0.7)
HCT: 37.1 % (ref 35.0–47.0)
Hemoglobin: 13.1 g/dL (ref 12.0–16.0)
LYMPHS ABS: 1.7 10*3/uL (ref 1.0–3.6)
LYMPHS PCT: 25 %
MCH: 30.4 pg (ref 26.0–34.0)
MCHC: 35.2 g/dL (ref 32.0–36.0)
MCV: 86.3 fL (ref 80.0–100.0)
Monocytes Absolute: 0.6 10*3/uL (ref 0.2–0.9)
Monocytes Relative: 9 %
NEUTROS ABS: 4.3 10*3/uL (ref 1.4–6.5)
Neutrophils Relative %: 62 %
PLATELETS: 201 10*3/uL (ref 150–440)
RBC: 4.3 MIL/uL (ref 3.80–5.20)
RDW: 12.3 % (ref 11.5–14.5)
WBC: 6.8 10*3/uL (ref 3.6–11.0)

## 2016-01-14 LAB — COMPREHENSIVE METABOLIC PANEL
ALK PHOS: 64 U/L (ref 38–126)
ALT: 31 U/L (ref 14–54)
AST: 20 U/L (ref 15–41)
Albumin: 4 g/dL (ref 3.5–5.0)
Anion gap: 7 (ref 5–15)
BILIRUBIN TOTAL: 0.7 mg/dL (ref 0.3–1.2)
BUN: 32 mg/dL — AB (ref 6–20)
CALCIUM: 9.4 mg/dL (ref 8.9–10.3)
CHLORIDE: 111 mmol/L (ref 101–111)
CO2: 25 mmol/L (ref 22–32)
CREATININE: 0.91 mg/dL (ref 0.44–1.00)
Glucose, Bld: 106 mg/dL — ABNORMAL HIGH (ref 65–99)
Potassium: 3.7 mmol/L (ref 3.5–5.1)
Sodium: 143 mmol/L (ref 135–145)
TOTAL PROTEIN: 6.7 g/dL (ref 6.5–8.1)

## 2016-01-14 MED ORDER — TEMOZOLOMIDE 250 MG PO CAPS: ORAL_CAPSULE | ORAL | Status: DC
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
## 2016-01-15 ENCOUNTER — Ambulatory Visit: Payer: Medicare Other | Admitting: Oncology

## 2016-01-15 ENCOUNTER — Other Ambulatory Visit: Payer: Medicare Other

## 2016-01-27 ENCOUNTER — Telehealth: Payer: Self-pay | Admitting: *Deleted

## 2016-01-27 NOTE — Telephone Encounter (Signed)
Received her chemo pills and is confused about when to start them, and also when she should come in for appt with MD. She thought she was to take her 5 days of chemo pills at the end of the month and she has an appt on 6/21, but she is thinking it should be the following week to see how she is doing after starting the chemo. Per Dr Metro Kung last note, she is not to start taking the med until evaluated by md. Pt instructed NOT to take med until instructed by MD to do so. She repeated this back to me

## 2016-01-28 ENCOUNTER — Other Ambulatory Visit: Payer: Self-pay | Admitting: *Deleted

## 2016-01-28 DIAGNOSIS — C719 Malignant neoplasm of brain, unspecified: Secondary | ICD-10-CM

## 2016-01-29 ENCOUNTER — Inpatient Hospital Stay: Payer: Medicare Other

## 2016-01-29 ENCOUNTER — Inpatient Hospital Stay (HOSPITAL_BASED_OUTPATIENT_CLINIC_OR_DEPARTMENT_OTHER): Payer: Medicare Other | Admitting: Internal Medicine

## 2016-01-29 VITALS — BP 138/90 | HR 85 | Temp 98.2°F | Resp 18 | Wt 153.8 lb

## 2016-01-29 DIAGNOSIS — C712 Malignant neoplasm of temporal lobe: Secondary | ICD-10-CM

## 2016-01-29 DIAGNOSIS — E785 Hyperlipidemia, unspecified: Secondary | ICD-10-CM | POA: Diagnosis not present

## 2016-01-29 DIAGNOSIS — Z79899 Other long term (current) drug therapy: Secondary | ICD-10-CM

## 2016-01-29 DIAGNOSIS — C719 Malignant neoplasm of brain, unspecified: Secondary | ICD-10-CM

## 2016-01-29 DIAGNOSIS — I10 Essential (primary) hypertension: Secondary | ICD-10-CM | POA: Diagnosis not present

## 2016-01-29 DIAGNOSIS — E119 Type 2 diabetes mellitus without complications: Secondary | ICD-10-CM | POA: Diagnosis not present

## 2016-01-29 DIAGNOSIS — Z923 Personal history of irradiation: Secondary | ICD-10-CM | POA: Diagnosis not present

## 2016-01-29 LAB — CBC WITH DIFFERENTIAL/PLATELET
Basophils Absolute: 0.1 10*3/uL (ref 0–0.1)
Basophils Relative: 1 %
EOS PCT: 3 %
Eosinophils Absolute: 0.2 10*3/uL (ref 0–0.7)
HEMATOCRIT: 40.2 % (ref 35.0–47.0)
Hemoglobin: 14.1 g/dL (ref 12.0–16.0)
LYMPHS ABS: 1.5 10*3/uL (ref 1.0–3.6)
LYMPHS PCT: 20 %
MCH: 30.1 pg (ref 26.0–34.0)
MCHC: 35 g/dL (ref 32.0–36.0)
MCV: 86.1 fL (ref 80.0–100.0)
MONO ABS: 0.5 10*3/uL (ref 0.2–0.9)
Monocytes Relative: 6 %
NEUTROS ABS: 5.2 10*3/uL (ref 1.4–6.5)
Neutrophils Relative %: 70 %
PLATELETS: 189 10*3/uL (ref 150–440)
RBC: 4.67 MIL/uL (ref 3.80–5.20)
RDW: 12.5 % (ref 11.5–14.5)
WBC: 7.5 10*3/uL (ref 3.6–11.0)

## 2016-01-29 LAB — COMPREHENSIVE METABOLIC PANEL
ALT: 28 U/L (ref 14–54)
AST: 18 U/L (ref 15–41)
Albumin: 4.1 g/dL (ref 3.5–5.0)
Alkaline Phosphatase: 76 U/L (ref 38–126)
Anion gap: 4 — ABNORMAL LOW (ref 5–15)
BILIRUBIN TOTAL: 0.7 mg/dL (ref 0.3–1.2)
BUN: 29 mg/dL — AB (ref 6–20)
CHLORIDE: 110 mmol/L (ref 101–111)
CO2: 27 mmol/L (ref 22–32)
CREATININE: 0.85 mg/dL (ref 0.44–1.00)
Calcium: 9.8 mg/dL (ref 8.9–10.3)
Glucose, Bld: 117 mg/dL — ABNORMAL HIGH (ref 65–99)
POTASSIUM: 3.5 mmol/L (ref 3.5–5.1)
Sodium: 141 mmol/L (ref 135–145)
TOTAL PROTEIN: 6.6 g/dL (ref 6.5–8.1)

## 2016-01-29 NOTE — Progress Notes (Signed)
[place] OFFICE PROGRESS NOTE  Patient Care Team: Carmon Ginsberg, [place] as PCP - General (Family Medicine)  No matching staging information was found for the patient.   Oncology History   1. 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 (January, 2017) 2 starting radiation therapy and Temodar January of 2017 3.She has finished radiation therapy in April of 2017 and also finished Temodar for approximately 6 weeks 4.Patient started on maintenance Temodar from Jan 03, 2016     Glioblastoma determined by biopsy of brain ([place])   08/27/2015 Initial Diagnosis Glioblastoma determined by biopsy of brain ([place])      INTERVAL HISTORY:  Martha [name] 70 y.o.  female pleasant patient above history of GBM right frontal status post resection and currently on adjuvant Temodar cycle #1 is here for follow-up.  Patient admits to mild nausea after taking Temodar. This improves after taking nausea medications. No vomiting. Denies any unusual chest pain or shortness of breath or cough.  Denies any new-onset of weakness. She walks with a cane. In general appetite is good. No weight loss.  REVIEW OF SYSTEMS:  A complete 10 point review of system is done which is negative except mentioned above/history of present illness.   PAST MEDICAL HISTORY :  Past Medical History  Diagnosis Date  . Hypertension   . Diabetes mellitus without complication ([place])   . Hyperlipidemia   . Depression   . Varicose vein   . Epilepsy ([place])   . Thyroid disease     h/o thyroid ca- 30 years ago  . Glioblastoma determined by biopsy of brain ([place])   . Cancer [facility])     thyroid cancer treated with radioactive iodine    PAST SURGICAL HISTORY :   Past Surgical History  Procedure Laterality Date  . Cholecystectomy  04/24/2008    status post laparoscopic Dr.Ely  . Cervical spine surgery  05/1997    spinal fusion  . Bilateral carpal tunnel release   1990,1991    left hand in 1990 right hand in '91  . Toe surgery Left 1989  . Thyroidectomy, partial  1964    due to cancerous tumor  . Craniotomy Right 08/02/2015    Procedure: CRANIOTOMY TUMOR EXCISION;  Surgeon: Consuella Lose, MD;  Location: [place] NEURO ORS;  Service: Neurosurgery;  Laterality: Right;  CRANIOTOMY TUMOR EXCISION  . Application of cranial navigation N/A 08/02/2015    Procedure: APPLICATION OF CRANIAL NAVIGATION;  Surgeon: Consuella Lose, MD;  Location: [place] NEURO ORS;  Service: Neurosurgery;  Laterality: N/A;  APPLICATION OF CRANIAL NAVIGATION  . Radioactive iodine      FAMILY HISTORY :   Family History  Problem Relation Age of Onset  . Arthritis Mother   . Hyperlipidemia Mother   . Hypertension Mother   . Heart disease Mother   . Diabetes Mother     type 2  . Thyroid disease Mother   . Cancer Father     colon  . Alcohol abuse Brother   . Hyperlipidemia Brother   . Hypertension Brother   . Breast cancer Neg Hx   . Ovarian cancer Neg Hx     SOCIAL HISTORY:   Social History  Substance Use Topics  . Smoking status: Former Research scientist (life sciences)  . Smokeless tobacco: Never Used     Comment: quit smoking in 2007  . Alcohol Use: No    ALLERGIES:  is allergic to ampicillin; fish-derived products; tomato; and penicillins.  MEDICATIONS:  Current  Outpatient Prescriptions  Medication Sig Dispense Refill  . colchicine (COLCRYS) 0.6 MG tablet One pill ever 2-3 days for gout prevention 45 tablet 3  . dexamethasone (DECADRON) 4 MG tablet Take 4 mg by mouth 2 (two) times daily.    Marland Kitchen HYDROcodone-acetaminophen (NORCO/VICODIN) 5-325 MG tablet Take 1 tablet by mouth every 6 (six) hours as needed for moderate pain. 60 tablet 0  . levETIRAcetam (KEPPRA) 500 MG tablet Take 1 tablet (500 mg total) by mouth 2 (two) times daily. Reported on 12/11/2015    . levothyroxine (SYNTHROID, LEVOTHROID) 137 MCG tablet TAKE 1 TABLET BY MOUTH EVERY DAY 90 tablet 3  . pantoprazole (PROTONIX) 20 MG tablet  Take 1 tablet (20 mg total) by mouth daily. 30 tablet 0  . pravastatin (PRAVACHOL) 40 MG tablet Take 40 mg by mouth daily.  1  . promethazine (PHENERGAN) 25 MG tablet Take 1 tablet (25 mg total) by mouth every 6 (six) hours as needed for nausea or vomiting. 30 tablet 0  . temozolomide (TEMODAR) 250 MG capsule Take 1 capsule by mouth for 5 days, repeat every 4 weeks. 5 capsule 3   No current facility-administered medications for this visit.    PHYSICAL EXAMINATION: ECOG PERFORMANCE STATUS: 2 - Symptomatic, <50% confined to bed  BP 138/90 mmHg  Pulse 85  Temp(Src) 98.2 F (36.8 C) (Tympanic)  Resp 18  Wt 153 lb 12.3 oz (69.75 kg)  Filed Weights   01/29/16 1449  Weight: 153 lb 12.3 oz (69.75 kg)    GENERAL: Well-nourished well-developed; Alert, no distress and comfortable.Walks with a cane. She is accompanied by her friend.  EYES: no pallor or icterus OROPHARYNX: no thrush or ulceration; good dentition  NECK: supple, no masses felt LYMPH:  no palpable lymphadenopathy in the cervical, axillary or inguinal regions LUNGS: clear to auscultation and  No wheeze or crackles HEART/CVS: regular rate & rhythm and no murmurs; No lower extremity edema ABDOMEN:abdomen soft, non-tender and normal bowel sounds Musculoskeletal:no cyanosis of digits and no clubbing  PSYCH: alert & oriented x 3 with fluent speech NEURO: no focal motor/sensory deficits SKIN:  no rashes or significant lesions  LABORATORY DATA:  I have reviewed the data as listed    Component Value Date/Time   NA 141 01/29/2016 1425   NA 148* 11/14/2015 1623   NA 142 11/14/2014 1506   K 3.5 01/29/2016 1425   K 3.8 11/14/2014 1506   CL 110 01/29/2016 1425   CL 110 11/14/2014 1506   CO2 27 01/29/2016 1425   CO2 23 11/14/2014 1506   GLUCOSE 117* 01/29/2016 1425   GLUCOSE 93 11/14/2015 1623   GLUCOSE 104* 11/14/2014 1506   BUN 29* 01/29/2016 1425   BUN 13 11/14/2015 1623   BUN 31* 11/14/2014 1506   CREATININE 0.85  01/29/2016 1425   CREATININE 1.08* 11/14/2014 1506   CALCIUM 9.8 01/29/2016 1425   CALCIUM 9.5 11/14/2014 1506   PROT 6.6 01/29/2016 1425   ALBUMIN 4.1 01/29/2016 1425   ALBUMIN 3.9 11/14/2015 1623   AST 18 01/29/2016 1425   ALT 28 01/29/2016 1425   ALKPHOS 76 01/29/2016 1425   BILITOT 0.7 01/29/2016 1425   GFRNONAA >60 01/29/2016 1425   GFRNONAA 53* 11/14/2014 1506   GFRAA >60 01/29/2016 1425   GFRAA >60 11/14/2014 1506    No results found for: SPEP, UPEP  Lab Results  Component Value Date   WBC 7.5 01/29/2016   NEUTROABS 5.2 01/29/2016   HGB 14.1 01/29/2016   HCT 40.2  01/29/2016   MCV 86.1 01/29/2016   PLT 189 01/29/2016      Chemistry      Component Value Date/Time   NA 141 01/29/2016 1425   NA 148* 11/14/2015 1623   NA 142 11/14/2014 1506   K 3.5 01/29/2016 1425   K 3.8 11/14/2014 1506   CL 110 01/29/2016 1425   CL 110 11/14/2014 1506   CO2 27 01/29/2016 1425   CO2 23 11/14/2014 1506   BUN 29* 01/29/2016 1425   BUN 13 11/14/2015 1623   BUN 31* 11/14/2014 1506   CREATININE 0.85 01/29/2016 1425   CREATININE 1.08* 11/14/2014 1506      Component Value Date/Time   CALCIUM 9.8 01/29/2016 1425   CALCIUM 9.5 11/14/2014 1506   ALKPHOS 76 01/29/2016 1425   AST 18 01/29/2016 1425   ALT 28 01/29/2016 1425   BILITOT 0.7 01/29/2016 1425       RADIOGRAPHIC STUDIES: I have personally reviewed the radiological images as listed and agreed with the findings in the report. No results found.   ASSESSMENT & PLAN:  Glioblastoma determined by biopsy of brain ([place]) Status post resection followed by concurrent chemoradiation- status post cycle #1 of adjuvant therapy . Patient will start adjuvant therapy cycle number 2 on June 26th for 5 days.  Reviewed the MRI- done post chemoradiation showed contralateral left-sided tiny lesions concerning for contralateral metastasis. I reviewed the images myself and reviewed with the patient from. Recommend MRI of the brain prior to  next visit/ in approximately 3 weeks  # Follow-up with me in 4 weeks with CBC CMP.     Orders Placed This Encounter  Procedures  . MR Brain W Wo Contrast    Standing Status: Future     Number of Occurrences:      Standing Expiration Date: 03/30/2017    Order Specific Question:  Reason for Exam (SYMPTOM  OR DIAGNOSIS REQUIRED)    Answer:  GBM follow up    Order Specific Question:  Preferred imaging location?    Answer:  [facility] (table limit-300lbs)    Order Specific Question:  Does the patient have a pacemaker or implanted devices?    Answer:  No    Order Specific Question:  What is the patient's sedation requirement?    Answer:  No Sedation  . CBC with Differential    Standing Status: Future     Number of Occurrences:      Standing Expiration Date: 01/28/2017  . Comprehensive metabolic panel    Standing Status: Future     Number of Occurrences:      Standing Expiration Date: 01/28/2017    Order Specific Question:  Has the patient fasted?    Answer:  No   All questions were answered. The patient knows to call the clinic with any problems, questions or concerns.      Cammie Sickle, MD 01/29/2016 3:50 PM

## 2016-01-29 NOTE — Assessment & Plan Note (Addendum)
Status post resection followed by concurrent chemoradiation- status post cycle #1 of adjuvant therapy . Patient will start adjuvant therapy cycle number 2 on June 26th for 5 days.  Reviewed the MRI- done post chemoradiation showed contralateral left-sided tiny lesions concerning for contralateral metastasis. I reviewed the images myself and reviewed with the patient from. Recommend MRI of the brain prior to next visit/ in approximately 3 weeks  # Follow-up with me in 4 weeks with CBC CMP.

## 2016-02-18 ENCOUNTER — Encounter: Payer: Medicare Other | Admitting: Obstetrics and Gynecology

## 2016-02-25 ENCOUNTER — Encounter: Payer: Medicare Other | Admitting: Obstetrics and Gynecology

## 2016-02-28 ENCOUNTER — Inpatient Hospital Stay: Payer: Medicare Other | Attending: Internal Medicine

## 2016-02-28 ENCOUNTER — Ambulatory Visit
Admission: RE | Admit: 2016-02-28 | Discharge: 2016-02-28 | Disposition: A | Discharge status: Home / Self Care | Payer: Medicare Other | Source: Ambulatory Visit | Attending: Internal Medicine | Admitting: Internal Medicine

## 2016-02-28 DIAGNOSIS — I10 Essential (primary) hypertension: Secondary | ICD-10-CM | POA: Diagnosis not present

## 2016-02-28 DIAGNOSIS — C712 Malignant neoplasm of temporal lobe: Secondary | ICD-10-CM | POA: Diagnosis not present

## 2016-02-28 DIAGNOSIS — Z87891 Personal history of nicotine dependence: Secondary | ICD-10-CM | POA: Diagnosis not present

## 2016-02-28 DIAGNOSIS — Z9889 Other specified postprocedural states: Secondary | ICD-10-CM | POA: Diagnosis not present

## 2016-02-28 DIAGNOSIS — C719 Malignant neoplasm of brain, unspecified: Secondary | ICD-10-CM | POA: Insufficient documentation

## 2016-02-28 DIAGNOSIS — E119 Type 2 diabetes mellitus without complications: Secondary | ICD-10-CM | POA: Diagnosis not present

## 2016-02-28 DIAGNOSIS — Z923 Personal history of irradiation: Secondary | ICD-10-CM | POA: Diagnosis not present

## 2016-02-28 DIAGNOSIS — R9089 Other abnormal findings on diagnostic imaging of central nervous system: Secondary | ICD-10-CM | POA: Diagnosis not present

## 2016-02-28 DIAGNOSIS — F329 Major depressive disorder, single episode, unspecified: Secondary | ICD-10-CM | POA: Diagnosis not present

## 2016-02-28 DIAGNOSIS — Z9221 Personal history of antineoplastic chemotherapy: Secondary | ICD-10-CM | POA: Diagnosis not present

## 2016-02-28 DIAGNOSIS — Z85850 Personal history of malignant neoplasm of thyroid: Secondary | ICD-10-CM | POA: Diagnosis not present

## 2016-02-28 DIAGNOSIS — Z79899 Other long term (current) drug therapy: Secondary | ICD-10-CM | POA: Diagnosis not present

## 2016-02-28 DIAGNOSIS — E785 Hyperlipidemia, unspecified: Secondary | ICD-10-CM | POA: Diagnosis not present

## 2016-02-28 DIAGNOSIS — G40909 Epilepsy, unspecified, not intractable, without status epilepticus: Secondary | ICD-10-CM | POA: Insufficient documentation

## 2016-02-28 LAB — CBC WITH DIFFERENTIAL/PLATELET
BASOS PCT: 1 %
Basophils Absolute: 0.1 10*3/uL (ref 0–0.1)
EOS ABS: 0.1 10*3/uL (ref 0–0.7)
EOS PCT: 2 %
HCT: 39.5 % (ref 35.0–47.0)
HEMOGLOBIN: 13.7 g/dL (ref 12.0–16.0)
LYMPHS ABS: 1.7 10*3/uL (ref 1.0–3.6)
Lymphocytes Relative: 19 %
MCH: 29.8 pg (ref 26.0–34.0)
MCHC: 34.8 g/dL (ref 32.0–36.0)
MCV: 85.7 fL (ref 80.0–100.0)
MONOS PCT: 6 %
Monocytes Absolute: 0.5 10*3/uL (ref 0.2–0.9)
NEUTROS PCT: 72 %
Neutro Abs: 6.2 10*3/uL (ref 1.4–6.5)
PLATELETS: 197 10*3/uL (ref 150–440)
RBC: 4.61 MIL/uL (ref 3.80–5.20)
RDW: 13.1 % (ref 11.5–14.5)
WBC: 8.7 10*3/uL (ref 3.6–11.0)

## 2016-02-28 LAB — COMPREHENSIVE METABOLIC PANEL
ALBUMIN: 4.1 g/dL (ref 3.5–5.0)
ALK PHOS: 81 U/L (ref 38–126)
ALT: 29 U/L (ref 14–54)
ANION GAP: 5 (ref 5–15)
AST: 20 U/L (ref 15–41)
BUN: 27 mg/dL — ABNORMAL HIGH (ref 6–20)
CHLORIDE: 110 mmol/L (ref 101–111)
CO2: 27 mmol/L (ref 22–32)
Calcium: 9.4 mg/dL (ref 8.9–10.3)
Creatinine, Ser: 0.78 mg/dL (ref 0.44–1.00)
GFR calc non Af Amer: >60 mL/min (ref >60)
GLUCOSE: 115 mg/dL — AB (ref 65–99)
POTASSIUM: 3.8 mmol/L (ref 3.5–5.1)
SODIUM: 142 mmol/L (ref 135–145)
Total Bilirubin: 0.9 mg/dL (ref 0.3–1.2)
Total Protein: 6.6 g/dL (ref 6.5–8.1)

## 2016-02-28 MED ORDER — GADOBENATE DIMEGLUMINE 529 MG/ML IV SOLN
15.0000 mL | Freq: Once | INTRAVENOUS | Status: AC | PRN
  Administered 2016-02-28: 14 mL via INTRAVENOUS

## 2016-03-02 ENCOUNTER — Telehealth: Payer: Self-pay | Admitting: *Deleted

## 2016-03-02 NOTE — Telephone Encounter (Signed)
Per Dr B, OK to restart Temodar. Patient informed and repeated back to me

## 2016-03-02 NOTE — Telephone Encounter (Signed)
States she hd had a scan done and it is time to start her monthly chemo, but usually hears from [place] regarding scan results before she starts her chemo. Please review and advise if she is to start chemo.   02/28/16 IMPRESSION: 1. Right temporal lobe GBM resection with stable appearance of the resection cavity and no suspicious enhancement. However, there has been mild increased temporal lobe white matter T2 signal abnormality since May (see series 9, image 14) but this is not mass-like and I favor is post treatment related. Attention is directed on followup. 2. Interval stable appearance of on usual densely calcified and mildly enhancing lesions in the left hemisphere. 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 3. No new intracranial abnormality.

## 2016-03-04 ENCOUNTER — Other Ambulatory Visit: Payer: Self-pay | Admitting: Family Medicine

## 2016-03-04 DIAGNOSIS — M222X2 Patellofemoral disorders, left knee: Secondary | ICD-10-CM

## 2016-03-04 MED ORDER — HYDROCODONE-ACETAMINOPHEN 5-325 MG PO TABS: 1.0000 | ORAL_TABLET | Freq: Four times a day (QID) | ORAL | 0 refills | Status: DC | PRN

## 2016-03-04 NOTE — Telephone Encounter (Signed)
Patient has been advised that Rx is available for pick up. KW

## 2016-03-04 NOTE — Telephone Encounter (Signed)
Pt contacted office for refill request on the following medications:  HYDROCODON-ACETAMINOPHEN 5-325  [id number]

## 2016-03-04 NOTE — Telephone Encounter (Signed)
Medication up front for pickup  ?

## 2016-03-04 NOTE — Telephone Encounter (Signed)
Please review chart and advise. KW 

## 2016-03-06 ENCOUNTER — Other Ambulatory Visit: Payer: Medicare Other

## 2016-03-06 ENCOUNTER — Inpatient Hospital Stay (HOSPITAL_BASED_OUTPATIENT_CLINIC_OR_DEPARTMENT_OTHER): Payer: Medicare Other | Admitting: Internal Medicine

## 2016-03-06 VITALS — BP 140/89 | HR 92 | Temp 97.6°F | Resp 18 | Wt 153.4 lb

## 2016-03-06 DIAGNOSIS — Z9221 Personal history of antineoplastic chemotherapy: Secondary | ICD-10-CM

## 2016-03-06 DIAGNOSIS — C712 Malignant neoplasm of temporal lobe: Secondary | ICD-10-CM | POA: Diagnosis not present

## 2016-03-06 DIAGNOSIS — Z923 Personal history of irradiation: Secondary | ICD-10-CM

## 2016-03-06 DIAGNOSIS — C719 Malignant neoplasm of brain, unspecified: Secondary | ICD-10-CM

## 2016-03-06 DIAGNOSIS — I10 Essential (primary) hypertension: Secondary | ICD-10-CM | POA: Diagnosis not present

## 2016-03-06 DIAGNOSIS — Z85850 Personal history of malignant neoplasm of thyroid: Secondary | ICD-10-CM | POA: Diagnosis not present

## 2016-03-06 DIAGNOSIS — Z79899 Other long term (current) drug therapy: Secondary | ICD-10-CM

## 2016-03-06 NOTE — Assessment & Plan Note (Signed)
Status post resection followed by concurrent chemoradiation- status post cycle #2 of adjuvant therapy. MRI shows no evidence of recurrence in the resection cavity.  # Had a long discussion the patient and her friend regarding overall poor prognosis of GBM/incurable. In general the life expectancy is anywhere between 12-18 months.   # Discussed regarding adjuvant Temodar every month 6 versus indefinite  # Multiple small calcified lesions noted on the left cerebral hemisphere-unclear etiology no mass effect is noted. Monitor for now.  # Follow-up with me in 4 weeks with CBC CMP.   # I reviewed the blood work- with the patient in detail; also reviewed the imaging independently [as summarized above]; and with the patient in detail.   # 25 minutes face-to-face with the patient discussing the above plan of care; more than 50% of time spent on prognosis/ natural history; counseling and coordination.

## 2016-03-06 NOTE — Progress Notes (Signed)
[place] OFFICE PROGRESS NOTE  Patient Care Team: Carmon Ginsberg, [place] as PCP - General (Family Medicine)  No matching staging information was found for the patient.   Oncology History   1. 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 (January, 2017; s/p resection Dr.Nundkumar; [place]) 2 starting radiation therapy and Temodar January of 2017 3.She has finished radiation therapy in April of 2017 and also finished Temodar for approximately 6 weeks 4.Patient started on maintenance Temodar from Jan 03, 2016; July 2017- MRI no recurrence; left hemisphere- whitish plaques ? Etiology [reviwed TB]   #Thyroid cancer s/p Thyroidectomy [s/p RAUI; 1967]     Glioblastoma determined by biopsy of brain ([place])   08/27/2015 Initial Diagnosis    Glioblastoma determined by biopsy of brain ([place])        INTERVAL HISTORY:  Martha [name] 70 y.o.  female pleasant patient above history of GBM right frontal status post resection and currently on adjuvant Temodar cycle #2 is here for follow-up.  Denies any new-onset of headache. Denies any seizures. No new weaknesses.  Patient admits to mild nausea after taking Temodar. This improves after taking nausea medications. No vomiting. Denies any unusual chest pain or shortness of breath or cough.  She walks with a cane. In general appetite is good. No weight loss.  REVIEW OF SYSTEMS:  A complete 10 point review of system is done which is negative except mentioned above/history of present illness.   PAST MEDICAL HISTORY :  Past Medical History:  Diagnosis Date  . Cancer [facility])    thyroid cancer treated with radioactive iodine  . Depression   . Diabetes mellitus without complication ([place])   . Epilepsy ([place])   . Glioblastoma determined by biopsy of brain ([place])   . Hyperlipidemia   . Hypertension   . Thyroid disease    h/o thyroid ca- 30 years ago  . Varicose vein     PAST SURGICAL  HISTORY :   Past Surgical History:  Procedure Laterality Date  . APPLICATION OF CRANIAL NAVIGATION N/A 08/02/2015   Procedure: APPLICATION OF CRANIAL NAVIGATION;  Surgeon: Consuella Lose, MD;  Location: [place] [facility];  Service: Neurosurgery;  Laterality: N/A;  APPLICATION OF CRANIAL NAVIGATION  . BILATERAL CARPAL TUNNEL RELEASE  1990,1991   left hand in 1990 right hand in '91  . CERVICAL SPINE SURGERY  05/1997   spinal fusion  . CHOLECYSTECTOMY  04/24/2008   status post laparoscopic Dr.Ely  . CRANIOTOMY Right 08/02/2015   Procedure: CRANIOTOMY TUMOR EXCISION;  Surgeon: Consuella Lose, MD;  Location: [place] [facility];  Service: Neurosurgery;  Laterality: Right;  CRANIOTOMY TUMOR EXCISION  . radioactive iodine    . THYROIDECTOMY, PARTIAL  1964   due to cancerous tumor  . TOE SURGERY Left 1989    FAMILY HISTORY :   Family History  Problem Relation Age of Onset  . Arthritis Mother   . Hyperlipidemia Mother   . Hypertension Mother   . Heart disease Mother   . Diabetes Mother     type 2  . Thyroid disease Mother   . Cancer Father     colon  . Alcohol abuse Brother   . Hyperlipidemia Brother   . Hypertension Brother   . Breast cancer Neg Hx   . Ovarian cancer Neg Hx     SOCIAL HISTORY:   Social History  Substance Use Topics  . Smoking status: Former Research scientist (life sciences)  . Smokeless tobacco: Never Used  Comment: quit smoking in 2007  . Alcohol use No    ALLERGIES:  is allergic to ampicillin; fish-derived products; tomato; and penicillins.  MEDICATIONS:  Current Outpatient Prescriptions  Medication Sig Dispense Refill  . colchicine (COLCRYS) 0.6 MG tablet One pill ever 2-3 days for gout prevention 45 tablet 3  . dexamethasone (DECADRON) 4 MG tablet Take 4 mg by mouth 2 (two) times daily.    Marland Kitchen HYDROcodone-acetaminophen (NORCO/VICODIN) 5-325 MG tablet Take 1 tablet by mouth every 6 (six) hours as needed for moderate pain. 60 tablet 0  . levETIRAcetam (KEPPRA) 500 MG tablet Take 1  tablet (500 mg total) by mouth 2 (two) times daily. Reported on 12/11/2015    . levothyroxine (SYNTHROID, LEVOTHROID) 137 MCG tablet TAKE 1 TABLET BY MOUTH EVERY DAY 90 tablet 3  . pantoprazole (PROTONIX) 20 MG tablet Take 1 tablet (20 mg total) by mouth daily. 30 tablet 0  . pravastatin (PRAVACHOL) 40 MG tablet Take 40 mg by mouth daily.  1  . promethazine (PHENERGAN) 25 MG tablet Take 1 tablet (25 mg total) by mouth every 6 (six) hours as needed for nausea or vomiting. 30 tablet 0  . temozolomide (TEMODAR) 250 MG capsule Take 1 capsule by mouth for 5 days, repeat every 4 weeks. 5 capsule 3   No current facility-administered medications for this visit.     PHYSICAL EXAMINATION: ECOG PERFORMANCE STATUS: 2 - Symptomatic, <50% confined to bed  BP 140/89 (BP Location: Right Arm, Patient Position: Sitting)   Pulse 92   Temp 97.6 F (36.4 C) (Tympanic)   Resp 18   Wt 153 lb 6 oz (69.6 kg)   BMI 28.98 kg/m   Filed Weights   03/06/16 1426  Weight: 153 lb 6 oz (69.6 kg)    GENERAL: Well-nourished well-developed; Alert, no distress and comfortable.Walks with a cane. She is accompanied by her friend.  EYES: no pallor or icterus OROPHARYNX: no thrush or ulceration; good dentition  NECK: supple, no masses felt LYMPH:  no palpable lymphadenopathy in the cervical, axillary or inguinal regions LUNGS: clear to auscultation and  No wheeze or crackles HEART/CVS: regular rate & rhythm and no murmurs; No lower extremity edema ABDOMEN:abdomen soft, non-tender and normal bowel sounds Musculoskeletal:no cyanosis of digits and no clubbing  PSYCH: alert & oriented x 3 with fluent speech NEURO: no focal motor/sensory deficits SKIN:  no rashes or significant lesions  LABORATORY DATA:  I have reviewed the data as listed    Component Value Date/Time   NA 142 02/28/2016 1400   NA 148 (H) 11/14/2015 1623   NA 142 11/14/2014 1506   K 3.8 02/28/2016 1400   K 3.8 11/14/2014 1506   CL 110 02/28/2016 1400    CL 110 11/14/2014 1506   CO2 27 02/28/2016 1400   CO2 23 11/14/2014 1506   GLUCOSE 115 (H) 02/28/2016 1400   GLUCOSE 104 (H) 11/14/2014 1506   BUN 27 (H) 02/28/2016 1400   BUN 13 11/14/2015 1623   BUN 31 (H) 11/14/2014 1506   CREATININE 0.78 02/28/2016 1400   CREATININE 1.08 (H) 11/14/2014 1506   CALCIUM 9.4 02/28/2016 1400   CALCIUM 9.5 11/14/2014 1506   PROT 6.6 02/28/2016 1400   ALBUMIN 4.1 02/28/2016 1400   ALBUMIN 3.9 11/14/2015 1623   AST 20 02/28/2016 1400   ALT 29 02/28/2016 1400   ALKPHOS 81 02/28/2016 1400   BILITOT 0.9 02/28/2016 1400   GFRNONAA >60 02/28/2016 1400   GFRNONAA 53 (L) 11/14/2014 1506  GFRAA >60 02/28/2016 1400   GFRAA >60 11/14/2014 1506    No results found for: SPEP, UPEP  Lab Results  Component Value Date   WBC 8.7 02/28/2016   NEUTROABS 6.2 02/28/2016   HGB 13.7 02/28/2016   HCT 39.5 02/28/2016   MCV 85.7 02/28/2016   PLT 197 02/28/2016      Chemistry      Component Value Date/Time   NA 142 02/28/2016 1400   NA 148 (H) 11/14/2015 1623   NA 142 11/14/2014 1506   K 3.8 02/28/2016 1400   K 3.8 11/14/2014 1506   CL 110 02/28/2016 1400   CL 110 11/14/2014 1506   CO2 27 02/28/2016 1400   CO2 23 11/14/2014 1506   BUN 27 (H) 02/28/2016 1400   BUN 13 11/14/2015 1623   BUN 31 (H) 11/14/2014 1506   CREATININE 0.78 02/28/2016 1400   CREATININE 1.08 (H) 11/14/2014 1506      Component Value Date/Time   CALCIUM 9.4 02/28/2016 1400   CALCIUM 9.5 11/14/2014 1506   ALKPHOS 81 02/28/2016 1400   AST 20 02/28/2016 1400   ALT 29 02/28/2016 1400   BILITOT 0.9 02/28/2016 1400       RADIOGRAPHIC STUDIES: I have personally reviewed the radiological images as listed and agreed with the findings in the report. No results found.   ASSESSMENT & PLAN:  Glioblastoma determined by biopsy of brain ([place]) Status post resection followed by concurrent chemoradiation- status post cycle #2 of adjuvant therapy. MRI shows no evidence of recurrence in the  resection cavity.  # Had a long discussion the patient and her friend regarding overall poor prognosis of GBM/incurable. In general the life expectancy is anywhere between 12-18 months.   # Discussed regarding adjuvant Temodar every month 6 versus indefinite  # Multiple small calcified lesions noted on the left cerebral hemisphere-unclear etiology no mass effect is noted. Monitor for now.  # Follow-up with me in 4 weeks with CBC CMP.   # I reviewed the blood work- with the patient in detail; also reviewed the imaging independently [as summarized above]; and with the patient in detail.   # 25 minutes face-to-face with the patient discussing the above plan of care; more than 50% of time spent on prognosis/ natural history; counseling and coordination.    Orders Placed This Encounter  Procedures  . CBC with Differential    Standing Status:   Future    Standing Expiration Date:   03/06/2017  . Comprehensive metabolic panel    Standing Status:   Future    Standing Expiration Date:   03/06/2017   All questions were answered. The patient knows to call the clinic with any problems, questions or concerns.      Cammie Sickle, MD 03/06/2016 5:01 PM

## 2016-03-09 ENCOUNTER — Other Ambulatory Visit: Payer: Self-pay | Admitting: Family Medicine

## 2016-03-09 NOTE — Telephone Encounter (Signed)
Please review. KW 

## 2016-03-25 ENCOUNTER — Ambulatory Visit (INDEPENDENT_AMBULATORY_CARE_PROVIDER_SITE_OTHER): Payer: Medicare Other | Admitting: Obstetrics and Gynecology

## 2016-03-25 ENCOUNTER — Encounter: Payer: Self-pay | Admitting: Obstetrics and Gynecology

## 2016-03-25 VITALS — BP 157/97 | HR 82 | Ht 61.0 in | Wt 156.0 lb

## 2016-03-25 DIAGNOSIS — Z1239 Encounter for other screening for malignant neoplasm of breast: Secondary | ICD-10-CM | POA: Diagnosis not present

## 2016-03-25 DIAGNOSIS — Z01419 Encounter for gynecological examination (general) (routine) without abnormal findings: Secondary | ICD-10-CM | POA: Diagnosis not present

## 2016-03-25 DIAGNOSIS — Z794 Long term (current) use of insulin: Secondary | ICD-10-CM | POA: Diagnosis not present

## 2016-03-25 DIAGNOSIS — Z780 Asymptomatic menopausal state: Secondary | ICD-10-CM | POA: Diagnosis not present

## 2016-03-25 DIAGNOSIS — Z8639 Personal history of other endocrine, nutritional and metabolic disease: Secondary | ICD-10-CM

## 2016-03-25 DIAGNOSIS — Z13820 Encounter for screening for osteoporosis: Secondary | ICD-10-CM

## 2016-03-25 DIAGNOSIS — E1100 Type 2 diabetes mellitus with hyperosmolarity without nonketotic hyperglycemic-hyperosmolar coma (NKHHC): Secondary | ICD-10-CM

## 2016-03-25 DIAGNOSIS — Z131 Encounter for screening for diabetes mellitus: Secondary | ICD-10-CM | POA: Diagnosis not present

## 2016-03-25 DIAGNOSIS — Z13220 Encounter for screening for lipoid disorders: Secondary | ICD-10-CM

## 2016-03-25 DIAGNOSIS — Z124 Encounter for screening for malignant neoplasm of cervix: Secondary | ICD-10-CM | POA: Diagnosis not present

## 2016-03-25 NOTE — Telephone Encounter (Signed)
error 

## 2016-03-25 NOTE — Patient Instructions (Addendum)
Preventive Care for Adults, Female A healthy lifestyle and preventive care can promote health and wellness. Preventive health guidelines for women include the following key practices.  A routine yearly physical is a good way to check with your health care provider about your health and preventive screening. It is a chance to share any concerns and updates on your health and to receive a thorough exam.  Visit your dentist for a routine exam and preventive care every 6 months. Brush your teeth twice a day and floss once a day. Good oral hygiene prevents tooth decay and gum disease.  The frequency of eye exams is based on your age, health, family medical history, use of contact lenses, and other factors. Follow your health care provider's recommendations for frequency of eye exams.  Eat a healthy diet. Foods like vegetables, fruits, whole grains, low-fat dairy products, and lean protein foods contain the nutrients you need without too many calories. Decrease your intake of foods high in solid fats, added sugars, and salt. Eat the right amount of calories for you.Get information about a proper diet from your health care provider, if necessary.  Regular physical exercise is one of the most important things you can do for your health. Most adults should get at least 150 minutes of moderate-intensity exercise (any activity that increases your heart rate and causes you to sweat) each week. In addition, most adults need muscle-strengthening exercises on 2 or more days a week.  Maintain a healthy weight. The body mass index (BMI) is a screening tool to identify possible weight problems. It provides an estimate of body fat based on height and weight. Your health care provider can find your BMI and can help you achieve or maintain a healthy weight.For adults 20 years and older:  A BMI below 18.5 is considered underweight.  A BMI of 18.5 to 24.9 is normal.  A BMI of 25 to 29.9 is considered  overweight.  A BMI of 30 and above is considered obese.  Maintain normal blood lipids and cholesterol levels by exercising and minimizing your intake of saturated fat. Eat a balanced diet with plenty of fruit and vegetables. Blood tests for lipids and cholesterol should begin at age 64 and be repeated every 5 years. If your lipid or cholesterol levels are high, you are over 50, or you are at high risk for heart disease, you may need your cholesterol levels checked more frequently.Ongoing high lipid and cholesterol levels should be treated with medicines if diet and exercise are not working.  If you smoke, find out from your health care provider how to quit. If you do not use tobacco, do not start.  Lung cancer screening is recommended for adults aged 52-80 years who are at high risk for developing lung cancer because of a history of smoking. A yearly low-dose CT scan of the lungs is recommended for people who have at least a 30-pack-year history of smoking and are a current smoker or have quit within the past 15 years. A pack year of smoking is smoking an average of 1 pack of cigarettes a day for 1 year (for example: 1 pack a day for 30 years or 2 packs a day for 15 years). Yearly screening should continue until the smoker has stopped smoking for at least 15 years. Yearly screening should be stopped for people who develop a health problem that would prevent them from having lung cancer treatment.  If you are pregnant, do not drink alcohol. If you are  breastfeeding, be very cautious about drinking alcohol. If you are not pregnant and choose to drink alcohol, do not have more than 1 drink per day. One drink is considered to be 12 ounces (355 mL) of beer, 5 ounces (148 mL) of wine, or 1.5 ounces (44 mL) of liquor.  Avoid use of street drugs. Do not share needles with anyone. Ask for help if you need support or instructions about stopping the use of drugs.  High blood pressure causes heart disease and  increases the risk of stroke. Your blood pressure should be checked at least every 1 to 2 years. Ongoing high blood pressure should be treated with medicines if weight loss and exercise do not work.  If you are 25-78 years old, ask your health care provider if you should take aspirin to prevent strokes.  Diabetes screening is done by taking a blood sample to check your blood glucose level after you have not eaten for a certain period of time (fasting). If you are not overweight and you do not have risk factors for diabetes, you should be screened once every 3 years starting at age 86. If you are overweight or obese and you are 3-87 years of age, you should be screened for diabetes every year as part of your cardiovascular risk assessment.  Breast cancer screening is essential preventive care for women. You should practice "breast self-awareness." This means understanding the normal appearance and feel of your breasts and may include breast self-examination. Any changes detected, no matter how small, should be reported to a health care provider. Women in their 66s and 30s should have a clinical breast exam (CBE) by a health care provider as part of a regular health exam every 1 to 3 years. After age 43, women should have a CBE every year. Starting at age 37, women should consider having a mammogram (breast X-ray test) every year. Women who have a family history of breast cancer should talk to their health care provider about genetic screening. Women at a high risk of breast cancer should talk to their health care providers about having an MRI and a mammogram every year.  Breast cancer gene (BRCA)-related cancer risk assessment is recommended for women who have family members with BRCA-related cancers. BRCA-related cancers include breast, ovarian, tubal, and peritoneal cancers. Having family members with these cancers may be associated with an increased risk for harmful changes (mutations) in the breast  cancer genes BRCA1 and BRCA2. Results of the assessment will determine the need for genetic counseling and BRCA1 and BRCA2 testing.  Your health care provider may recommend that you be screened regularly for cancer of the pelvic organs (ovaries, uterus, and vagina). This screening involves a pelvic examination, including checking for microscopic changes to the surface of your cervix (Pap test). You may be encouraged to have this screening done every 3 years, beginning at age 78.  For women ages 79-65, health care providers may recommend pelvic exams and Pap testing every 3 years, or they may recommend the Pap and pelvic exam, combined with testing for human papilloma virus (HPV), every 5 years. Some types of HPV increase your risk of cervical cancer. Testing for HPV may also be done on women of any age with unclear Pap test results.  Other health care providers may not recommend any screening for nonpregnant women who are considered low risk for pelvic cancer and who do not have symptoms. Ask your health care provider if a screening pelvic exam is right for  you.  If you have had past treatment for cervical cancer or a condition that could lead to cancer, you need Pap tests and screening for cancer for at least 20 years after your treatment. If Pap tests have been discontinued, your risk factors (such as having a new sexual partner) need to be reassessed to determine if screening should resume. Some women have medical problems that increase the chance of getting cervical cancer. In these cases, your health care provider may recommend more frequent screening and Pap tests.  Colorectal cancer can be detected and often prevented. Most routine colorectal cancer screening begins at the age of 50 years and continues through age 75 years. However, your health care provider may recommend screening at an earlier age if you have risk factors for colon cancer. On a yearly basis, your health care provider may provide  home test kits to check for hidden blood in the stool. Use of a small camera at the end of a tube, to directly examine the colon (sigmoidoscopy or colonoscopy), can detect the earliest forms of colorectal cancer. Talk to your health care provider about this at age 50, when routine screening begins. Direct exam of the colon should be repeated every 5-10 years through age 75 years, unless early forms of precancerous polyps or small growths are found.  People who are at an increased risk for hepatitis B should be screened for this virus. You are considered at high risk for hepatitis B if:  You were born in a country where hepatitis B occurs often. Talk with your health care provider about which countries are considered high risk.  Your parents were born in a high-risk country and you have not received a shot to protect against hepatitis B (hepatitis B vaccine).  You have HIV or AIDS.  You use needles to inject street drugs.  You live with, or have sex with, someone who has hepatitis B.  You get hemodialysis treatment.  You take certain medicines for conditions like cancer, organ transplantation, and autoimmune conditions.  Hepatitis C blood testing is recommended for all people born from 1945 through 1965 and any individual with known risks for hepatitis C.  Practice safe sex. Use condoms and avoid high-risk sexual practices to reduce the spread of sexually transmitted infections (STIs). STIs include gonorrhea, chlamydia, syphilis, trichomonas, herpes, HPV, and human immunodeficiency virus (HIV). Herpes, HIV, and HPV are viral illnesses that have no cure. They can result in disability, cancer, and death.  You should be screened for sexually transmitted illnesses (STIs) including gonorrhea and chlamydia if:  You are sexually active and are younger than 24 years.  You are older than 24 years and your health care provider tells you that you are at risk for this type of infection.  Your sexual  activity has changed since you were last screened and you are at an increased risk for chlamydia or gonorrhea. Ask your health care provider if you are at risk.  If you are at risk of being infected with HIV, it is recommended that you take a prescription medicine daily to prevent HIV infection. This is called preexposure prophylaxis (PrEP). You are considered at risk if:  You are sexually active and do not regularly use condoms or know the HIV status of your partner(s).  You take drugs by injection.  You are sexually active with a partner who has HIV.  Talk with your health care provider about whether you are at high risk of being infected with HIV. If   you choose to begin PrEP, you should first be tested for HIV. You should then be tested every 3 months for as long as you are taking PrEP.  Osteoporosis is a disease in which the bones lose minerals and strength with aging. This can result in serious bone fractures or breaks. The risk of osteoporosis can be identified using a bone density scan. Women ages 1 years and over and women at risk for fractures or osteoporosis should discuss screening with their health care providers. Ask your health care provider whether you should take a calcium supplement or vitamin D to reduce the rate of osteoporosis.  Menopause can be associated with physical symptoms and risks. Hormone replacement therapy is available to decrease symptoms and risks. You should talk to your health care provider about whether hormone replacement therapy is right for you.  Use sunscreen. Apply sunscreen liberally and repeatedly throughout the day. You should seek shade when your shadow is shorter than you. Protect yourself by wearing long sleeves, pants, a wide-brimmed hat, and sunglasses year round, whenever you are outdoors.  Once a month, do a whole body skin exam, using a mirror to look at the skin on your back. Tell your health care provider of new moles, moles that have irregular  borders, moles that are larger than a pencil eraser, or moles that have changed in shape or color.  Stay current with required vaccines (immunizations).  Influenza vaccine. All adults should be immunized every year.  Tetanus, diphtheria, and acellular pertussis (Td, Tdap) vaccine. Pregnant women should receive 1 dose of Tdap vaccine during each pregnancy. The dose should be obtained regardless of the length of time since the last dose. Immunization is preferred during the 27th-36th week of gestation. An adult who has not previously received Tdap or who does not know her vaccine status should receive 1 dose of Tdap. This initial dose should be followed by tetanus and diphtheria toxoids (Td) booster doses every 10 years. Adults with an unknown or incomplete history of completing a 3-dose immunization series with Td-containing vaccines should begin or complete a primary immunization series including a Tdap dose. Adults should receive a Td booster every 10 years.  Varicella vaccine. An adult without evidence of immunity to varicella should receive 2 doses or a second dose if she has previously received 1 dose. Pregnant females who do not have evidence of immunity should receive the first dose after pregnancy. This first dose should be obtained before leaving the health care facility. The second dose should be obtained 4-8 weeks after the first dose.  Human papillomavirus (HPV) vaccine. Females aged 13-26 years who have not received the vaccine previously should obtain the 3-dose series. The vaccine is not recommended for use in pregnant females. However, pregnancy testing is not needed before receiving a dose. If a female is found to be pregnant after receiving a dose, no treatment is needed. In that case, the remaining doses should be delayed until after the pregnancy. Immunization is recommended for any person with an immunocompromised condition through the age of 24 years if she did not get any or all doses  earlier. During the 3-dose series, the second dose should be obtained 4-8 weeks after the first dose. The third dose should be obtained 24 weeks after the first dose and 16 weeks after the second dose.  Zoster vaccine. One dose is recommended for adults aged 97 years or older unless certain conditions are present.  Measles, mumps, and rubella (MMR) vaccine. Adults born  before 1957 generally are considered immune to measles and mumps. Adults born in [birth year] or later should have 1 or more doses of MMR vaccine unless there is a contraindication to the vaccine or there is laboratory evidence of immunity to each of the three diseases. A routine second dose of MMR vaccine should be obtained at least 28 days after the first dose for students attending postsecondary schools, health care workers, or international travelers. People who received inactivated measles vaccine or an unknown type of measles vaccine during 1963-1967 should receive 2 doses of MMR vaccine. People who received inactivated mumps vaccine or an unknown type of mumps vaccine before 1979 and are at high risk for mumps infection should consider immunization with 2 doses of MMR vaccine. For females of childbearing age, rubella immunity should be determined. If there is no evidence of immunity, females who are not pregnant should be vaccinated. If there is no evidence of immunity, females who are pregnant should delay immunization until after pregnancy. Unvaccinated health care workers born before 60 who lack laboratory evidence of measles, mumps, or rubella immunity or laboratory confirmation of disease should consider measles and mumps immunization with 2 doses of MMR vaccine or rubella immunization with 1 dose of MMR vaccine.  Pneumococcal 13-valent conjugate (PCV13) vaccine. When indicated, a person who is uncertain of his immunization history and has no record of immunization should receive the PCV13 vaccine. All adults 61 years of age and older  should receive this vaccine. An adult aged 92 years or older who has certain medical conditions and has not been previously immunized should receive 1 dose of PCV13 vaccine. This PCV13 should be followed with a dose of pneumococcal polysaccharide (PPSV23) vaccine. Adults who are at high risk for pneumococcal disease should obtain the PPSV23 vaccine at least 8 weeks after the dose of PCV13 vaccine. Adults older than 70 years of age who have normal immune system function should obtain the PPSV23 vaccine dose at least 1 year after the dose of PCV13 vaccine.  Pneumococcal polysaccharide (PPSV23) vaccine. When PCV13 is also indicated, PCV13 should be obtained first. All adults aged 2 years and older should be immunized. An adult younger than age 30 years who has certain medical conditions should be immunized. Any person who resides in a nursing home or long-term care facility should be immunized. An adult smoker should be immunized. People with an immunocompromised condition and certain other conditions should receive both PCV13 and PPSV23 vaccines. People with human immunodeficiency virus (HIV) infection should be immunized as soon as possible after diagnosis. Immunization during chemotherapy or radiation therapy should be avoided. Routine use of PPSV23 vaccine is not recommended for American Indians, [place] Natives, or people younger than 65 years unless there are medical conditions that require PPSV23 vaccine. When indicated, people who have unknown immunization and have no record of immunization should receive PPSV23 vaccine. One-time revaccination 5 years after the first dose of PPSV23 is recommended for people aged 19-64 years who have chronic kidney failure, nephrotic syndrome, asplenia, or immunocompromised conditions. People who received 1-2 doses of PPSV23 before age 44 years should receive another dose of PPSV23 vaccine at age 83 years or later if at least 5 years have passed since the previous dose. Doses  of PPSV23 are not needed for people immunized with PPSV23 at or after age 20 years.  Meningococcal vaccine. Adults with asplenia or persistent complement component deficiencies should receive 2 doses of quadrivalent meningococcal conjugate (MenACWY-D) vaccine. The doses should be obtained  at least 2 months apart. Microbiologists working with certain meningococcal bacteria, [place] recruits, people at risk during an outbreak, and people who travel to or live in countries with a high rate of meningitis should be immunized. A first-year college student up through age 28 years who is living in a residence hall should receive a dose if she did not receive a dose on or after her 16th birthday. Adults who have certain high-risk conditions should receive one or more doses of vaccine.  Hepatitis A vaccine. Adults who wish to be protected from this disease, have certain high-risk conditions, work with hepatitis A-infected animals, work in hepatitis A research labs, or travel to or work in countries with a high rate of hepatitis A should be immunized. Adults who were previously unvaccinated and who anticipate close contact with an international adoptee during the first 60 days after arrival in the [place] from a country with a high rate of hepatitis A should be immunized.  Hepatitis B vaccine. Adults who wish to be protected from this disease, have certain high-risk conditions, may be exposed to blood or other infectious body fluids, are household contacts or sex partners of hepatitis B positive people, are clients or workers in certain care facilities, or travel to or work in countries with a high rate of hepatitis B should be immunized.  Haemophilus influenzae type b (Hib) vaccine. A previously unvaccinated person with asplenia or sickle cell disease or having a scheduled splenectomy should receive 1 dose of Hib vaccine. Regardless of previous immunization, a recipient of a hematopoietic stem cell transplant  should receive a 3-dose series 6-12 months after her successful transplant. Hib vaccine is not recommended for adults with HIV infection. Preventive Services / Frequency Ages 71 to 87 years  Blood pressure check.** / Every 3-5 years.  Lipid and cholesterol check.** / Every 5 years beginning at age 1.  Clinical breast exam.** / Every 3 years for women in their 3s and 31s.  BRCA-related cancer risk assessment.** / For women who have family members with a BRCA-related cancer (breast, ovarian, tubal, or peritoneal cancers).  Pap test.** / Every 2 years from ages 50 through 86. Every 3 years starting at age 87 through age 7 or 75 with a history of 3 consecutive normal Pap tests.  HPV screening.** / Every 3 years from ages 59 through ages 35 to 6 with a history of 3 consecutive normal Pap tests.  Hepatitis C blood test.** / For any individual with known risks for hepatitis C.  Skin self-exam. / Monthly.  Influenza vaccine. / Every year.  Tetanus, diphtheria, and acellular pertussis (Tdap, Td) vaccine.** / Consult your health care provider. Pregnant women should receive 1 dose of Tdap vaccine during each pregnancy. 1 dose of Td every 10 years.  Varicella vaccine.** / Consult your health care provider. Pregnant females who do not have evidence of immunity should receive the first dose after pregnancy.  HPV vaccine. / 3 doses over 6 months, if 72 and younger. The vaccine is not recommended for use in pregnant females. However, pregnancy testing is not needed before receiving a dose.  Measles, mumps, rubella (MMR) vaccine.** / You need at least 1 dose of MMR if you were born in 1957 or later. You may also need a 2nd dose. For females of childbearing age, rubella immunity should be determined. If there is no evidence of immunity, females who are not pregnant should be vaccinated. If there is no evidence of immunity, females who are  pregnant should delay immunization until after  pregnancy.  Pneumococcal 13-valent conjugate (PCV13) vaccine.** / Consult your health care provider.  Pneumococcal polysaccharide (PPSV23) vaccine.** / 1 to 2 doses if you smoke cigarettes or if you have certain conditions.  Meningococcal vaccine.** / 1 dose if you are age 87 to 44 years and a Market researcher living in a residence hall, or have one of several medical conditions, you need to get vaccinated against meningococcal disease. You may also need additional booster doses.  Hepatitis A vaccine.** / Consult your health care provider.  Hepatitis B vaccine.** / Consult your health care provider.  Haemophilus influenzae type b (Hib) vaccine.** / Consult your health care provider. Ages 86 to 38 years  Blood pressure check.** / Every year.  Lipid and cholesterol check.** / Every 5 years beginning at age 49 years.  Lung cancer screening. / Every year if you are aged 71-80 years and have a 30-pack-year history of smoking and currently smoke or have quit within the past 15 years. Yearly screening is stopped once you have quit smoking for at least 15 years or develop a health problem that would prevent you from having lung cancer treatment.  Clinical breast exam.** / Every year after age 51 years.  BRCA-related cancer risk assessment.** / For women who have family members with a BRCA-related cancer (breast, ovarian, tubal, or peritoneal cancers).  Mammogram.** / Every year beginning at age 18 years and continuing for as long as you are in good health. Consult with your health care provider.  Pap test.** / Every 3 years starting at age 63 years through age 37 or 57 years with a history of 3 consecutive normal Pap tests.  HPV screening.** / Every 3 years from ages 41 years through ages 76 to 23 years with a history of 3 consecutive normal Pap tests.  Fecal occult blood test (FOBT) of stool. / Every year beginning at age 36 years and continuing until age 51 years. You may not need  to do this test if you get a colonoscopy every 10 years.  Flexible sigmoidoscopy or colonoscopy.** / Every 5 years for a flexible sigmoidoscopy or every 10 years for a colonoscopy beginning at age 36 years and continuing until age 35 years.  Hepatitis C blood test.** / For all people born from [birth year] through 1965 and any individual with known risks for hepatitis C.  Skin self-exam. / Monthly.  Influenza vaccine. / Every year.  Tetanus, diphtheria, and acellular pertussis (Tdap/Td) vaccine.** / Consult your health care provider. Pregnant women should receive 1 dose of Tdap vaccine during each pregnancy. 1 dose of Td every 10 years.  Varicella vaccine.** / Consult your health care provider. Pregnant females who do not have evidence of immunity should receive the first dose after pregnancy.  Zoster vaccine.** / 1 dose for adults aged 73 years or older.  Measles, mumps, rubella (MMR) vaccine.** / You need at least 1 dose of MMR if you were born in 1957 or later. You may also need a second dose. For females of childbearing age, rubella immunity should be determined. If there is no evidence of immunity, females who are not pregnant should be vaccinated. If there is no evidence of immunity, females who are pregnant should delay immunization until after pregnancy.  Pneumococcal 13-valent conjugate (PCV13) vaccine.** / Consult your health care provider.  Pneumococcal polysaccharide (PPSV23) vaccine.** / 1 to 2 doses if you smoke cigarettes or if you have certain conditions.  Meningococcal vaccine.** /  Consult your health care provider.  Hepatitis A vaccine.** / Consult your health care provider.  Hepatitis B vaccine.** / Consult your health care provider.  Haemophilus influenzae type b (Hib) vaccine.** / Consult your health care provider. Ages 80 years and over  Blood pressure check.** / Every year.  Lipid and cholesterol check.** / Every 5 years beginning at age 62 years.  Lung cancer  screening. / Every year if you are aged 32-80 years and have a 30-pack-year history of smoking and currently smoke or have quit within the past 15 years. Yearly screening is stopped once you have quit smoking for at least 15 years or develop a health problem that would prevent you from having lung cancer treatment.  Clinical breast exam.** / Every year after age 61 years.  BRCA-related cancer risk assessment.** / For women who have family members with a BRCA-related cancer (breast, ovarian, tubal, or peritoneal cancers).  Mammogram.** / Every year beginning at age 39 years and continuing for as long as you are in good health. Consult with your health care provider.  Pap test.** / Every 3 years starting at age 85 years through age 74 or 72 years with 3 consecutive normal Pap tests. Testing can be stopped between 65 and 70 years with 3 consecutive normal Pap tests and no abnormal Pap or HPV tests in the past 10 years.  HPV screening.** / Every 3 years from ages 55 years through ages 67 or 77 years with a history of 3 consecutive normal Pap tests. Testing can be stopped between 65 and 70 years with 3 consecutive normal Pap tests and no abnormal Pap or HPV tests in the past 10 years.  Fecal occult blood test (FOBT) of stool. / Every year beginning at age 81 years and continuing until age 22 years. You may not need to do this test if you get a colonoscopy every 10 years.  Flexible sigmoidoscopy or colonoscopy.** / Every 5 years for a flexible sigmoidoscopy or every 10 years for a colonoscopy beginning at age 67 years and continuing until age 22 years.  Hepatitis C blood test.** / For all people born from [birth year] through 1965 and any individual with known risks for hepatitis C.  Osteoporosis screening.** / A one-time screening for women ages 8 years and over and women at risk for fractures or osteoporosis.  Skin self-exam. / Monthly.  Influenza vaccine. / Every year.  Tetanus, diphtheria, and  acellular pertussis (Tdap/Td) vaccine.** / 1 dose of Td every 10 years.  Varicella vaccine.** / Consult your health care provider.  Zoster vaccine.** / 1 dose for adults aged 56 years or older.  Pneumococcal 13-valent conjugate (PCV13) vaccine.** / Consult your health care provider.  Pneumococcal polysaccharide (PPSV23) vaccine.** / 1 dose for all adults aged 15 years and older.  Meningococcal vaccine.** / Consult your health care provider.  Hepatitis A vaccine.** / Consult your health care provider.  Hepatitis B vaccine.** / Consult your health care provider.  Haemophilus influenzae type b (Hib) vaccine.** / Consult your health care provider. ** Family history and personal history of risk and conditions may change your health care provider's recommendations.   This information is not intended to replace advice given to you by your health care provider. Make sure you discuss any questions you have with your health care provider.   Document Released: 09/22/2001 Document Revised: 08/17/2014 Document Reviewed: 12/22/2010 Elsevier Interactive Patient Education [employer].

## 2016-03-25 NOTE — Progress Notes (Signed)
ANNUAL PREVENTATIVE CARE GYNECOLOGY  ENCOUNTER NOTE  Subjective:       Martha [name] is a 70 y.o. G0P0000 female here to establish care, and for a routine annual gynecologic exam.  Patient with remote h/o thyroid cancer s/p thyroidectomy, now with brain cancer with mets (currently undergoing chemotherapy monthly. The patient has never been sexually active. The patient has never been taking hormone replacement therapy. Patient denies post-menopausal vaginal bleeding. The patient wears seatbelts: yes. The patient participates in regular exercise: yes. Has the patient ever been transfused or tattooed?: no. The patient reports that there is not domestic violence in her life.  Current complaints: 1.  Denies complaints.     Gynecologic History No LMP recorded. Patient is postmenopausal. Contraception: post menopausal status Last Pap: 2015. Results were: normal Last mammogram: 2015. Results were: normal.  H/o remote abnormal mammogram.  Last Colonoscopy: 2015.  Results: diverticulitis and diverticulosis  Last Dexa Scan: Has never had one.    Obstetric History OB History  Gravida Para Term Preterm AB Living  0 0 0 0 0 0  SAB TAB Ectopic Multiple Live Births  0 0 0 0          Past Medical History:  Diagnosis Date  . Cancer [facility])    thyroid cancer treated with radioactive iodine  . Depression   . Diabetes mellitus without complication ([place])   . Epilepsy ([place])   . 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 Hyperlipidemia   . Hypertension   . Thyroid disease    h/o thyroid ca- 30 years ago  . Varicose vein     Family History  Problem Relation Age of Onset  . Arthritis Mother   . Hyperlipidemia Mother   . Hypertension Mother   . Heart disease Mother   . Diabetes Mother     type 2  . Thyroid disease Mother   . Cancer Father     colon  . Alcohol abuse Brother   . Hyperlipidemia Brother   . Hypertension Brother   . Breast cancer Neg Hx   . Ovarian cancer  Neg Hx     Past Surgical History:  Procedure Laterality Date  . APPLICATION OF CRANIAL NAVIGATION N/A 08/02/2015   Procedure: APPLICATION OF CRANIAL NAVIGATION;  Surgeon: Consuella Lose, MD;  Location: [place] NEURO ORS;  Service: Neurosurgery;  Laterality: N/A;  APPLICATION OF CRANIAL NAVIGATION  . BILATERAL CARPAL TUNNEL RELEASE  1990,1991   left hand in 1990 right hand in '91  . CERVICAL SPINE SURGERY  05/1997   spinal fusion  . CHOLECYSTECTOMY  04/24/2008   status post laparoscopic Dr.Ely  . CRANIOTOMY Right 08/02/2015   Procedure: CRANIOTOMY TUMOR EXCISION;  Surgeon: Consuella Lose, MD;  Location: [place] NEURO ORS;  Service: Neurosurgery;  Laterality: Right;  CRANIOTOMY TUMOR EXCISION  . radioactive iodine    . THYROIDECTOMY, PARTIAL  1964   due to cancerous tumor  . TOE SURGERY Left 1989    Social History   Social History  . Marital status: Single    Spouse name: N/A  . Number of children: N/A  . Years of education: N/A   Occupational History  . Not on file.   Social History Main Topics  . Smoking status: Former Research scientist (life sciences)  . Smokeless tobacco: Never Used     Comment: quit smoking in 2007  . Alcohol use No  . Drug use: No  . Sexual activity: No   Other Topics Concern  . Not  on file   Social History Narrative  . No narrative on file    Current Outpatient Prescriptions on File Prior to Visit  Medication Sig Dispense Refill  . colchicine (COLCRYS) 0.6 MG tablet One pill ever 2-3 days for gout prevention 45 tablet 3  . dexamethasone (DECADRON) 4 MG tablet Take 4 mg by mouth 2 (two) times daily.    Marland Kitchen levETIRAcetam (KEPPRA) 500 MG tablet Take 1 tablet (500 mg total) by mouth 2 (two) times daily. Reported on 12/11/2015    . levothyroxine (SYNTHROID, LEVOTHROID) 137 MCG tablet TAKE 1 TABLET BY MOUTH EVERY DAY 90 tablet 3  . pantoprazole (PROTONIX) 20 MG tablet Take 1 tablet (20 mg total) by mouth daily. 30 tablet 0  . pravastatin (PRAVACHOL) 40 MG tablet TAKE 1 TABLET BY  MOUTH EVERY DAY 90 tablet 1  . promethazine (PHENERGAN) 25 MG tablet Take 1 tablet (25 mg total) by mouth every 6 (six) hours as needed for nausea or vomiting. 30 tablet 0  . temozolomide (TEMODAR) 250 MG capsule Take 1 capsule by mouth for 5 days, repeat every 4 weeks. 5 capsule 3   No current facility-administered medications on file prior to visit.     Allergies  Allergen Reactions  . Ampicillin Other (See Comments)    Yeast rash   . Fish-Derived Products Other (See Comments)    gout  . Tomato Other (See Comments)    Upset stomach   . Penicillins Rash      Review of Systems ROS Review of Systems - General ROS: negative for - chills, fatigue, fever, hot flashes, night sweats, weight gain or weight loss Psychological ROS: negative for - anxiety, decreased libido, depression, mood swings, physical abuse or sexual abuse Ophthalmic ROS: negative for - blurry vision, eye pain or loss of vision ENT ROS: negative for - headaches, hearing change, visual changes or vocal changes Allergy and Immunology ROS: negative for - hives, itchy/watery eyes or seasonal allergies Hematological and Lymphatic ROS: negative for - bleeding problems, bruising, swollen lymph nodes or weight loss Endocrine ROS: negative for - galactorrhea, hair pattern changes, hot flashes, malaise/lethargy, mood swings, palpitations, polydipsia/polyuria, skin changes, temperature intolerance or unexpected weight changes Breast ROS: negative for - new or changing breast lumps or nipple discharge Respiratory ROS: negative for - cough or shortness of breath Cardiovascular ROS: negative for - chest pain, irregular heartbeat, palpitations or shortness of breath Gastrointestinal ROS: no abdominal pain, change in bowel habits, or black or bloody stools Genito-Urinary ROS: no dysuria, trouble voiding, or hematuria Musculoskeletal ROS: negative for - joint pain or joint stiffness Neurological ROS: negative for - bowel and bladder  control changes Dermatological ROS: negative for rash and skin lesion changes   Objective:   BP (!) 157/97   Pulse 82   Ht 5\' 1"  (1.549 m)   Wt 156 lb (70.8 kg)   BMI 29.48 kg/m  CONSTITUTIONAL: Well-developed, well-nourished female in no acute distress.  PSYCHIATRIC: Normal mood and affect. Normal behavior. Normal judgment and thought content. Waves: Alert and oriented to person, place, and time. Normal muscle tone coordination. No cranial nerve deficit noted. HENT:  Normocephalic, atraumatic, External right and left ear normal. Oropharynx is clear and moist EYES: Conjunctivae and EOM are normal. Pupils are equal, round, and reactive to light. No scleral icterus.  NECK: Normal range of motion, supple, no masses.  Normal thyroid.  SKIN: Skin is warm and dry. No rash noted. Not diaphoretic. No erythema. No pallor. CARDIOVASCULAR: Normal heart  rate noted, regular rhythm, no murmur. RESPIRATORY: Clear to auscultation bilaterally. Effort and breath sounds normal, no problems with respiration noted. BREASTS: Symmetric in size. No masses, skin changes, nipple drainage, or lymphadenopathy. ABDOMEN: Soft, normal bowel sounds, no distention noted.  No tenderness, rebound or guarding.  BLADDER: Normal PELVIC:  Bladder no bladder distension noted  Urethra: normal appearing urethra with no masses, tenderness or lesions  Vulva: normal appearing vulva with no masses, tenderness or lesions  Vagina: atrophic and narrow introitus  Cervix: normal appearing cervix without discharge or lesions and nulliparous os  Uterus: uterus is normal size, shape, consistency and nontender  Adnexa: normal adnexa in size, nontender and no masses  RV: External Exam NormaI, No Rectal Masses and Normal Sphincter tone  MUSCULOSKELETAL: Normal range of motion. No tenderness.  No cyanosis, clubbing, or edema.  2+ distal pulses. LYMPHATIC: No Axillary, Supraclavicular, or Inguinal Adenopathy.    Labs:  Lab Results    Component Value Date   WBC 8.7 02/28/2016   HGB 13.7 02/28/2016   HCT 39.5 02/28/2016   MCV 85.7 02/28/2016   PLT 197 02/28/2016      Chemistry      Component Value Date/Time   NA 142 02/28/2016 1400   K 3.8 02/28/2016 1400   CL 110 02/28/2016 1400   CO2 27 02/28/2016 1400   BUN 27 (H) 02/28/2016 1400   CREATININE 0.78 02/28/2016 1400      Component Value Date/Time   CALCIUM 9.4 02/28/2016 1400   ALKPHOS 81 02/28/2016 1400   AST 20 02/28/2016 1400   ALT 29 02/28/2016 1400   BILITOT 0.9 02/28/2016 1400       Results for Durley, Wilda A (MRN [id number]) as of 03/25/2016 12:02  Ref. Range 02/28/2016 14:00  Glucose Latest Ref Range: 65 - 99 mg/dL 115 (H)     Assessment:   Annual gynecologic examination 70 y.o. Contraception: post menopausal status Overweight H/o thyroid cancer s/p thyroidectomy H/o brain cancer (glioblastoma) with mets H/o DM  Plan:  Pap: Not needed  Mammogram: Ordered. Dexa Scan: ordered Stool Guaiac Testing:  Not performed.  Patient has had colonoscopy within past 2 years.  Labs: Lipid 1 and Hemoglobin A1C Routine preventative health maintenance measures emphasized: Exercise/Diet/Weight control and Stress Management H/o brain cancer, followed by oncology.   H/o DM, patient notes that she was taken off her meds 1-2 years ago as she was told that she "no longer had diabetes".  Discussed that diabetes is a lifelong illness, but can be controlled such that she does not require medication.  Will check HgbA1c.   Return to [place], MD

## 2016-03-26 LAB — LIPID PANEL
CHOL/HDL RATIO: 4.6 ratio — AB (ref 0.0–4.4)
CHOLESTEROL TOTAL: 201 mg/dL — AB (ref 100–199)
HDL: 44 mg/dL (ref >39)
Triglycerides: 405 mg/dL — ABNORMAL HIGH (ref 0–149)

## 2016-03-26 LAB — HEMOGLOBIN A1C
ESTIMATED AVERAGE GLUCOSE: 148 mg/dL
Hgb A1c MFr Bld: 6.8 % — ABNORMAL HIGH (ref 4.8–5.6)

## 2016-04-01 ENCOUNTER — Telehealth: Payer: Self-pay

## 2016-04-01 NOTE — Telephone Encounter (Signed)
-----   Message from Rubie Maid, MD sent at 03/30/2016  3:54 PM EDT ----- Pleae inform patient of borderline elevated cholesterol but very elevated triglycerides (although slightly decreased from 1 year ago).  Should discuss with PCP.  Also, Hgb A1c is 6.8 (down from 7.4 1 year ago).  Still in "diabetic range", would continue to recommend dietary modification (low carb, low cholesterol), exercising at least 30 min daily. Can also discuss further with PCP.

## 2016-04-01 NOTE — Telephone Encounter (Signed)
Pt calls back, informed her of the need for an appt with her PCP as soon as possible to review abnormal labs. Pt gave verbal understanding.

## 2016-04-01 NOTE — Telephone Encounter (Signed)
Called pt & LM for pt to call back

## 2016-04-03 ENCOUNTER — Inpatient Hospital Stay (HOSPITAL_BASED_OUTPATIENT_CLINIC_OR_DEPARTMENT_OTHER): Payer: Medicare Other | Admitting: Internal Medicine

## 2016-04-03 ENCOUNTER — Inpatient Hospital Stay: Payer: Medicare Other | Attending: Internal Medicine

## 2016-04-03 ENCOUNTER — Encounter: Payer: Self-pay | Admitting: Internal Medicine

## 2016-04-03 VITALS — BP 149/96 | HR 74 | Temp 97.3°F | Resp 17 | Ht 61.0 in | Wt 158.4 lb

## 2016-04-03 DIAGNOSIS — Z85850 Personal history of malignant neoplasm of thyroid: Secondary | ICD-10-CM | POA: Diagnosis not present

## 2016-04-03 DIAGNOSIS — Z79899 Other long term (current) drug therapy: Secondary | ICD-10-CM | POA: Diagnosis not present

## 2016-04-03 DIAGNOSIS — C712 Malignant neoplasm of temporal lobe: Secondary | ICD-10-CM | POA: Insufficient documentation

## 2016-04-03 DIAGNOSIS — I10 Essential (primary) hypertension: Secondary | ICD-10-CM

## 2016-04-03 DIAGNOSIS — Z923 Personal history of irradiation: Secondary | ICD-10-CM | POA: Insufficient documentation

## 2016-04-03 DIAGNOSIS — Z87891 Personal history of nicotine dependence: Secondary | ICD-10-CM | POA: Insufficient documentation

## 2016-04-03 DIAGNOSIS — M25571 Pain in right ankle and joints of right foot: Secondary | ICD-10-CM | POA: Insufficient documentation

## 2016-04-03 DIAGNOSIS — C719 Malignant neoplasm of brain, unspecified: Secondary | ICD-10-CM

## 2016-04-03 DIAGNOSIS — E119 Type 2 diabetes mellitus without complications: Secondary | ICD-10-CM | POA: Insufficient documentation

## 2016-04-03 DIAGNOSIS — F329 Major depressive disorder, single episode, unspecified: Secondary | ICD-10-CM | POA: Diagnosis not present

## 2016-04-03 DIAGNOSIS — E785 Hyperlipidemia, unspecified: Secondary | ICD-10-CM | POA: Insufficient documentation

## 2016-04-03 DIAGNOSIS — G40909 Epilepsy, unspecified, not intractable, without status epilepticus: Secondary | ICD-10-CM | POA: Insufficient documentation

## 2016-04-03 LAB — COMPREHENSIVE METABOLIC PANEL
ALT: 32 U/L (ref 14–54)
AST: 24 U/L (ref 15–41)
Albumin: 4.2 g/dL (ref 3.5–5.0)
Alkaline Phosphatase: 86 U/L (ref 38–126)
Anion gap: 6 (ref 5–15)
BILIRUBIN TOTAL: 1.2 mg/dL (ref 0.3–1.2)
BUN: 22 mg/dL — AB (ref 6–20)
CO2: 28 mmol/L (ref 22–32)
CREATININE: 0.78 mg/dL (ref 0.44–1.00)
Calcium: 9.2 mg/dL (ref 8.9–10.3)
Chloride: 105 mmol/L (ref 101–111)
Glucose, Bld: 94 mg/dL (ref 65–99)
Potassium: 3.5 mmol/L (ref 3.5–5.1)
Sodium: 139 mmol/L (ref 135–145)
TOTAL PROTEIN: 6.7 g/dL (ref 6.5–8.1)

## 2016-04-03 LAB — CBC WITH DIFFERENTIAL/PLATELET
BASOS PCT: 1 %
Basophils Absolute: 0.1 10*3/uL (ref 0–0.1)
Eosinophils Absolute: 0.1 10*3/uL (ref 0–0.7)
Eosinophils Relative: 2 %
HEMATOCRIT: 37.9 % (ref 35.0–47.0)
HEMOGLOBIN: 13.6 g/dL (ref 12.0–16.0)
LYMPHS ABS: 1.7 10*3/uL (ref 1.0–3.6)
LYMPHS PCT: 25 %
MCH: 31.1 pg (ref 26.0–34.0)
MCHC: 35.9 g/dL (ref 32.0–36.0)
MCV: 86.7 fL (ref 80.0–100.0)
MONOS PCT: 6 %
Monocytes Absolute: 0.4 10*3/uL (ref 0.2–0.9)
NEUTROS ABS: 4.6 10*3/uL (ref 1.4–6.5)
NEUTROS PCT: 66 %
Platelets: 160 10*3/uL (ref 150–440)
RBC: 4.37 MIL/uL (ref 3.80–5.20)
RDW: 13.9 % (ref 11.5–14.5)
WBC: 7 10*3/uL (ref 3.6–11.0)

## 2016-04-03 NOTE — Progress Notes (Signed)
[place] OFFICE PROGRESS NOTE  Patient Care Team: Carmon Ginsberg, [place] as PCP - General (Family Medicine)  No matching staging information was found for the patient.   Oncology History   1. 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 (January, 2017; s/p resection Dr.Nundkumar; [place]) 2 starting radiation therapy and Temodar January of 2017 3.She has finished radiation therapy in April of 2017 and also finished Temodar for approximately 6 weeks 4.Patient started on maintenance Temodar from Jan 03, 2016; July 2017- MRI no recurrence; left hemisphere- whitish plaques ? 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  female pleasant patient above history of GBM right frontal status post resection and currently on adjuvant Temodar cycle #3 is here for follow-up.  Patient admits to mild nausea after taking Temodar. This improves after taking nausea medications. No vomiting. Denies any new-onset of headache. Denies any seizures. No new weaknesses.  Denies any unusual chest pain or shortness of breath or cough.   She walks with a cane. In general appetite is good. No weight loss. She complains of right ankle pain intermittent. Not any worse.  REVIEW OF SYSTEMS:  A complete 10 point review of system is done which is negative except mentioned above/history of present illness.   PAST MEDICAL HISTORY :  Past Medical History:  Diagnosis Date  . Cancer [facility])    thyroid cancer treated with radioactive iodine  . Depression   . Diabetes mellitus without complication ([place])   . Epilepsy ([place])   . 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 Hyperlipidemia   . Hypertension   . Thyroid disease    h/o  thyroid ca- 30 years ago  . Varicose vein     PAST SURGICAL HISTORY :   Past Surgical History:  Procedure Laterality Date  . APPLICATION OF CRANIAL NAVIGATION N/A 08/02/2015   Procedure: APPLICATION OF CRANIAL NAVIGATION;  Surgeon: Consuella Lose, MD;  Location: [place] NEURO ORS;  Service: Neurosurgery;  Laterality: N/A;  APPLICATION OF CRANIAL NAVIGATION  . BILATERAL CARPAL TUNNEL RELEASE  1990,1991   left hand in 1990 right hand in '91  . CERVICAL SPINE SURGERY  05/1997   spinal fusion  . CHOLECYSTECTOMY  04/24/2008   status post laparoscopic Dr.Ely  . CRANIOTOMY Right 08/02/2015   Procedure: CRANIOTOMY TUMOR EXCISION;  Surgeon: Consuella Lose, MD;  Location: [place] NEURO ORS;  Service: Neurosurgery;  Laterality: Right;  CRANIOTOMY TUMOR EXCISION  . radioactive iodine    . THYROIDECTOMY, PARTIAL  1964   due to cancerous tumor  . TOE SURGERY Left 1989    FAMILY HISTORY :   Family History  Problem Relation Age of Onset  . Arthritis Mother   . Hyperlipidemia Mother   . Hypertension Mother   . Heart disease Mother   . Diabetes Mother     type 2  . Thyroid disease Mother   . Cancer Father     colon  . Alcohol abuse Brother   . Hyperlipidemia Brother   . Hypertension Brother   . Breast cancer Neg Hx   . Ovarian cancer Neg Hx     SOCIAL HISTORY:   Social History  Substance Use Topics  .  Smoking status: Former Research scientist (life sciences)  . Smokeless tobacco: Never Used     Comment: quit smoking in 2007  . Alcohol use No    ALLERGIES:  is allergic to ampicillin; fish-derived products; tomato; and penicillins.  MEDICATIONS:  Current Outpatient Prescriptions  Medication Sig Dispense Refill  . colchicine (COLCRYS) 0.6 MG tablet One pill ever 2-3 days for gout prevention 45 tablet 3  . dexamethasone (DECADRON) 4 MG tablet Take 4 mg by mouth 2 (two) times daily.    Marland Kitchen levETIRAcetam (KEPPRA) 500 MG tablet Take 1 tablet (500 mg total) by mouth 2 (two) times daily. Reported on 12/11/2015    .  levothyroxine (SYNTHROID, LEVOTHROID) 137 MCG tablet TAKE 1 TABLET BY MOUTH EVERY DAY 90 tablet 3  . pantoprazole (PROTONIX) 20 MG tablet Take 1 tablet (20 mg total) by mouth daily. 30 tablet 0  . promethazine (PHENERGAN) 25 MG tablet Take 1 tablet (25 mg total) by mouth every 6 (six) hours as needed for nausea or vomiting. 30 tablet 0  . temozolomide (TEMODAR) 250 MG capsule Take 1 capsule by mouth for 5 days, repeat every 4 weeks. 5 capsule 3  . pravastatin (PRAVACHOL) 40 MG tablet TAKE 1 TABLET BY MOUTH EVERY DAY (Patient not taking: Reported on 04/03/2016) 90 tablet 1   No current facility-administered medications for this visit.     PHYSICAL EXAMINATION: ECOG PERFORMANCE STATUS: 2 - Symptomatic, <50% confined to bed  BP (!) 149/96 (BP Location: Left Arm, Patient Position: Sitting)   Pulse 74   Temp 97.3 F (36.3 C) (Tympanic)   Resp 17   Ht 5\' 1"  (1.549 m)   Wt 158 lb 6.4 oz (71.8 kg)   BMI 29.93 kg/m   Filed Weights   04/03/16 1422  Weight: 158 lb 6.4 oz (71.8 kg)    GENERAL: Well-nourished well-developed; Alert, no distress and comfortable.Walks with a cane. She is accompanied by her friend.  EYES: no pallor or icterus OROPHARYNX: no thrush or ulceration; good dentition  NECK: supple, no masses felt LYMPH:  no palpable lymphadenopathy in the cervical, axillary or inguinal regions LUNGS: clear to auscultation and  No wheeze or crackles HEART/CVS: regular rate & rhythm and no murmurs; No lower extremity edema ABDOMEN:abdomen soft, non-tender and normal bowel sounds Musculoskeletal:no cyanosis of digits and no clubbing  PSYCH: alert & oriented x 3 with fluent speech NEURO: no focal motor/sensory deficits SKIN:  no rashes or significant lesions  LABORATORY DATA:  I have reviewed the data as listed    Component Value Date/Time   NA 139 04/03/2016 1348   NA 148 (H) 11/14/2015 1623   NA 142 11/14/2014 1506   K 3.5 04/03/2016 1348   K 3.8 11/14/2014 1506   CL 105  04/03/2016 1348   CL 110 11/14/2014 1506   CO2 28 04/03/2016 1348   CO2 23 11/14/2014 1506   GLUCOSE 94 04/03/2016 1348   GLUCOSE 104 (H) 11/14/2014 1506   BUN 22 (H) 04/03/2016 1348   BUN 13 11/14/2015 1623   BUN 31 (H) 11/14/2014 1506   CREATININE 0.78 04/03/2016 1348   CREATININE 1.08 (H) 11/14/2014 1506   CALCIUM 9.2 04/03/2016 1348   CALCIUM 9.5 11/14/2014 1506   PROT 6.7 04/03/2016 1348   ALBUMIN 4.2 04/03/2016 1348   ALBUMIN 3.9 11/14/2015 1623   AST 24 04/03/2016 1348   ALT 32 04/03/2016 1348   ALKPHOS 86 04/03/2016 1348   BILITOT 1.2 04/03/2016 1348   GFRNONAA >60 04/03/2016 1348   GFRNONAA  53 (L) 11/14/2014 1506   GFRAA >60 04/03/2016 1348   GFRAA >60 11/14/2014 1506    No results found for: SPEP, UPEP  Lab Results  Component Value Date   WBC 7.0 04/03/2016   NEUTROABS 4.6 04/03/2016   HGB 13.6 04/03/2016   HCT 37.9 04/03/2016   MCV 86.7 04/03/2016   PLT 160 04/03/2016      Chemistry      Component Value Date/Time   NA 139 04/03/2016 1348   NA 148 (H) 11/14/2015 1623   NA 142 11/14/2014 1506   K 3.5 04/03/2016 1348   K 3.8 11/14/2014 1506   CL 105 04/03/2016 1348   CL 110 11/14/2014 1506   CO2 28 04/03/2016 1348   CO2 23 11/14/2014 1506   BUN 22 (H) 04/03/2016 1348   BUN 13 11/14/2015 1623   BUN 31 (H) 11/14/2014 1506   CREATININE 0.78 04/03/2016 1348   CREATININE 1.08 (H) 11/14/2014 1506      Component Value Date/Time   CALCIUM 9.2 04/03/2016 1348   CALCIUM 9.5 11/14/2014 1506   ALKPHOS 86 04/03/2016 1348   AST 24 04/03/2016 1348   ALT 32 04/03/2016 1348   BILITOT 1.2 04/03/2016 1348       RADIOGRAPHIC STUDIES: I have personally reviewed the radiological images as listed and agreed with the findings in the report. No results found.   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 July 21st  2017- MRI shows no  evidence of recurrence in the resection cavity.  #  No clinical evidence of recurrence; continue  adjuvant Temodar every month 6 versus indefinite. Will order MRI at next visit.   # Follow-up with me in 4 weeks with CBC CMP. Will order MRI at next visit.   # Elevated Blood pressure- call us back re: anti-HTN/ steroids.   # call us back re: medications at home.   # right ankle pain- likley arthritis; try tylenol.    Orders Placed This Encounter  Procedures  . CBC with Differential    Standing Status:   Future    Standing Expiration Date:   04/03/2017  . Comprehensive metabolic panel    Standing Status:   Future    Standing Expiration Date:   04/03/2017   All questions were answered. The patient knows to call the clinic with any problems, questions or concerns.      Cammie Sickle, MD 04/03/2016 4:32 PM

## 2016-04-03 NOTE — Assessment & Plan Note (Signed)
Status post resection followed by concurrent chemoradiation- status post cycle # 3 of adjuvant therapy. July 21st  2017- MRI shows no evidence of recurrence in the resection cavity.  #  No clinical evidence of recurrence; continue  adjuvant Temodar every month 6 versus indefinite. Will order MRI at next visit.   # Follow-up with me in 4 weeks with CBC CMP. Will order MRI at next visit.   # Elevated Blood pressure- call us back re: anti-HTN/ steroids.   # call us back re: medications at home.   # right ankle pain- likley arthritis; try tylenol.

## 2016-04-06 ENCOUNTER — Ambulatory Visit (INDEPENDENT_AMBULATORY_CARE_PROVIDER_SITE_OTHER): Payer: Medicare Other | Admitting: Family Medicine

## 2016-04-06 ENCOUNTER — Ambulatory Visit
Admission: RE | Admit: 2016-04-06 | Discharge: 2016-04-06 | Disposition: A | Discharge status: Home / Self Care | Payer: Medicare Other | Source: Ambulatory Visit | Attending: Family Medicine | Admitting: Family Medicine

## 2016-04-06 ENCOUNTER — Telehealth: Payer: Self-pay

## 2016-04-06 VITALS — BP 142/70 | HR 76 | Temp 97.8°F | Resp 15 | Ht 61.0 in | Wt 160.2 lb

## 2016-04-06 DIAGNOSIS — M109 Gout, unspecified: Secondary | ICD-10-CM | POA: Diagnosis not present

## 2016-04-06 DIAGNOSIS — M25532 Pain in left wrist: Secondary | ICD-10-CM | POA: Diagnosis not present

## 2016-04-06 DIAGNOSIS — Z0000 Encounter for general adult medical examination without abnormal findings: Secondary | ICD-10-CM

## 2016-04-06 DIAGNOSIS — M19032 Primary osteoarthritis, left wrist: Secondary | ICD-10-CM | POA: Insufficient documentation

## 2016-04-06 DIAGNOSIS — E785 Hyperlipidemia, unspecified: Secondary | ICD-10-CM | POA: Diagnosis not present

## 2016-04-06 DIAGNOSIS — E039 Hypothyroidism, unspecified: Secondary | ICD-10-CM

## 2016-04-06 DIAGNOSIS — C719 Malignant neoplasm of brain, unspecified: Secondary | ICD-10-CM | POA: Diagnosis not present

## 2016-04-06 NOTE — Telephone Encounter (Signed)
-----   Message from Carmon Ginsberg, [place] sent at 04/06/2016 12:47 PM EDT ----- Your pain is from arthritis, no fractures.

## 2016-04-06 NOTE — Progress Notes (Signed)
Subjective:     Patient ID: Martha [name], female   DOB: 1946-08-01, 70 y.o.   MRN: [id number]  HPI  Chief Complaint  Patient presents with  . Annual Exam    Patient comes in office today for her annual physical,patient would like to address pain in her right foot and left hand. Patient last reported: Tdap 01/22/11, Pneumo 08/03/15, Prevnar and Flu vaccine are due today. Patient recently was seen at [facility] , patients last reported mammogram was 10/07/10 and colonoscopy 02/26/2010  States she is about to start a new chemo rx cycle so have deferred shots until afterwards. Has had recent health maintenance exam by ob-gyn with labs/screening and recent labs by her oncologist. She had A1C of 6.8 despite being on steroids and not on diabetic medication. Reports she has a 3 wheeler bike she pedals for exercise. Wears low cut socks and has seen swelling above the sock line at the ankles. Has been wearing a splint for months due to left wrist pain.   Review of Systems General: Feeling well; usually uses a single point cane to get around. HEENT: regular dental visits but is overdue for an eye appointment and is encouraged to schedule. Cardiovascular: no chest pain, shortness of breath, or palpitations GI: no heartburn, no change in bowel habits or blood in the stool GU: nocturia x 1, no change in bladder habits  Neuro: Reports memory improving  since brain surgery-"75%" Psychiatric: not depressed Musculoskeletal: Reports taking nsaid's once daily for her orthopedic pain. States colchicine keeps her gout under control.    Objective:   Physical Exam  Constitutional: She appears well-developed and well-nourished. No distress.  Eyes: PERRLA, EOMI Neck: no thyromegaly, tenderness or nodules, no cervical adenopathy, or carotid bruit. ENT: TM's intact without inflammation; tonsils absent Lungs: Clear Heart : RRR without murmur or gallop Abd: bowel sounds present, soft, non-tender, no  organomegaly Extremities: slight edema above her elastic sock line. Left grip 5/5, Tender over the left radial aspect of her wrist and first metacarpal. Neuro: 6CIT score 8      Assessment:    1. Medicare annual wellness visit, subsequent  2. Hypothyroidism, unspecified hypothyroidism type - T4, free - TSH  3. HLD (hyperlipidemia): continue pravastatin  4. 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 Left wrist pain - DG Wrist Complete Left; Future  6. Gout of right foot, unspecified cause, unspecified chronicity - Uric acid    Plan:    Update vaccines after getting next round of chemo. Schedule an eye exam. Further f/u pending lab and x-ray report.

## 2016-04-06 NOTE — Progress Notes (Signed)
Subjective:     Patient ID: Martha [name], female   DOB: 07/12/1946, 70 y.o.   MRN: [id number]  HPI   Review of Systems     Objective:   Physical Exam  1.What year is it?          0 or 4-------0 2.What month is it?       0 or 3------0 3.Remember the following address (ask patient to repeat and remember for later)                           Janice Norrie                          [address] [place]  4.What time is it?          0 or 4-------0 5. Count backwards from 20-1     0 or 2 (1 error) or 4-----0 6. Months of the year backwards       0 or 2 or 4-------0 7. Repeat previous memory phrase   0 or 3 (one error) or 4 (2 errors) or 6 (3 errors) or 8 (4 errors) or 10 (all incorrect)--------8 ______________________________________________________________________ Total score:8 0-7 normal   8-9 mild cognitive impairment    10-28 significant cognitive impairment     Assessment:        Plan:

## 2016-04-06 NOTE — Patient Instructions (Addendum)
Do schedule an eye exam. We will call you with the x-ray report and labs. Once you have completed this cycle of chemo come in for a pneumonia and flu shot.

## 2016-04-06 NOTE — Telephone Encounter (Signed)
Left message to call back  

## 2016-04-07 ENCOUNTER — Telehealth: Payer: Self-pay

## 2016-04-07 LAB — URIC ACID: Uric Acid: 7 mg/dL (ref 2.5–7.1)

## 2016-04-07 LAB — TSH: TSH: 0.047 u[IU]/mL — AB (ref 0.450–4.500)

## 2016-04-07 LAB — T4, FREE: FREE T4: 1.78 ng/dL — AB (ref 0.82–1.77)

## 2016-04-07 NOTE — Telephone Encounter (Signed)
Patient has been advised. KW 

## 2016-04-07 NOTE — Telephone Encounter (Signed)
-----   Message from Carmon Ginsberg, [place] sent at 04/07/2016  7:47 AM EDT ----- Slightly hyperthyroid but as you are feeling well will recheck when we see you in 3 months. Uric acid levels are within normal limits but would continue the colchicine daily.

## 2016-04-09 ENCOUNTER — Other Ambulatory Visit: Payer: Self-pay | Admitting: Oncology

## 2016-04-09 DIAGNOSIS — C719 Malignant neoplasm of brain, unspecified: Secondary | ICD-10-CM

## 2016-04-30 ENCOUNTER — Other Ambulatory Visit: Payer: Self-pay | Admitting: *Deleted

## 2016-04-30 DIAGNOSIS — C719 Malignant neoplasm of brain, unspecified: Secondary | ICD-10-CM

## 2016-04-30 MED ORDER — TEMOZOLOMIDE 250 MG PO CAPS: ORAL_CAPSULE | ORAL | 3 refills | Status: DC

## 2016-05-01 ENCOUNTER — Inpatient Hospital Stay: Payer: Medicare Other

## 2016-05-01 ENCOUNTER — Inpatient Hospital Stay: Payer: Medicare Other | Attending: Internal Medicine | Admitting: Internal Medicine

## 2016-05-01 ENCOUNTER — Encounter: Payer: Self-pay | Admitting: Internal Medicine

## 2016-05-01 VITALS — BP 154/101 | HR 90 | Temp 97.7°F | Resp 17 | Ht 61.0 in | Wt 162.6 lb

## 2016-05-01 DIAGNOSIS — Z87891 Personal history of nicotine dependence: Secondary | ICD-10-CM | POA: Diagnosis not present

## 2016-05-01 DIAGNOSIS — Z79899 Other long term (current) drug therapy: Secondary | ICD-10-CM | POA: Insufficient documentation

## 2016-05-01 DIAGNOSIS — G4489 Other headache syndrome: Secondary | ICD-10-CM

## 2016-05-01 DIAGNOSIS — Z923 Personal history of irradiation: Secondary | ICD-10-CM | POA: Diagnosis not present

## 2016-05-01 DIAGNOSIS — Z85850 Personal history of malignant neoplasm of thyroid: Secondary | ICD-10-CM | POA: Insufficient documentation

## 2016-05-01 DIAGNOSIS — E785 Hyperlipidemia, unspecified: Secondary | ICD-10-CM | POA: Insufficient documentation

## 2016-05-01 DIAGNOSIS — C719 Malignant neoplasm of brain, unspecified: Secondary | ICD-10-CM

## 2016-05-01 DIAGNOSIS — R51 Headache: Secondary | ICD-10-CM | POA: Diagnosis not present

## 2016-05-01 DIAGNOSIS — F329 Major depressive disorder, single episode, unspecified: Secondary | ICD-10-CM | POA: Insufficient documentation

## 2016-05-01 DIAGNOSIS — I10 Essential (primary) hypertension: Secondary | ICD-10-CM | POA: Insufficient documentation

## 2016-05-01 DIAGNOSIS — C712 Malignant neoplasm of temporal lobe: Secondary | ICD-10-CM | POA: Insufficient documentation

## 2016-05-01 DIAGNOSIS — G40909 Epilepsy, unspecified, not intractable, without status epilepticus: Secondary | ICD-10-CM | POA: Diagnosis not present

## 2016-05-01 DIAGNOSIS — M25571 Pain in right ankle and joints of right foot: Secondary | ICD-10-CM | POA: Diagnosis not present

## 2016-05-01 DIAGNOSIS — M1990 Unspecified osteoarthritis, unspecified site: Secondary | ICD-10-CM | POA: Insufficient documentation

## 2016-05-01 DIAGNOSIS — E119 Type 2 diabetes mellitus without complications: Secondary | ICD-10-CM | POA: Diagnosis not present

## 2016-05-01 LAB — CBC WITH DIFFERENTIAL/PLATELET
BASOS ABS: 0.1 10*3/uL (ref 0–0.1)
Basophils Relative: 1 %
EOS ABS: 0.1 10*3/uL (ref 0–0.7)
EOS PCT: 2 %
HCT: 37 % (ref 35.0–47.0)
HEMOGLOBIN: 13.1 g/dL (ref 12.0–16.0)
LYMPHS PCT: 22 %
Lymphs Abs: 1.6 10*3/uL (ref 1.0–3.6)
MCH: 30.9 pg (ref 26.0–34.0)
MCHC: 35.4 g/dL (ref 32.0–36.0)
MCV: 87.3 fL (ref 80.0–100.0)
Monocytes Absolute: 0.5 10*3/uL (ref 0.2–0.9)
Monocytes Relative: 7 %
NEUTROS PCT: 68 %
Neutro Abs: 4.9 10*3/uL (ref 1.4–6.5)
PLATELETS: 187 10*3/uL (ref 150–440)
RBC: 4.24 MIL/uL (ref 3.80–5.20)
RDW: 13.3 % (ref 11.5–14.5)
WBC: 7.2 10*3/uL (ref 3.6–11.0)

## 2016-05-01 LAB — COMPREHENSIVE METABOLIC PANEL
ALK PHOS: 84 U/L (ref 38–126)
ALT: 30 U/L (ref 14–54)
AST: 20 U/L (ref 15–41)
Albumin: 4 g/dL (ref 3.5–5.0)
Anion gap: 6 (ref 5–15)
BUN: 23 mg/dL — AB (ref 6–20)
CHLORIDE: 107 mmol/L (ref 101–111)
CO2: 30 mmol/L (ref 22–32)
CREATININE: 0.85 mg/dL (ref 0.44–1.00)
Calcium: 9.2 mg/dL (ref 8.9–10.3)
GFR calc Af Amer: >60 mL/min (ref >60)
GFR calc non Af Amer: >60 mL/min (ref >60)
GLUCOSE: 107 mg/dL — AB (ref 65–99)
Potassium: 3.5 mmol/L (ref 3.5–5.1)
SODIUM: 143 mmol/L (ref 135–145)
Total Bilirubin: 0.7 mg/dL (ref 0.3–1.2)
Total Protein: 6.5 g/dL (ref 6.5–8.1)

## 2016-05-01 MED ORDER — LISINOPRIL-HYDROCHLOROTHIAZIDE 20-25 MG PO TABS: 1.0000 | ORAL_TABLET | Freq: Every day | ORAL | 3 refills | Status: DC

## 2016-05-01 MED ORDER — DEXAMETHASONE 4 MG PO TABS: 4.0000 mg | ORAL_TABLET | Freq: Three times a day (TID) | ORAL | 0 refills | Status: DC

## 2016-05-01 NOTE — Progress Notes (Signed)
Pt complains today of recent nausea and vomiting and lasting headaches.  Pt has elevated BP today

## 2016-05-01 NOTE — Assessment & Plan Note (Addendum)
Status post resection followed by concurrent chemoradiation- status post cycle # 3 of adjuvant therapy. July 21st  2017- MRI shows no evidence of recurrence in the resection cavity.  # Headaches ? Etiology- check MRI of brain ASAP. Start dex 4 TID.   # Elevated Blood pressure- start lisinopril- hctz one day.   # right ankle pain- likley arthritis; try tylenol.   # Follow-up in few days after the MRI.

## 2016-05-01 NOTE — Progress Notes (Signed)
[facility] OFFICE PROGRESS NOTE  Patient Care Team: Carmon Ginsberg, [place] as PCP - General (Family Medicine)  No matching staging information was found for the patient.   Oncology History   1. 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 (January, 2017; s/p resection Dr.Nundkumar; [place]) 2 starting radiation therapy and Temodar January of 2017 3.She has finished radiation therapy in April of 2017 and also finished Temodar for approximately 6 weeks 4.Patient started on maintenance Temodar from Jan 03, 2016; July 2017- MRI no recurrence; left hemisphere- whitish plaques ? 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  female pleasant patient above history of GBM right frontal status post resection and currently on adjuvant Temodar- Is here for follow-up  Patient noted to have increasing headaches in the last 2 weeks. Associated with nausea and vomiting.  She has been taking up with headaches.    Denies any seizures. No new weaknesses. Denies any unusual chest pain or shortness of breath or cough.  She walks with a cane. In general appetite is good. No weight loss. She complains of right ankle pain intermittent. Not any worse.  REVIEW OF SYSTEMS:  A complete 10 point review of system is done which is negative except mentioned above/history of present illness.   PAST MEDICAL HISTORY :  Past Medical History:  Diagnosis Date  . Cancer [facility])    thyroid cancer treated with radioactive iodine  . Depression   . Diabetes mellitus without complication ([place])   . Epilepsy ([place])   . 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 Hyperlipidemia   . Hypertension   . Thyroid disease    h/o thyroid ca- 30  years ago  . Varicose vein     PAST SURGICAL HISTORY :   Past Surgical History:  Procedure Laterality Date  . APPLICATION OF CRANIAL NAVIGATION N/A 08/02/2015   Procedure: APPLICATION OF CRANIAL NAVIGATION;  Surgeon: Consuella Lose, MD;  Location: [facility] NEURO ORS;  Service: Neurosurgery;  Laterality: N/A;  APPLICATION OF CRANIAL NAVIGATION  . BILATERAL CARPAL TUNNEL RELEASE  1990,1991   left hand in 1990 right hand in '91  . CERVICAL SPINE SURGERY  05/1997   spinal fusion  . CHOLECYSTECTOMY  04/24/2008   status post laparoscopic Dr.Ely  . CRANIOTOMY Right 08/02/2015   Procedure: CRANIOTOMY TUMOR EXCISION;  Surgeon: Consuella Lose, MD;  Location: [place] NEURO ORS;  Service: Neurosurgery;  Laterality: Right;  CRANIOTOMY TUMOR EXCISION  . radioactive iodine    . THYROIDECTOMY, PARTIAL  1964   due to cancerous tumor  . TOE SURGERY Left 1989    FAMILY HISTORY :   Family History  Problem Relation Age of Onset  . Arthritis Mother   . Hyperlipidemia Mother   . Hypertension Mother   . Heart disease Mother   . Diabetes Mother     type 2  . Thyroid disease Mother   . Cancer Father     colon  . Alcohol abuse Brother   . Hyperlipidemia Brother   . Hypertension Brother   . Breast cancer Neg Hx   . Ovarian cancer Neg Hx     SOCIAL HISTORY:   Social History  Substance Use Topics  .  Smoking status: Former Research scientist (life sciences)  . Smokeless tobacco: Never Used     Comment: quit smoking in 2007  . Alcohol use No    ALLERGIES:  is allergic to ampicillin; fish-derived products; tomato; and penicillins.  MEDICATIONS:  Current Outpatient Prescriptions  Medication Sig Dispense Refill  . colchicine (COLCRYS) 0.6 MG tablet One pill ever 2-3 days for gout prevention 45 tablet 3  . dexamethasone (DECADRON) 4 MG tablet Take 1 tablet (4 mg total) by mouth 3 (three) times daily. 30 tablet 0  . ibuprofen (ADVIL,MOTRIN) 200 MG tablet Take 200 mg by mouth every 6 (six) hours as needed.    . levETIRAcetam  (KEPPRA) 500 MG tablet Take 1 tablet (500 mg total) by mouth 2 (two) times daily. Reported on 12/11/2015    . levothyroxine (SYNTHROID, LEVOTHROID) 137 MCG tablet TAKE 1 TABLET BY MOUTH EVERY DAY 90 tablet 3  . pantoprazole (PROTONIX) 20 MG tablet Take 1 tablet (20 mg total) by mouth daily. 30 tablet 0  . pravastatin (PRAVACHOL) 40 MG tablet TAKE 1 TABLET BY MOUTH EVERY DAY 90 tablet 1  . promethazine (PHENERGAN) 25 MG tablet TAKE 1 TABLET (25 MG TOTAL) BY MOUTH EVERY 6 (SIX) HOURS AS NEEDED FOR NAUSEA OR VOMITING. 30 tablet 0  . temozolomide (TEMODAR) 250 MG capsule Take 1 capsule by mouth for 5 days, repeat every 4 weeks. 5 capsule 3  . lisinopril-hydrochlorothiazide (PRINZIDE,ZESTORETIC) 20-25 MG tablet Take 1 tablet by mouth daily. 30 tablet 3   No current facility-administered medications for this visit.     PHYSICAL EXAMINATION: ECOG PERFORMANCE STATUS: 2 - Symptomatic, <50% confined to bed  BP (!) 154/101 (BP Location: Left Arm, Patient Position: Sitting)   Pulse 90   Temp 97.7 F (36.5 C) (Tympanic)   Resp 17   Ht 5\' 1"  (1.549 m)   Wt 162 lb 9.6 oz (73.8 kg)   BMI 30.72 kg/m   Filed Weights   05/01/16 1418  Weight: 162 lb 9.6 oz (73.8 kg)    GENERAL: Well-nourished well-developed; Alert, no distress and comfortable.Walks with a cane. She is accompanied by her friend.  EYES: no pallor or icterus OROPHARYNX: no thrush or ulceration; good dentition  NECK: supple, no masses felt LYMPH:  no palpable lymphadenopathy in the cervical, axillary or inguinal regions LUNGS: clear to auscultation and  No wheeze or crackles HEART/CVS: regular rate & rhythm and no murmurs; No lower extremity edema ABDOMEN:abdomen soft, non-tender and normal bowel sounds Musculoskeletal:no cyanosis of digits and no clubbing  PSYCH: alert & oriented x 3 with fluent speech NEURO: no focal motor/sensory deficits SKIN:  no rashes or significant lesions  LABORATORY DATA:  I have reviewed the data as  listed    Component Value Date/Time   NA 143 05/01/2016 1400   NA 148 (H) 11/14/2015 1623   NA 142 11/14/2014 1506   K 3.5 05/01/2016 1400   K 3.8 11/14/2014 1506   CL 107 05/01/2016 1400   CL 110 11/14/2014 1506   CO2 30 05/01/2016 1400   CO2 23 11/14/2014 1506   GLUCOSE 107 (H) 05/01/2016 1400   GLUCOSE 104 (H) 11/14/2014 1506   BUN 23 (H) 05/01/2016 1400   BUN 13 11/14/2015 1623   BUN 31 (H) 11/14/2014 1506   CREATININE 0.85 05/01/2016 1400   CREATININE 1.08 (H) 11/14/2014 1506   CALCIUM 9.2 05/01/2016 1400   CALCIUM 9.5 11/14/2014 1506   PROT 6.5 05/01/2016 1400   ALBUMIN 4.0 05/01/2016 1400   ALBUMIN 3.9  11/14/2015 1623   AST 20 05/01/2016 1400   ALT 30 05/01/2016 1400   ALKPHOS 84 05/01/2016 1400   BILITOT 0.7 05/01/2016 1400   GFRNONAA >60 05/01/2016 1400   GFRNONAA 53 (L) 11/14/2014 1506   GFRAA >60 05/01/2016 1400   GFRAA >60 11/14/2014 1506    No results found for: SPEP, UPEP  Lab Results  Component Value Date   WBC 7.2 05/01/2016   NEUTROABS 4.9 05/01/2016   HGB 13.1 05/01/2016   HCT 37.0 05/01/2016   MCV 87.3 05/01/2016   PLT 187 05/01/2016      Chemistry      Component Value Date/Time   NA 143 05/01/2016 1400   NA 148 (H) 11/14/2015 1623   NA 142 11/14/2014 1506   K 3.5 05/01/2016 1400   K 3.8 11/14/2014 1506   CL 107 05/01/2016 1400   CL 110 11/14/2014 1506   CO2 30 05/01/2016 1400   CO2 23 11/14/2014 1506   BUN 23 (H) 05/01/2016 1400   BUN 13 11/14/2015 1623   BUN 31 (H) 11/14/2014 1506   CREATININE 0.85 05/01/2016 1400   CREATININE 1.08 (H) 11/14/2014 1506      Component Value Date/Time   CALCIUM 9.2 05/01/2016 1400   CALCIUM 9.5 11/14/2014 1506   ALKPHOS 84 05/01/2016 1400   AST 20 05/01/2016 1400   ALT 30 05/01/2016 1400   BILITOT 0.7 05/01/2016 1400       RADIOGRAPHIC STUDIES: I have personally reviewed the radiological images as listed and agreed with the findings in the report. No results found.   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 July 21st  2017- MRI shows no evidence of recurrence in the resection cavity.  # Headaches ? Etiology- check MRI of brain ASAP. Start dex 4 TID.   # Elevated Blood pressure- start lisinopril- hctz one day.   # right ankle pain- likley arthritis; try tylenol.   # Follow-up in few days after the MRI.   Orders Placed This Encounter  Procedures  . MR Brain W Wo Contrast    Standing Status:   Future    Standing Expiration Date:   07/01/2017    Order Specific Question:   Reason for Exam (SYMPTOM  OR DIAGNOSIS REQUIRED)    Answer:   GBM- headaches    Order Specific Question:   Preferred imaging location?    Answer:   [facility] [facility] (table limit-300lbs)    Order Specific Question:   Does the patient have a pacemaker or implanted devices?    Answer:   No    Order Specific Question:   What is the patient's sedation requirement?    Answer:   No Sedation   All questions were answered. The patient knows to call the clinic with any problems, questions or concerns.      Cammie Sickle, MD 05/01/2016 2:51 PM

## 2016-05-01 NOTE — Addendum Note (Signed)
Addended by: Sabino Gasser on: 05/01/2016 03:07 PM   Modules accepted: Orders

## 2016-05-04 ENCOUNTER — Ambulatory Visit
Admission: RE | Admit: 2016-05-04 | Discharge: 2016-05-04 | Disposition: A | Discharge status: Home / Self Care | Payer: Medicare Other | Source: Ambulatory Visit | Attending: Internal Medicine | Admitting: Internal Medicine

## 2016-05-04 DIAGNOSIS — C719 Malignant neoplasm of brain, unspecified: Secondary | ICD-10-CM | POA: Diagnosis not present

## 2016-05-04 DIAGNOSIS — G43909 Migraine, unspecified, not intractable, without status migrainosus: Secondary | ICD-10-CM | POA: Diagnosis not present

## 2016-05-04 MED ORDER — GADOBENATE DIMEGLUMINE 529 MG/ML IV SOLN
15.0000 mL | Freq: Once | INTRAVENOUS | Status: AC | PRN
  Administered 2016-05-04: 15 mL via INTRAVENOUS

## 2016-05-06 ENCOUNTER — Inpatient Hospital Stay (HOSPITAL_BASED_OUTPATIENT_CLINIC_OR_DEPARTMENT_OTHER): Payer: Medicare Other | Admitting: Internal Medicine

## 2016-05-06 ENCOUNTER — Encounter: Payer: Self-pay | Admitting: Internal Medicine

## 2016-05-06 DIAGNOSIS — Z923 Personal history of irradiation: Secondary | ICD-10-CM

## 2016-05-06 DIAGNOSIS — Z79899 Other long term (current) drug therapy: Secondary | ICD-10-CM

## 2016-05-06 DIAGNOSIS — M25571 Pain in right ankle and joints of right foot: Secondary | ICD-10-CM

## 2016-05-06 DIAGNOSIS — C712 Malignant neoplasm of temporal lobe: Secondary | ICD-10-CM | POA: Diagnosis not present

## 2016-05-06 DIAGNOSIS — Z85850 Personal history of malignant neoplasm of thyroid: Secondary | ICD-10-CM | POA: Diagnosis not present

## 2016-05-06 DIAGNOSIS — C719 Malignant neoplasm of brain, unspecified: Secondary | ICD-10-CM

## 2016-05-06 DIAGNOSIS — R51 Headache: Secondary | ICD-10-CM | POA: Diagnosis not present

## 2016-05-06 NOTE — Progress Notes (Signed)
[facility] OFFICE PROGRESS NOTE  Patient Care Team: Carmon Ginsberg, [place] as PCP - General (Family Medicine)  No matching staging information was found for the patient.   Oncology History   1. 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 (January, 2017; s/p resection Dr.Nundkumar; [place]) 2 starting radiation therapy and Temodar January of 2017 3.She has finished radiation therapy in April of 2017 and also finished Temodar for approximately 6 weeks 4.Patient started on maintenance Temodar from Jan 03, 2016; July 2017- MRI no recurrence; left hemisphere- whitish plaques ? 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  female pleasant patient above history of GBM right frontal status post resection and currently on adjuvant Temodar- Is here for follow-upTo review the results of MRI.  Patient's headaches have improved on the dexamethasone. She unfortunately has not started taking her blood pressure medication yet. Patient has intermittent difficult sleeping at night. Otherwise denies any seizures. No new weaknesses. Denies any unusual chest pain or shortness of breath or cough.  She walks with a cane. In general appetite is good. No weight loss.   REVIEW OF SYSTEMS:  A complete 10 point review of system is done which is negative except mentioned above/history of present illness.   PAST MEDICAL HISTORY :  Past Medical History:  Diagnosis Date  . Cancer [facility])    thyroid cancer treated with radioactive iodine  . Depression   . Diabetes mellitus without complication ([place])   . Epilepsy ([place])   . 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  Hyperlipidemia   . Hypertension   . Thyroid disease    h/o thyroid ca- 30 years ago  . Varicose vein     PAST SURGICAL HISTORY :   Past Surgical History:  Procedure Laterality Date  . APPLICATION OF CRANIAL NAVIGATION N/A 08/02/2015   Procedure: APPLICATION OF CRANIAL NAVIGATION;  Surgeon: Consuella Lose, MD;  Location: [place] [facility] NEURO ORS;  Service: Neurosurgery;  Laterality: N/A;  APPLICATION OF CRANIAL NAVIGATION  . BILATERAL CARPAL TUNNEL RELEASE  1990,1991   left hand in 1990 right hand in '91  . CERVICAL SPINE SURGERY  05/1997   spinal fusion  . CHOLECYSTECTOMY  04/24/2008   status post laparoscopic Dr.Ely  . CRANIOTOMY Right 08/02/2015   Procedure: CRANIOTOMY TUMOR EXCISION;  Surgeon: Consuella Lose, MD;  Location: [place] NEURO ORS;  Service: Neurosurgery;  Laterality: Right;  CRANIOTOMY TUMOR EXCISION  . radioactive iodine    . THYROIDECTOMY, PARTIAL  1964   due to cancerous tumor  . TOE SURGERY Left 1989    FAMILY HISTORY :   Family History  Problem Relation Age of Onset  . Arthritis Mother   . Hyperlipidemia Mother   . Hypertension Mother   . Heart disease Mother   . Diabetes Mother     type 2  . Thyroid disease Mother   . Cancer Father     colon  . Alcohol abuse Brother   . Hyperlipidemia Brother   . Hypertension Brother   . Breast cancer Neg Hx   . Ovarian cancer Neg Hx     SOCIAL HISTORY:  Social History  Substance Use Topics  . Smoking status: Former Research scientist (life sciences)  . Smokeless tobacco: Never Used     Comment: quit smoking in 2007  . Alcohol use No    ALLERGIES:  is allergic to ampicillin; fish-derived products; tomato; and penicillins.  MEDICATIONS:  Current Outpatient Prescriptions  Medication Sig Dispense Refill  . colchicine (COLCRYS) 0.6 MG tablet One pill ever 2-3 days for gout prevention 45 tablet 3  . dexamethasone (DECADRON) 4 MG tablet Take 1 tablet (4 mg total) by mouth 3 (three) times daily. 30 tablet 0  . ibuprofen (ADVIL,MOTRIN) 200 MG  tablet Take 200 mg by mouth every 6 (six) hours as needed.    . levETIRAcetam (KEPPRA) 500 MG tablet Take 1 tablet (500 mg total) by mouth 2 (two) times daily. Reported on 12/11/2015    . levothyroxine (SYNTHROID, LEVOTHROID) 137 MCG tablet TAKE 1 TABLET BY MOUTH EVERY DAY 90 tablet 3  . lisinopril-hydrochlorothiazide (PRINZIDE,ZESTORETIC) 20-25 MG tablet Take 1 tablet by mouth daily. 30 tablet 3  . pantoprazole (PROTONIX) 20 MG tablet Take 1 tablet (20 mg total) by mouth daily. 30 tablet 0  . pravastatin (PRAVACHOL) 40 MG tablet TAKE 1 TABLET BY MOUTH EVERY DAY 90 tablet 1  . temozolomide (TEMODAR) 250 MG capsule Take 1 capsule by mouth for 5 days, repeat every 4 weeks. 5 capsule 3  . promethazine (PHENERGAN) 25 MG tablet TAKE 1 TABLET (25 MG TOTAL) BY MOUTH EVERY 6 (SIX) HOURS AS NEEDED FOR NAUSEA OR VOMITING. (Patient not taking: Reported on 05/06/2016) 30 tablet 0   No current facility-administered medications for this visit.     PHYSICAL EXAMINATION: ECOG PERFORMANCE STATUS: 2 - Symptomatic, <50% confined to bed  BP (!) 175/108 (BP Location: Left Arm, Patient Position: Sitting)   Pulse 79   Temp 97.4 F (36.3 C) (Tympanic)   Resp 16   Ht 5\' 1"  (1.549 m)   Wt 161 lb 9.6 oz (73.3 kg)   BMI 30.53 kg/m   Filed Weights   05/06/16 1452  Weight: 161 lb 9.6 oz (73.3 kg)    GENERAL: Well-nourished well-developed; Alert, no distress and comfortable.Walks with a cane. She is accompanied by her friend.  EYES: no pallor or icterus OROPHARYNX: no thrush or ulceration; good dentition  NECK: supple, no masses felt LYMPH:  no palpable lymphadenopathy in the cervical, axillary or inguinal regions LUNGS: clear to auscultation and  No wheeze or crackles HEART/CVS: regular rate & rhythm and no murmurs; No lower extremity edema ABDOMEN:abdomen soft, non-tender and normal bowel sounds Musculoskeletal:no cyanosis of digits and no clubbing  PSYCH: alert & oriented x 3 with fluent speech NEURO: no  focal motor/sensory deficits SKIN:  no rashes or significant lesions  LABORATORY DATA:  I have reviewed the data as listed    Component Value Date/Time   NA 143 05/01/2016 1400   NA 148 (H) 11/14/2015 1623   NA 142 11/14/2014 1506   K 3.5 05/01/2016 1400   K 3.8 11/14/2014 1506   CL 107 05/01/2016 1400   CL 110 11/14/2014 1506   CO2 30 05/01/2016 1400   CO2 23 11/14/2014 1506   GLUCOSE 107 (H) 05/01/2016 1400   GLUCOSE 104 (H) 11/14/2014 1506   BUN 23 (H) 05/01/2016 1400   BUN 13 11/14/2015 1623   BUN 31 (H) 11/14/2014 1506   CREATININE 0.85 05/01/2016 1400   CREATININE 1.08 (H) 11/14/2014 1506   CALCIUM 9.2 05/01/2016 1400   CALCIUM 9.5 11/14/2014 1506  PROT 6.5 05/01/2016 1400   ALBUMIN 4.0 05/01/2016 1400   ALBUMIN 3.9 11/14/2015 1623   AST 20 05/01/2016 1400   ALT 30 05/01/2016 1400   ALKPHOS 84 05/01/2016 1400   BILITOT 0.7 05/01/2016 1400   GFRNONAA >60 05/01/2016 1400   GFRNONAA 53 (L) 11/14/2014 1506   GFRAA >60 05/01/2016 1400   GFRAA >60 11/14/2014 1506    No results found for: SPEP, UPEP  Lab Results  Component Value Date   WBC 7.2 05/01/2016   NEUTROABS 4.9 05/01/2016   HGB 13.1 05/01/2016   HCT 37.0 05/01/2016   MCV 87.3 05/01/2016   PLT 187 05/01/2016      Chemistry      Component Value Date/Time   NA 143 05/01/2016 1400   NA 148 (H) 11/14/2015 1623   NA 142 11/14/2014 1506   K 3.5 05/01/2016 1400   K 3.8 11/14/2014 1506   CL 107 05/01/2016 1400   CL 110 11/14/2014 1506   CO2 30 05/01/2016 1400   CO2 23 11/14/2014 1506   BUN 23 (H) 05/01/2016 1400   BUN 13 11/14/2015 1623   BUN 31 (H) 11/14/2014 1506   CREATININE 0.85 05/01/2016 1400   CREATININE 1.08 (H) 11/14/2014 1506      Component Value Date/Time   CALCIUM 9.2 05/01/2016 1400   CALCIUM 9.5 11/14/2014 1506   ALKPHOS 84 05/01/2016 1400   AST 20 05/01/2016 1400   ALT 30 05/01/2016 1400   BILITOT 0.7 05/01/2016 1400       RADIOGRAPHIC STUDIES: I have personally reviewed  the radiological images as listed and agreed with the findings in the report. No results found.   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 SEP 2017- MRI shows no definite evidence of recurrence in the resection cavity; slight enhancement noted around the cavity.  # Recommend continuing adjuvant Temodar;  Teomodar pills- restart now.   # Headaches-MRI brain- recommend taper dex over 1 week. Call us if headaches start to get worse. Left message for neurosurgery re: pt's imaging.   # Elevated Blood pressure-did not start lisinopril- hctz one day. Recommend starting ASAP  # follow up in 2 weeks.   # I reviewed the blood work/imaging- with the patient in detail; with the patient in detail.    No orders of the defined types were placed in this encounter.  All questions were answered. The patient knows to call the clinic with any problems, questions or concerns.      Cammie Sickle, MD 05/06/2016 3:51 PM

## 2016-05-06 NOTE — Progress Notes (Signed)
Pt complains still of a mild headache.  No more nausea, however blood pressure remains elevated.

## 2016-05-06 NOTE — Assessment & Plan Note (Addendum)
Status post resection followed by concurrent chemoradiation- status post cycle # 3 of adjuvant therapy. SEP 2017- MRI shows no definite evidence of recurrence in the resection cavity; slight enhancement noted around the cavity.  # Recommend continuing adjuvant Temodar;  Teomodar pills- restart now.   # Headaches-MRI brain- recommend taper dex over 1 week. Call us if headaches start to get worse. Left message for neurosurgery re: pt's imaging.   # Elevated Blood pressure-did not start lisinopril- hctz one day. Recommend starting ASAP  # follow up in 2 weeks.   # I reviewed the blood work/imaging- with the patient in detail; with the patient in detail.

## 2016-05-11 ENCOUNTER — Telehealth: Payer: Self-pay | Admitting: *Deleted

## 2016-05-11 NOTE — Telephone Encounter (Signed)
Advised this was electronically sent on 9/21, She will call pharmacy

## 2016-05-13 ENCOUNTER — Telehealth: Payer: Self-pay

## 2016-05-13 NOTE — Telephone Encounter (Signed)
Returned [employer] phone call about pt restarting Temodar.  Prescription confirmed with pharmacy.  No further issues.  Script to be filled

## 2016-05-14 ENCOUNTER — Ambulatory Visit: Admission: RE | Admit: 2016-05-14 | Payer: Medicare Other | Source: Ambulatory Visit

## 2016-05-18 ENCOUNTER — Telehealth: Payer: Self-pay | Admitting: *Deleted

## 2016-05-18 NOTE — Telephone Encounter (Signed)
Call acknowledged.

## 2016-05-18 NOTE — Telephone Encounter (Signed)
Upset because she has not heard from pharmacy regarding Temodar. I called and spoke with [employer] and she stated it has been appd form her insurance and she will send a message to push this through to get it sent out ASAP since the start date was for today. Patient informed of this and that rep stated she can call throughout the day to check on the progress of this

## 2016-05-22 ENCOUNTER — Inpatient Hospital Stay: Payer: Medicare Other | Attending: Internal Medicine | Admitting: Internal Medicine

## 2016-05-22 VITALS — BP 132/86 | HR 74 | Temp 98.0°F | Resp 20 | Ht 61.0 in | Wt 160.4 lb

## 2016-05-22 DIAGNOSIS — E785 Hyperlipidemia, unspecified: Secondary | ICD-10-CM | POA: Diagnosis not present

## 2016-05-22 DIAGNOSIS — F329 Major depressive disorder, single episode, unspecified: Secondary | ICD-10-CM | POA: Diagnosis not present

## 2016-05-22 DIAGNOSIS — E119 Type 2 diabetes mellitus without complications: Secondary | ICD-10-CM | POA: Insufficient documentation

## 2016-05-22 DIAGNOSIS — Z85850 Personal history of malignant neoplasm of thyroid: Secondary | ICD-10-CM | POA: Diagnosis not present

## 2016-05-22 DIAGNOSIS — R51 Headache: Secondary | ICD-10-CM | POA: Insufficient documentation

## 2016-05-22 DIAGNOSIS — G40909 Epilepsy, unspecified, not intractable, without status epilepticus: Secondary | ICD-10-CM | POA: Diagnosis not present

## 2016-05-22 DIAGNOSIS — C719 Malignant neoplasm of brain, unspecified: Secondary | ICD-10-CM

## 2016-05-22 DIAGNOSIS — Z79899 Other long term (current) drug therapy: Secondary | ICD-10-CM

## 2016-05-22 DIAGNOSIS — Z87891 Personal history of nicotine dependence: Secondary | ICD-10-CM | POA: Insufficient documentation

## 2016-05-22 DIAGNOSIS — I10 Essential (primary) hypertension: Secondary | ICD-10-CM | POA: Diagnosis not present

## 2016-05-22 DIAGNOSIS — C712 Malignant neoplasm of temporal lobe: Secondary | ICD-10-CM | POA: Insufficient documentation

## 2016-05-22 DIAGNOSIS — Z923 Personal history of irradiation: Secondary | ICD-10-CM | POA: Diagnosis not present

## 2016-05-22 NOTE — Progress Notes (Signed)
[facility] OFFICE PROGRESS NOTE  Patient Care Team: Carmon Ginsberg, [place] as PCP - General (Family Medicine)  No matching staging information was found for the patient.   Oncology History   1. 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 (January, 2017; s/p resection Dr.Nundkumar; [place]) 2 starting radiation therapy and Temodar January of 2017 3.She has finished radiation therapy in April of 2017 and also finished Temodar for approximately 6 weeks 4.Patient started on maintenance Temodar from Jan 03, 2016; July 2017- MRI no recurrence; left hemisphere- whitish plaques ? 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    #Thyroid cancer s/p Thyroidectomy [s/p RAUI; 1967]     Glioblastoma determined by biopsy of brain ([place])   08/27/2015 Initial Diagnosis    Glioblastoma determined by biopsy of brain ([place])         INTERVAL HISTORY:  Martha [name] 70 y.o.  female pleasant patient above history of GBM right frontal status post resection and currently on adjuvant Temodar- Is here for follow-up.  Patient has come off her steroids; did not notice headaches getting any worse.   However notice to have tremors of her bilateral upper extremities.  Otherwise denies any seizures. No new weaknesses. Denies any unusual chest pain or shortness of breath or cough.  She walks with a cane. In general appetite is good. No weight loss.   REVIEW OF SYSTEMS:  A complete 10 point review of system is done which is negative except mentioned above/history of present illness.   PAST MEDICAL HISTORY :  Past Medical History:  Diagnosis Date  . Cancer [facility])    thyroid cancer treated with radioactive iodine  . Depression   . Diabetes mellitus without complication ([place])   . Epilepsy ([place])   . Glioblastoma determined by biopsy of brain ([place])   . Hyperlipidemia   . Hypertension   . Thyroid disease    h/o  thyroid ca- 30 years ago  . Varicose vein     PAST SURGICAL HISTORY :   Past Surgical History:  Procedure Laterality Date  . APPLICATION OF CRANIAL NAVIGATION N/A 08/02/2015   Procedure: APPLICATION OF CRANIAL NAVIGATION;  Surgeon: Consuella Lose, MD;  Location: [place]UR[facility];  Service: Neurosurgery;  Laterality: N/A;  APPLICATION OF CRANIAL NAVIGATION  . BILATERAL CARPAL TUNNEL RELEASE  1990,1991   left hand in 1990 right hand in '91  . CERVICAL SPINE SURGERY  05/1997   spinal fusion  . CHOLECYSTECTOMY  04/24/2008   status post laparoscopic Dr.Ely  . CRANIOTOMY Right 08/02/2015   Procedure: CRANIOTOMY TUMOR EXCISION;  Surgeon: Consuella Lose, MD;  Location: [place] [facility]UR[facility] ORS;  Service: Neurosurgery;  Laterality: Right;  CRANIOTOMY TUMOR EXCISION  . radioactive iodine    . THYROIDECTOMY, PARTIAL  1964   due to cancerous tumor  . TOE SURGERY Left 1989    FAMILY HISTORY :   Family History  Problem Relation Age of Onset  . Arthritis Mother   . Hyperlipidemia Mother   . Hypertension Mother   . Heart disease Mother   . Diabetes Mother     type 2  . Thyroid disease Mother   . Cancer Father     colon  . Alcohol abuse Brother   . Hyperlipidemia Brother   . Hypertension Brother   . Breast cancer Neg Hx   . Ovarian cancer Neg Hx     SOCIAL HISTORY:   Social History  Substance Use Topics  . Smoking status: Former Research scientist (life sciences)  . Smokeless tobacco: Never Used     Comment: quit smoking in 2007  . Alcohol use No    ALLERGIES:  is allergic to ampicillin; fish-derived products; tomato; and penicillins.  MEDICATIONS:  Current Outpatient Prescriptions  Medication Sig Dispense Refill  . colchicine (COLCRYS) 0.6 MG tablet One pill ever 2-3 days for gout prevention 45 tablet 3  . levETIRAcetam (KEPPRA) 500 MG tablet Take 1 tablet (500 mg total) by mouth 2 (two) times daily. Reported on 12/11/2015    . levothyroxine (SYNTHROID, LEVOTHROID) 137 MCG tablet TAKE 1 TABLET BY MOUTH EVERY DAY  90 tablet 3  . lisinopril-hydrochlorothiazide (PRINZIDE,ZESTORETIC) 20-25 MG tablet Take 1 tablet by mouth daily. 30 tablet 3  . pravastatin (PRAVACHOL) 40 MG tablet TAKE 1 TABLET BY MOUTH EVERY DAY 90 tablet 1  . dexamethasone (DECADRON) 4 MG tablet Take 1 tablet (4 mg total) by mouth 3 (three) times daily. (Patient not taking: Reported on 05/22/2016) 30 tablet 0  . ibuprofen (ADVIL,MOTRIN) 200 MG tablet Take 200 mg by mouth every 6 (six) hours as needed.    . pantoprazole (PROTONIX) 20 MG tablet Take 1 tablet (20 mg total) by mouth daily. (Patient not taking: Reported on 05/22/2016) 30 tablet 0  . promethazine (PHENERGAN) 25 MG tablet TAKE 1 TABLET (25 MG TOTAL) BY MOUTH EVERY 6 (SIX) HOURS AS NEEDED FOR NAUSEA OR VOMITING. (Patient not taking: Reported on 05/22/2016) 30 tablet 0  . temozolomide (TEMODAR) 250 MG capsule Take 1 capsule by mouth for 5 days, repeat every 4 weeks. (Patient not taking: Reported on 05/22/2016) 5 capsule 3   No current facility-administered medications for this visit.     PHYSICAL EXAMINATION: ECOG PERFORMANCE STATUS: 2 - Symptomatic, <50% confined to bed  BP 132/86 (Patient Position: Sitting)   Pulse 74   Temp 98 F (36.7 C) (Tympanic)   Resp 20   Ht 5\' 1"  (1.549 m)   Wt 160 lb 6.4 oz (72.8 kg)   BMI 30.31 kg/m   Filed Weights   05/22/16 1500  Weight: 160 lb 6.4 oz (72.8 kg)    GENERAL: Well-nourished well-developed; Alert, no distress and comfortable.Walks with a cane. She is accompanied by her friend.  EYES: no pallor or icterus OROPHARYNX: no thrush or ulceration; good dentition  NECK: supple, no masses felt LYMPH:  no palpable lymphadenopathy in the cervical, axillary or inguinal regions LUNGS: clear to auscultation and  No wheeze or crackles HEART/CVS: regular rate & rhythm and no murmurs; No lower extremity edema ABDOMEN:abdomen soft, non-tender and normal bowel sounds Musculoskeletal:no cyanosis of digits and no clubbing  PSYCH: alert &  oriented x 3 with fluent speech NEURO: no focal motor/sensory deficits; fine tremors of bilateral upper extremities. SKIN:  no rashes or significant lesions  LABORATORY DATA:  I have reviewed the data as listed    Component Value Date/Time   NA 143 05/01/2016 1400   NA 148 (H) 11/14/2015 1623   NA 142 11/14/2014 1506   K 3.5 05/01/2016 1400   K 3.8 11/14/2014 1506   CL 107 05/01/2016 1400   CL 110 11/14/2014 1506   CO2 30 05/01/2016 1400   CO2 23 11/14/2014 1506   GLUCOSE 107 (H) 05/01/2016 1400   GLUCOSE 104 (H) 11/14/2014 1506   BUN 23 (H) 05/01/2016 1400   BUN 13 11/14/2015 1623   BUN 31 (H) 11/14/2014 1506   CREATININE 0.85 05/01/2016 1400   CREATININE 1.08 (H)  11/14/2014 1506   CALCIUM 9.2 05/01/2016 1400   CALCIUM 9.5 11/14/2014 1506   PROT 6.5 05/01/2016 1400   ALBUMIN 4.0 05/01/2016 1400   ALBUMIN 3.9 11/14/2015 1623   AST 20 05/01/2016 1400   ALT 30 05/01/2016 1400   ALKPHOS 84 05/01/2016 1400   BILITOT 0.7 05/01/2016 1400   GFRNONAA >60 05/01/2016 1400   GFRNONAA 53 (L) 11/14/2014 1506   GFRAA >60 05/01/2016 1400   GFRAA >60 11/14/2014 1506    No results found for: SPEP, UPEP  Lab Results  Component Value Date   WBC 7.2 05/01/2016   NEUTROABS 4.9 05/01/2016   HGB 13.1 05/01/2016   HCT 37.0 05/01/2016   MCV 87.3 05/01/2016   PLT 187 05/01/2016      Chemistry      Component Value Date/Time   NA 143 05/01/2016 1400   NA 148 (H) 11/14/2015 1623   NA 142 11/14/2014 1506   K 3.5 05/01/2016 1400   K 3.8 11/14/2014 1506   CL 107 05/01/2016 1400   CL 110 11/14/2014 1506   CO2 30 05/01/2016 1400   CO2 23 11/14/2014 1506   BUN 23 (H) 05/01/2016 1400   BUN 13 11/14/2015 1623   BUN 31 (H) 11/14/2014 1506   CREATININE 0.85 05/01/2016 1400   CREATININE 1.08 (H) 11/14/2014 1506      Component Value Date/Time   CALCIUM 9.2 05/01/2016 1400   CALCIUM 9.5 11/14/2014 1506   ALKPHOS 84 05/01/2016 1400   AST 20 05/01/2016 1400   ALT 30 05/01/2016 1400    BILITOT 0.7 05/01/2016 1400       RADIOGRAPHIC STUDIES: I have personally reviewed the radiological images as listed and agreed with the findings in the report. No results found.   ASSESSMENT & PLAN:  Glioblastoma determined by biopsy of brain ([place]) Status post resection followed by concurrent chemoradiation- status post cycle # 4 of adjuvant therapy. SEP 2017- MRI shows no definite evidence of recurrence in the resection cavity; slight enhancement noted around the cavity.  # Recommend continuing adjuvant Temodar;  Teomodar pills- restart now.   # Headaches-MRI brain- recommend taper dex over 1 week. Call us if headaches start to get worse. Discussed with Dr.Nundkumar- no obvious evidence of progression.   # Elevated Blood pressure-lisinopril- hctz one day; improved.   # Bilateral fine tremors- sec to hyperthyroidism [TSH in August 2017 through PCP-low]. Recommend calling PCP regarding thyroid levels checked/follow-up.  # follow up in 4 weeks.    Orders Placed This Encounter  Procedures  . CBC with Differential    Standing Status:   Future    Standing Expiration Date:   05/22/2017  . Comprehensive metabolic panel    Standing Status:   Future    Standing Expiration Date:   05/22/2017   All questions were answered. The patient knows to call the clinic with any problems, questions or concerns.      Cammie Sickle, MD 05/23/2016 12:41 PM

## 2016-05-22 NOTE — Assessment & Plan Note (Addendum)
Status post resection followed by concurrent chemoradiation- status post cycle # 4 of adjuvant therapy. SEP 2017- MRI shows no definite evidence of recurrence in the resection cavity; slight enhancement noted around the cavity.  # Recommend continuing adjuvant Temodar;  Teomodar pills- restart now.   # Headaches-status post dexamethasone taper. Call us if headaches start to get worse. Discussed with Dr.Nundkumar- no obvious evidence of progression. However if headaches continue to persist- then trial off Avastin would be Reasonable  # Elevated Blood pressure-lisinopril- hctz one day; improved.   # History of thyroid cancer - on Synthroid; TSH suppression. However given the tremors/recommend Adjusting Synthroid to normal TSH levels.   # Bilateral fine tremors- sec to hyperthyroidism [TSH in August 2017 through PCP-low]. Recommend calling PCP regarding thyroid levels checked/follow-up.  # follow up in 4 weeks.

## 2016-05-25 ENCOUNTER — Other Ambulatory Visit: Payer: Self-pay | Admitting: Family Medicine

## 2016-05-25 ENCOUNTER — Telehealth: Payer: Self-pay

## 2016-05-25 MED ORDER — LEVOTHYROXINE SODIUM 125 MCG PO TABS: 125.0000 ug | ORAL_TABLET | Freq: Every day | ORAL | 1 refills | Status: DC

## 2016-05-25 NOTE — Telephone Encounter (Signed)
[id number] 

## 2016-05-25 NOTE — Telephone Encounter (Signed)
-----   Message from Carmon Ginsberg, [place] sent at 05/25/2016  7:53 AM EDT ----- Regarding: change in thyroid medication Due to tremors we have decreased dose of Levothyroxine and sent it in to [facility]. Would like to recheck labs in 6-8 weeks if not done by your oncologist.

## 2016-05-26 NOTE — Telephone Encounter (Signed)
[id number] 

## 2016-05-28 NOTE — Telephone Encounter (Signed)
Patient has been advised. KW 

## 2016-06-08 ENCOUNTER — Ambulatory Visit: Payer: Self-pay | Admitting: Family Medicine

## 2016-06-08 ENCOUNTER — Encounter: Payer: Self-pay | Admitting: Family Medicine

## 2016-06-08 ENCOUNTER — Ambulatory Visit (INDEPENDENT_AMBULATORY_CARE_PROVIDER_SITE_OTHER): Payer: Medicare Other | Admitting: Family Medicine

## 2016-06-08 ENCOUNTER — Telehealth: Payer: Self-pay

## 2016-06-08 VITALS — BP 124/74 | HR 92 | Temp 97.7°F | Resp 16

## 2016-06-08 DIAGNOSIS — I70209 Unspecified atherosclerosis of native arteries of extremities, unspecified extremity: Secondary | ICD-10-CM | POA: Diagnosis not present

## 2016-06-08 DIAGNOSIS — E1151 Type 2 diabetes mellitus with diabetic peripheral angiopathy without gangrene: Secondary | ICD-10-CM | POA: Diagnosis not present

## 2016-06-08 DIAGNOSIS — B029 Zoster without complications: Secondary | ICD-10-CM | POA: Diagnosis not present

## 2016-06-08 MED ORDER — NORTRIPTYLINE HCL 10 MG PO CAPS: ORAL_CAPSULE | ORAL | 0 refills | Status: DC

## 2016-06-08 MED ORDER — VALACYCLOVIR HCL 1 G PO TABS: 1000.0000 mg | ORAL_TABLET | Freq: Three times a day (TID) | ORAL | 0 refills | Status: DC

## 2016-06-08 NOTE — Telephone Encounter (Signed)
FYI: patient will be in this afternoon. KW

## 2016-06-08 NOTE — Progress Notes (Signed)
Subjective:     Patient ID: Martha [name], female   DOB: [birth year], 70 y.o.   MRN: [id number]  HPI  Chief Complaint  Patient presents with  . Rash    Patient comes in office today with cocnerns of rash that appeared on the right side of her neck 3 days ago. Patient states that rash appears to be blistered and skin has been weeping.. Patient describes rash as painful and itchy, she has been taking otc Ibuprofen PRN.   She has ongoing chemo rx for a glioblastoma. + chicken pox in childhood. States itching and burning have been keeping her up at night. Accompanied by a friend, [name], today.   Review of Systems     Objective:   Physical Exam  Constitutional: She appears well-developed and well-nourished. No distress.  Skin:  Patches of rash with overlying vesicles/pustules on her right upper back and upper chest which do not cross midline.       Assessment:    1. Herpes zoster without complicatio - valACYclovir (VALTREX) 1000 MG tablet; Take 1 tablet (1,000 mg total) by mouth 3 (three) times daily.  Dispense: 21 tablet; Refill: 0 - nortriptyline (PAMELOR) 10 MG capsule; One to three at bedtime to help with sleep and nerve pain  Dispense: 30 capsule; Refill: 0    Plan:    Discussed minimizing contact with children until crusted over. May wish let her oncologist know as well. F/u in late November/early December for diabetes and her thyroid.

## 2016-06-08 NOTE — Patient Instructions (Addendum)
Stay away from children until your rash is crusted over. Let your oncology doctor know that you have this rash. He can read my note in the electronic record. Try the nortriptyline at bedtime to help sleep and nerve pain.

## 2016-06-08 NOTE — Telephone Encounter (Signed)
Patient reports rash on right shoulder only x's 3 days. Patient reports rash was painful first night and has improved since then. Pt scheduled for 2:45 pm today

## 2016-06-08 NOTE — Telephone Encounter (Signed)
Calling to inform Dr. Rogue Bussing that the patient has been diagnosed with Shingles.  Dr. B has reviewed office note from PCP and recommends patient to not take Temodar until f/u with him on 06/19/16.  Called patient and she had finished her 5 day round of Temodar on Friday (06/05/16) and will f/u up with Dr. Jacinto Reap on 06/19/16

## 2016-06-09 ENCOUNTER — Telehealth: Payer: Self-pay | Admitting: Family Medicine

## 2016-06-09 ENCOUNTER — Other Ambulatory Visit: Payer: Self-pay | Admitting: Family Medicine

## 2016-06-09 DIAGNOSIS — B029 Zoster without complications: Secondary | ICD-10-CM

## 2016-06-09 MED ORDER — HYDROCODONE-ACETAMINOPHEN 5-325 MG PO TABS: ORAL_TABLET | ORAL | 0 refills | Status: DC

## 2016-06-09 NOTE — Telephone Encounter (Signed)
I have placed a pain medication rx for hydrocodone up front for pickup. I still want her to use the nortriptyline at bedtime as it will help as well.

## 2016-06-09 NOTE — Telephone Encounter (Signed)
Pt was in yesterday and seen Mikki Santee for shingles.  She said she is in a lot of pain today.  She wants to know if something can be sent to the pharmacy.  [facility]    Pt call back is [phone]  Thanks Con Memos

## 2016-06-10 NOTE — Telephone Encounter (Signed)
[id number]-KW 

## 2016-06-11 ENCOUNTER — Telehealth: Payer: Self-pay | Admitting: Family Medicine

## 2016-06-11 ENCOUNTER — Other Ambulatory Visit: Payer: Self-pay | Admitting: Family Medicine

## 2016-06-11 DIAGNOSIS — B029 Zoster without complications: Secondary | ICD-10-CM

## 2016-06-11 MED ORDER — NORTRIPTYLINE HCL 50 MG PO CAPS: 50.0000 mg | ORAL_CAPSULE | Freq: Every day | ORAL | 0 refills | Status: DC

## 2016-06-11 NOTE — Telephone Encounter (Signed)
Pt states she was seen this week for shingles.  Pt states she is having pain in her right ear and right side. Pt is requesting a Rx to help with this.  [facility].  [id number]

## 2016-06-11 NOTE — Telephone Encounter (Signed)
I have changed nortriptyline to 50 mg.at bedtime as sent it in to the pharmacy.

## 2016-06-11 NOTE — Telephone Encounter (Signed)
Patient has been advised. KW 

## 2016-06-11 NOTE — Telephone Encounter (Signed)
[id number] 

## 2016-06-11 NOTE — Telephone Encounter (Signed)
Patient states that she has been taking her medication but nothing has helped for pain in her right ear or side. I advised patient that she did have a prescription for Hydrocodone that is still sitting at front desk. Please advise if patient should be taking anything else for pain? KW

## 2016-06-12 NOTE — Telephone Encounter (Signed)
Patient has been advised. KW 

## 2016-06-19 ENCOUNTER — Inpatient Hospital Stay: Payer: Medicare Other | Attending: Internal Medicine

## 2016-06-19 ENCOUNTER — Inpatient Hospital Stay (HOSPITAL_BASED_OUTPATIENT_CLINIC_OR_DEPARTMENT_OTHER): Payer: Medicare Other | Admitting: Internal Medicine

## 2016-06-19 VITALS — BP 87/66 | HR 103 | Temp 97.0°F | Resp 18 | Wt 151.2 lb

## 2016-06-19 DIAGNOSIS — Z79899 Other long term (current) drug therapy: Secondary | ICD-10-CM | POA: Diagnosis not present

## 2016-06-19 DIAGNOSIS — C719 Malignant neoplasm of brain, unspecified: Secondary | ICD-10-CM

## 2016-06-19 DIAGNOSIS — G40909 Epilepsy, unspecified, not intractable, without status epilepticus: Secondary | ICD-10-CM | POA: Insufficient documentation

## 2016-06-19 DIAGNOSIS — I10 Essential (primary) hypertension: Secondary | ICD-10-CM | POA: Diagnosis not present

## 2016-06-19 DIAGNOSIS — Z87891 Personal history of nicotine dependence: Secondary | ICD-10-CM | POA: Insufficient documentation

## 2016-06-19 DIAGNOSIS — E119 Type 2 diabetes mellitus without complications: Secondary | ICD-10-CM | POA: Insufficient documentation

## 2016-06-19 DIAGNOSIS — B029 Zoster without complications: Secondary | ICD-10-CM | POA: Diagnosis not present

## 2016-06-19 DIAGNOSIS — F329 Major depressive disorder, single episode, unspecified: Secondary | ICD-10-CM | POA: Insufficient documentation

## 2016-06-19 DIAGNOSIS — Z85850 Personal history of malignant neoplasm of thyroid: Secondary | ICD-10-CM | POA: Insufficient documentation

## 2016-06-19 DIAGNOSIS — E785 Hyperlipidemia, unspecified: Secondary | ICD-10-CM | POA: Diagnosis not present

## 2016-06-19 DIAGNOSIS — N179 Acute kidney failure, unspecified: Secondary | ICD-10-CM

## 2016-06-19 DIAGNOSIS — Z923 Personal history of irradiation: Secondary | ICD-10-CM

## 2016-06-19 DIAGNOSIS — C172 Malignant neoplasm of ileum: Secondary | ICD-10-CM | POA: Diagnosis not present

## 2016-06-19 DIAGNOSIS — C712 Malignant neoplasm of temporal lobe: Secondary | ICD-10-CM | POA: Diagnosis present

## 2016-06-19 LAB — COMPREHENSIVE METABOLIC PANEL
ALBUMIN: 4.6 g/dL (ref 3.5–5.0)
ALT: 39 U/L (ref 14–54)
AST: 28 U/L (ref 15–41)
Alkaline Phosphatase: 93 U/L (ref 38–126)
Anion gap: 12 (ref 5–15)
BILIRUBIN TOTAL: 1.4 mg/dL — AB (ref 0.3–1.2)
BUN: 41 mg/dL — AB (ref 6–20)
CO2: 26 mmol/L (ref 22–32)
CREATININE: 2.14 mg/dL — AB (ref 0.44–1.00)
Calcium: 9.6 mg/dL (ref 8.9–10.3)
Chloride: 100 mmol/L — ABNORMAL LOW (ref 101–111)
GFR calc Af Amer: 26 mL/min — ABNORMAL LOW (ref >60)
GFR, EST NON AFRICAN AMERICAN: 22 mL/min — AB (ref >60)
GLUCOSE: 105 mg/dL — AB (ref 65–99)
POTASSIUM: 3.2 mmol/L — AB (ref 3.5–5.1)
Sodium: 138 mmol/L (ref 135–145)
TOTAL PROTEIN: 7.4 g/dL (ref 6.5–8.1)

## 2016-06-19 LAB — CBC WITH DIFFERENTIAL/PLATELET
BASOS ABS: 0.1 10*3/uL (ref 0–0.1)
BASOS PCT: 2 %
Eosinophils Absolute: 0.1 10*3/uL (ref 0–0.7)
Eosinophils Relative: 1 %
HEMATOCRIT: 42.1 % (ref 35.0–47.0)
HEMOGLOBIN: 14.6 g/dL (ref 12.0–16.0)
LYMPHS PCT: 19 %
Lymphs Abs: 1.8 10*3/uL (ref 1.0–3.6)
MCH: 31.1 pg (ref 26.0–34.0)
MCHC: 34.8 g/dL (ref 32.0–36.0)
MCV: 89.3 fL (ref 80.0–100.0)
Monocytes Absolute: 0.6 10*3/uL (ref 0.2–0.9)
Monocytes Relative: 7 %
NEUTROS ABS: 6.6 10*3/uL — AB (ref 1.4–6.5)
NEUTROS PCT: 71 %
Platelets: 281 10*3/uL (ref 150–440)
RBC: 4.71 MIL/uL (ref 3.80–5.20)
RDW: 13.3 % (ref 11.5–14.5)
WBC: 9.3 10*3/uL (ref 3.6–11.0)

## 2016-06-19 NOTE — Progress Notes (Signed)
[facility] OFFICE PROGRESS NOTE  Patient Care Team: Carmon Ginsberg, [place] as PCP - General (Family Medicine)  No matching staging information was found for the patient.   Oncology History   1. 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 (January, 2017; s/p resection Dr.Nundkumar; [place]) 2 starting radiation therapy and Temodar January of 2017 3.She has finished radiation therapy in April of 2017 and also finished Temodar for approximately 6 weeks 4.Patient started on maintenance Temodar from Jan 03, 2016; July 2017- MRI no recurrence; left hemisphere- whitish plaques ? 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    #Thyroid cancer s/p Thyroidectomy [s/p RAUI; 1967]     Glioblastoma determined by biopsy of brain ([place])   08/27/2015 Initial Diagnosis    Glioblastoma determined by biopsy of brain ([place])         INTERVAL HISTORY:  Martha [name] 70 y.o.  female pleasant patient above history of GBM right frontal status post resection and currently on adjuvant Temodar- Is here for follow-up.  In the interim patient was diagnosed with shingles; on valacyclovir. This is improving. Denies any significant pain She is currently off steroids. Patient admits to not drinking enough fluids. Poor appetite. She denies any dizziness or syncopal episodes. She has been taking 1-2 motrin is every other day.   Did not notice any worsening headaches. She is chronic mild headache at the site of the incision.   Otherwise denies any seizures. No new weaknesses. Denies any unusual chest pain or shortness of breath or cough.  She walks with a cane. In general appetite is good. No weight loss.   REVIEW OF SYSTEMS:  A complete 10 point review of system is done which is negative except mentioned above/history of present illness.   PAST MEDICAL HISTORY :  Past Medical History:  Diagnosis Date  . Cancer  [facility])    thyroid cancer treated with radioactive iodine  . Depression   . Diabetes mellitus without complication ([place])   . Epilepsy ([place])   . Glioblastoma determined by biopsy of brain ([place])   . Hyperlipidemia   . Hypertension   . Thyroid disease    h/o thyroid ca- 30 years ago  . Varicose vein     PAST SURGICAL HISTORY :   Past Surgical History:  Procedure Laterality Date  . APPLICATION OF CRANIAL NAVIGATION N/A 08/02/2015   Procedure: APPLICATION OF CRANIAL NAVIGATION;  Surgeon: Consuella Lose, MD;  Location: [place] [facility];  Service: Neurosurgery;  Laterality: N/A;  APPLICATION OF CRANIAL NAVIGATION  . BILATERAL CARPAL TUNNEL RELEASE  1990,1991   left hand in 1990 right hand in '91  . CERVICAL SPINE SURGERY  05/1997   spinal fusion  . CHOLECYSTECTOMY  04/24/2008   status post laparoscopic Dr.Ely  . CRANIOTOMY Right 08/02/2015   Procedure: CRANIOTOMY TUMOR EXCISION;  Surgeon: Consuella Lose, MD;  Location: [place] [facility];  Service: Neurosurgery;  Laterality: Right;  CRANIOTOMY TUMOR EXCISION  . radioactive iodine    . THYROIDECTOMY, PARTIAL  1964   due to cancerous tumor  . TOE SURGERY Left 1989    FAMILY HISTORY :   Family History  Problem Relation Age of Onset  . Arthritis Mother   . Hyperlipidemia Mother   . Hypertension Mother   . Heart disease Mother   . Diabetes Mother     type 2  . Thyroid disease Mother   . Cancer Father  colon  . Alcohol abuse Brother   . Hyperlipidemia Brother   . Hypertension Brother   . Breast cancer Neg Hx   . Ovarian cancer Neg Hx     SOCIAL HISTORY:   Social History  Substance Use Topics  . Smoking status: Former Research scientist (life sciences)  . Smokeless tobacco: Never Used     Comment: quit smoking in 2007  . Alcohol use No    ALLERGIES:  is allergic to ampicillin; fish-derived products; tomato; and penicillins.  MEDICATIONS:  Current Outpatient Prescriptions  Medication Sig Dispense Refill  . colchicine (COLCRYS) 0.6 MG tablet One  pill ever 2-3 days for gout prevention 45 tablet 3  . dexamethasone (DECADRON) 4 MG tablet Take 1 tablet (4 mg total) by mouth 3 (three) times daily. 30 tablet 0  . HYDROcodone-acetaminophen (NORCO/VICODIN) 5-325 MG tablet One every 4-6 hours as needed for pain 28 tablet 0  . ibuprofen (ADVIL,MOTRIN) 200 MG tablet Take 200 mg by mouth every 6 (six) hours as needed.    . levETIRAcetam (KEPPRA) 500 MG tablet Take 1 tablet (500 mg total) by mouth 2 (two) times daily. Reported on 12/11/2015    . levothyroxine (SYNTHROID) 125 MCG tablet Take 1 tablet (125 mcg total) by mouth daily before breakfast. 90 tablet 1  . lisinopril-hydrochlorothiazide (PRINZIDE,ZESTORETIC) 20-25 MG tablet Take 1 tablet by mouth daily. 30 tablet 3  . nortriptyline (PAMELOR) 10 MG capsule TAKE ONE TO THREE CAPSULES AT BEDTIME TO HELP WITH SLEEP AND NERVE PAIN  0  . pantoprazole (PROTONIX) 20 MG tablet Take 1 tablet (20 mg total) by mouth daily. 30 tablet 0  . pravastatin (PRAVACHOL) 40 MG tablet TAKE 1 TABLET BY MOUTH EVERY DAY 90 tablet 1  . promethazine (PHENERGAN) 25 MG tablet TAKE 1 TABLET (25 MG TOTAL) BY MOUTH EVERY 6 (SIX) HOURS AS NEEDED FOR NAUSEA OR VOMITING. 30 tablet 0  . temozolomide (TEMODAR) 250 MG capsule Take 1 capsule by mouth for 5 days, repeat every 4 weeks. 5 capsule 3  . valACYclovir (VALTREX) 1000 MG tablet Take 1 tablet (1,000 mg total) by mouth 3 (three) times daily. 21 tablet 0  . nortriptyline (PAMELOR) 50 MG capsule Take 1 capsule (50 mg total) by mouth at bedtime. (Patient not taking: Reported on 06/19/2016) 30 capsule 0   No current facility-administered medications for this visit.     PHYSICAL EXAMINATION: ECOG PERFORMANCE STATUS: 2 - Symptomatic, <50% confined to bed  BP (!) 87/66 (BP Location: Left Arm, Patient Position: Sitting)   Pulse (!) 103   Temp 97 F (36.1 C) (Tympanic)   Resp 18   Wt 151 lb 3.2 oz (68.6 kg)   SpO2 97%   BMI 28.57 kg/m   Filed Weights   06/19/16 1508  Weight:  151 lb 3.2 oz (68.6 kg)    GENERAL: Well-nourished well-developed; Alert, no distress and comfortable.Walks with a cane. She is accompanied by her friend.  EYES: no pallor or icterus OROPHARYNX: no thrush or ulceration; good dentition  NECK: supple, no masses felt LYMPH:  no palpable lymphadenopathy in the cervical, axillary or inguinal regions LUNGS: clear to auscultation and  No wheeze or crackles HEART/CVS: regular rate & rhythm and no murmurs; No lower extremity edema ABDOMEN:abdomen soft, non-tender and normal bowel sounds Musculoskeletal:no cyanosis of digits and no clubbing  PSYCH: alert & oriented x 3 with fluent speech NEURO: no focal motor/sensory deficits; fine tremors of bilateral upper extremities. SKIN:  no rashes or significant lesions; scabbing rash noted around the  shoulder and the right chest wall. No pus noted  LABORATORY DATA:  I have reviewed the data as listed    Component Value Date/Time   NA 138 06/19/2016 1449   NA 148 (H) 11/14/2015 1623   NA 142 11/14/2014 1506   K 3.2 (L) 06/19/2016 1449   K 3.8 11/14/2014 1506   CL 100 (L) 06/19/2016 1449   CL 110 11/14/2014 1506   CO2 26 06/19/2016 1449   CO2 23 11/14/2014 1506   GLUCOSE 105 (H) 06/19/2016 1449   GLUCOSE 104 (H) 11/14/2014 1506   BUN 41 (H) 06/19/2016 1449   BUN 13 11/14/2015 1623   BUN 31 (H) 11/14/2014 1506   CREATININE 2.14 (H) 06/19/2016 1449   CREATININE 1.08 (H) 11/14/2014 1506   CALCIUM 9.6 06/19/2016 1449   CALCIUM 9.5 11/14/2014 1506   PROT 7.4 06/19/2016 1449   ALBUMIN 4.6 06/19/2016 1449   ALBUMIN 3.9 11/14/2015 1623   AST 28 06/19/2016 1449   ALT 39 06/19/2016 1449   ALKPHOS 93 06/19/2016 1449   BILITOT 1.4 (H) 06/19/2016 1449   GFRNONAA 22 (L) 06/19/2016 1449   GFRNONAA 53 (L) 11/14/2014 1506   GFRAA 26 (L) 06/19/2016 1449   GFRAA >60 11/14/2014 1506    No results found for: SPEP, UPEP  Lab Results  Component Value Date   WBC 9.3 06/19/2016   NEUTROABS 6.6 (H)  06/19/2016   HGB 14.6 06/19/2016   HCT 42.1 06/19/2016   MCV 89.3 06/19/2016   PLT 281 06/19/2016      Chemistry      Component Value Date/Time   NA 138 06/19/2016 1449   NA 148 (H) 11/14/2015 1623   NA 142 11/14/2014 1506   K 3.2 (L) 06/19/2016 1449   K 3.8 11/14/2014 1506   CL 100 (L) 06/19/2016 1449   CL 110 11/14/2014 1506   CO2 26 06/19/2016 1449   CO2 23 11/14/2014 1506   BUN 41 (H) 06/19/2016 1449   BUN 13 11/14/2015 1623   BUN 31 (H) 11/14/2014 1506   CREATININE 2.14 (H) 06/19/2016 1449   CREATININE 1.08 (H) 11/14/2014 1506      Component Value Date/Time   CALCIUM 9.6 06/19/2016 1449   CALCIUM 9.5 11/14/2014 1506   ALKPHOS 93 06/19/2016 1449   AST 28 06/19/2016 1449   ALT 39 06/19/2016 1449   BILITOT 1.4 (H) 06/19/2016 1449       RADIOGRAPHIC STUDIES: I have personally reviewed the radiological images as listed and agreed with the findings in the report. No results found.   ASSESSMENT & PLAN:  Glioblastoma determined by biopsy of brain ([place]) Status post resection followed by concurrent chemoradiation- status post cycle # 5  of adjuvant therapy. SEP 2017- MRI shows no definite evidence of recurrence in the resection cavity; slight enhancement noted around the cavity.  # Recommend continuing adjuvant Temodar; but HOLD today's/last dose because of acute renal failure.   # acute renal failure/hypotension- creatinine- 2.14; base line 0.8. recommend Holding BP medication; increase fluids.   # Shingles- improving on valacyclovir.   # History of thyroid cancer - on Synthroid; TSH suppression. However given the tremors/recommend Adjusting Synthroid to normal TSH levels.   # follow up in 4 weeks. Reva Bores; not to start temodar until seen by me. - bmp in 1 week.    Orders Placed This Encounter  Procedures  . Basic metabolic panel    Standing Status:   Future    Standing Expiration Date:  06/19/2017  . CBC with Differential    Standing Status:   Future     Standing Expiration Date:   06/19/2017  . Comprehensive metabolic panel    Standing Status:   Future    Standing Expiration Date:   06/19/2017      Cammie Sickle, MD 06/19/2016 4:28 PM

## 2016-06-19 NOTE — Assessment & Plan Note (Addendum)
Status post resection followed by concurrent chemoradiation- status post cycle # 5  of adjuvant therapy. SEP 2017- MRI shows no definite evidence of recurrence in the resection cavity; slight enhancement noted around the cavity.  # Recommend continuing adjuvant Temodar; but HOLD today's/last dose because of acute renal failure.   # acute renal failure/hypotension- creatinine- 2.14; base line 0.8. recommend Holding BP medication; increase fluids.   # Shingles- improving on valacyclovir.   # History of thyroid cancer - on Synthroid; TSH suppression. However given the tremors/recommend Adjusting Synthroid to normal TSH levels.   # follow up in 4 weeks. Reva Bores; not to start temodar until seen by me. - bmp in 1 week.

## 2016-06-19 NOTE — Progress Notes (Signed)
Patient is here for follow up, she recently had shingles but it is resolving. She has not really been eating she is drinking broths and liquids nothing solid.

## 2016-06-26 ENCOUNTER — Other Ambulatory Visit: Payer: Self-pay

## 2016-06-26 ENCOUNTER — Telehealth: Payer: Self-pay

## 2016-06-26 ENCOUNTER — Inpatient Hospital Stay: Payer: Medicare Other

## 2016-06-26 DIAGNOSIS — B029 Zoster without complications: Secondary | ICD-10-CM | POA: Diagnosis not present

## 2016-06-26 DIAGNOSIS — Z923 Personal history of irradiation: Secondary | ICD-10-CM | POA: Diagnosis not present

## 2016-06-26 DIAGNOSIS — Z85850 Personal history of malignant neoplasm of thyroid: Secondary | ICD-10-CM | POA: Diagnosis not present

## 2016-06-26 DIAGNOSIS — C719 Malignant neoplasm of brain, unspecified: Secondary | ICD-10-CM

## 2016-06-26 DIAGNOSIS — N179 Acute kidney failure, unspecified: Secondary | ICD-10-CM | POA: Diagnosis not present

## 2016-06-26 DIAGNOSIS — Z79899 Other long term (current) drug therapy: Secondary | ICD-10-CM | POA: Diagnosis not present

## 2016-06-26 DIAGNOSIS — C172 Malignant neoplasm of ileum: Secondary | ICD-10-CM | POA: Diagnosis not present

## 2016-06-26 LAB — BASIC METABOLIC PANEL
ANION GAP: 10 (ref 5–15)
BUN: 27 mg/dL — ABNORMAL HIGH (ref 6–20)
CO2: 27 mmol/L (ref 22–32)
Calcium: 9.7 mg/dL (ref 8.9–10.3)
Chloride: 101 mmol/L (ref 101–111)
Creatinine, Ser: 1.29 mg/dL — ABNORMAL HIGH (ref 0.44–1.00)
GFR, EST AFRICAN AMERICAN: 47 mL/min — AB (ref >60)
GFR, EST NON AFRICAN AMERICAN: 41 mL/min — AB (ref >60)
GLUCOSE: 125 mg/dL — AB (ref 65–99)
POTASSIUM: 3.4 mmol/L — AB (ref 3.5–5.1)
SODIUM: 138 mmol/L (ref 135–145)

## 2016-06-26 NOTE — Telephone Encounter (Signed)
Called [name] and let her know when to come in on Tuesday for lab work, advised message about blood work, voiced and verbalized understanding.

## 2016-06-26 NOTE — Telephone Encounter (Signed)
-----   Message from Cammie Sickle, MD sent at 06/26/2016  3:37 PM EST ----- Please inform patient that her kidney numbers are improved; however not completely back to baseline; continue to hold her blood pressure medication; continued increase fluids repeat BMP next Tuesday.please order. Dr.B

## 2016-07-06 ENCOUNTER — Encounter: Payer: Self-pay | Admitting: Family Medicine

## 2016-07-06 ENCOUNTER — Ambulatory Visit (INDEPENDENT_AMBULATORY_CARE_PROVIDER_SITE_OTHER): Payer: Medicare Other | Admitting: Family Medicine

## 2016-07-06 VITALS — BP 100/68 | HR 68 | Temp 97.6°F | Resp 16 | Wt 153.0 lb

## 2016-07-06 DIAGNOSIS — Z23 Encounter for immunization: Secondary | ICD-10-CM | POA: Diagnosis not present

## 2016-07-06 DIAGNOSIS — C719 Malignant neoplasm of brain, unspecified: Secondary | ICD-10-CM

## 2016-07-06 DIAGNOSIS — N183 Chronic kidney disease, stage 3 unspecified: Secondary | ICD-10-CM | POA: Insufficient documentation

## 2016-07-06 DIAGNOSIS — E1151 Type 2 diabetes mellitus with diabetic peripheral angiopathy without gangrene: Secondary | ICD-10-CM | POA: Diagnosis not present

## 2016-07-06 DIAGNOSIS — I70209 Unspecified atherosclerosis of native arteries of extremities, unspecified extremity: Secondary | ICD-10-CM | POA: Diagnosis not present

## 2016-07-06 DIAGNOSIS — E039 Hypothyroidism, unspecified: Secondary | ICD-10-CM

## 2016-07-06 DIAGNOSIS — E1122 Type 2 diabetes mellitus with diabetic chronic kidney disease: Secondary | ICD-10-CM | POA: Diagnosis not present

## 2016-07-06 LAB — POCT GLYCOSYLATED HEMOGLOBIN (HGB A1C): HEMOGLOBIN A1C: 7.5

## 2016-07-06 MED ORDER — GLIPIZIDE 5 MG PO TABS: 5.0000 mg | ORAL_TABLET | Freq: Two times a day (BID) | ORAL | 2 refills | Status: DC

## 2016-07-06 NOTE — Patient Instructions (Signed)
We will call you with the lab results. 

## 2016-07-06 NOTE — Progress Notes (Signed)
Subjective:     Patient ID: Martha [name], female   DOB: 1946/04/13, 70 y.o.   MRN: [id number]  HPI  Chief Complaint  Patient presents with  . Hyperthyroidism    LOV 04/06/2016. TSH was 0.047 and T4 was 1.78. Pt has a H/O thyroid cancer and thyroidectomy.   . Hypertension    Per Dr. Jacinto Reap., pt was to hold BP medication due to decreased kidney function. BP today is 100/68.  Marland Kitchen Glioblastoma    F/B [place].  . Diabetes    Last A1C was 03/25/2016 and was 6.8%.  States tremors are nearly resolved on less levothyroxine. States she ate with abandon over Thanksgiving-currently diabetes is diet controlled. Report shingles resolved except for residual areas of  post inflammatory hyperpigmentation. Accompanied by her friend, [name]. Oncology f/u due in early December.   Review of Systems  Respiratory: Negative for shortness of breath.   Cardiovascular: Negative for chest pain and palpitations.       Objective:   Physical Exam  Constitutional: She appears well-developed and well-nourished. No distress.  Lungs: clear Heart: RRR without murmur Lower extremities: no edema; pedal pulses intact, sensation to monofilament intact, no wounds noted.     Assessment:    1. Type 2 diabetes mellitus with stage 3 chronic kidney disease, without long-term current use of insulin (HCC) - POCT glycosylated hemoglobin (Hb A1C) - glipiZIDE (GLUCOTROL) 5 MG tablet; Take 1 tablet (5 mg total) by mouth 2 (two) times daily before a meal.  Dispense: 60 tablet; Refill: 2  2. Adult hypothyroidism - T4, free - TSH  3. Glioblastoma determined by biopsy of brain [facility]): per oncology  4. CKD (chronic kidney disease) stage 3, GFR 30-59 ml/min  5. Need for influenza vaccination - Flu vaccine HIGH DOSE PF    Plan:    Resume oral medication for diabetes. Further f/u pending lab results.

## 2016-07-07 ENCOUNTER — Other Ambulatory Visit: Payer: Self-pay | Admitting: Family Medicine

## 2016-07-07 ENCOUNTER — Telehealth: Payer: Self-pay

## 2016-07-07 DIAGNOSIS — E890 Postprocedural hypothyroidism: Secondary | ICD-10-CM

## 2016-07-07 LAB — T4, FREE: Free T4: 2.05 ng/dL — ABNORMAL HIGH (ref 0.82–1.77)

## 2016-07-07 LAB — TSH: TSH: 0.145 u[IU]/mL — AB (ref 0.450–4.500)

## 2016-07-07 MED ORDER — LEVOTHYROXINE SODIUM 112 MCG PO TABS: 112.0000 ug | ORAL_TABLET | Freq: Every day | ORAL | 3 refills | Status: DC

## 2016-07-07 NOTE — Telephone Encounter (Signed)
-----   Message from Carmon Ginsberg, [place] sent at 07/07/2016  7:11 AM EST ----- Thyroid dose is still too high. Will send in lower dose and we can recheck at your next visit

## 2016-07-07 NOTE — Telephone Encounter (Signed)
Advised pt of lab results. Pt verbally acknowledges understanding. Emily Drozdowski, CMA   

## 2016-07-17 ENCOUNTER — Inpatient Hospital Stay: Payer: Medicare Other

## 2016-07-17 ENCOUNTER — Inpatient Hospital Stay: Payer: Medicare Other | Attending: Internal Medicine | Admitting: Internal Medicine

## 2016-07-17 VITALS — BP 100/72 | HR 103 | Temp 98.7°F | Resp 18 | Wt 152.4 lb

## 2016-07-17 DIAGNOSIS — E119 Type 2 diabetes mellitus without complications: Secondary | ICD-10-CM | POA: Diagnosis not present

## 2016-07-17 DIAGNOSIS — E876 Hypokalemia: Secondary | ICD-10-CM | POA: Diagnosis not present

## 2016-07-17 DIAGNOSIS — E785 Hyperlipidemia, unspecified: Secondary | ICD-10-CM | POA: Diagnosis not present

## 2016-07-17 DIAGNOSIS — C712 Malignant neoplasm of temporal lobe: Secondary | ICD-10-CM | POA: Diagnosis not present

## 2016-07-17 DIAGNOSIS — G40909 Epilepsy, unspecified, not intractable, without status epilepticus: Secondary | ICD-10-CM | POA: Insufficient documentation

## 2016-07-17 DIAGNOSIS — F329 Major depressive disorder, single episode, unspecified: Secondary | ICD-10-CM | POA: Insufficient documentation

## 2016-07-17 DIAGNOSIS — Z87891 Personal history of nicotine dependence: Secondary | ICD-10-CM | POA: Insufficient documentation

## 2016-07-17 DIAGNOSIS — I10 Essential (primary) hypertension: Secondary | ICD-10-CM | POA: Insufficient documentation

## 2016-07-17 DIAGNOSIS — Z923 Personal history of irradiation: Secondary | ICD-10-CM | POA: Diagnosis not present

## 2016-07-17 DIAGNOSIS — Z85850 Personal history of malignant neoplasm of thyroid: Secondary | ICD-10-CM | POA: Insufficient documentation

## 2016-07-17 DIAGNOSIS — Z79899 Other long term (current) drug therapy: Secondary | ICD-10-CM | POA: Insufficient documentation

## 2016-07-17 DIAGNOSIS — C719 Malignant neoplasm of brain, unspecified: Secondary | ICD-10-CM

## 2016-07-17 LAB — CBC WITH DIFFERENTIAL/PLATELET
Basophils Absolute: 0.1 10*3/uL (ref 0–0.1)
Basophils Relative: 1 %
Eosinophils Absolute: 0.2 10*3/uL (ref 0–0.7)
Eosinophils Relative: 2 %
HEMATOCRIT: 38.4 % (ref 35.0–47.0)
Hemoglobin: 13.3 g/dL (ref 12.0–16.0)
Lymphocytes Relative: 20 %
Lymphs Abs: 1.5 10*3/uL (ref 1.0–3.6)
MCH: 31.4 pg (ref 26.0–34.0)
MCHC: 34.7 g/dL (ref 32.0–36.0)
MCV: 90.5 fL (ref 80.0–100.0)
MONO ABS: 0.6 10*3/uL (ref 0.2–0.9)
Monocytes Relative: 7 %
NEUTROS ABS: 5.5 10*3/uL (ref 1.4–6.5)
Neutrophils Relative %: 70 %
PLATELETS: 261 10*3/uL (ref 150–440)
RBC: 4.25 MIL/uL (ref 3.80–5.20)
RDW: 14.4 % (ref 11.5–14.5)
WBC: 7.8 10*3/uL (ref 3.6–11.0)

## 2016-07-17 LAB — COMPREHENSIVE METABOLIC PANEL
ALBUMIN: 4.3 g/dL (ref 3.5–5.0)
ALT: 42 U/L (ref 14–54)
AST: 30 U/L (ref 15–41)
Alkaline Phosphatase: 80 U/L (ref 38–126)
Anion gap: 8 (ref 5–15)
BILIRUBIN TOTAL: 1.3 mg/dL — AB (ref 0.3–1.2)
BUN: 20 mg/dL (ref 6–20)
CHLORIDE: 102 mmol/L (ref 101–111)
CO2: 29 mmol/L (ref 22–32)
Calcium: 9.6 mg/dL (ref 8.9–10.3)
Creatinine, Ser: 1.04 mg/dL — ABNORMAL HIGH (ref 0.44–1.00)
GFR calc Af Amer: >60 mL/min (ref >60)
GFR calc non Af Amer: 53 mL/min — ABNORMAL LOW (ref >60)
GLUCOSE: 112 mg/dL — AB (ref 65–99)
POTASSIUM: 3 mmol/L — AB (ref 3.5–5.1)
Sodium: 139 mmol/L (ref 135–145)
Total Protein: 7.1 g/dL (ref 6.5–8.1)

## 2016-07-17 MED ORDER — TEMOZOLOMIDE 250 MG PO CAPS: ORAL_CAPSULE | ORAL | 3 refills | Status: DC

## 2016-07-17 NOTE — Progress Notes (Signed)
[place] OFFICE PROGRESS NOTE  Patient Care Team: Carmon Ginsberg, [place] as PCP - General (Family Medicine)  No matching staging information was found for the patient.   Oncology History   1. 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 (January, 2017; s/p resection Dr.Nundkumar; [place]) 2 starting radiation therapy and Temodar January of 2017 3.She has finished radiation therapy in April of 2017 and also finished Temodar for approximately 6 weeks 4.Patient started on maintenance Temodar from Jan 03, 2016; July 2017- MRI no recurrence; left hemisphere- whitish plaques ? 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    #Thyroid cancer s/p Thyroidectomy [s/p RAUI; 1967]     Glioblastoma determined by biopsy of brain ([place])   08/27/2015 Initial Diagnosis    Glioblastoma determined by biopsy of brain ([place])         INTERVAL HISTORY:  Martha [name] 70 y.o.  female pleasant patient above history of GBM right frontal status post resection and currently on adjuvant Temodar- Is here for follow-up; Patient is currently on cycle #6 of her Temodar.  Patient shingles have improved. She has been using some essential oils with no evidence of any postherpetic neuralgia. She has been drinking enough fluids. She continues to hold her blood pressure medication.    Did not notice any worsening headaches. She is chronic mild headache at the site of the incision.  Otherwise denies any seizures. No new weaknesses. Denies any unusual chest pain or shortness of breath or cough.  She walks with a cane. No falls. She has been going up and down the stairs with her cane.   REVIEW OF SYSTEMS:  A complete 10 point review of system is done which is negative except mentioned above/history of present illness.   PAST MEDICAL HISTORY :  Past Medical History:  Diagnosis Date  . Cancer [facility])    thyroid cancer  treated with radioactive iodine  . Depression   . Diabetes mellitus without complication ([place])   . Epilepsy ([place])   . Glioblastoma determined by biopsy of brain ([place])   . Hyperlipidemia   . Hypertension   . Thyroid disease    h/o thyroid ca- 30 years ago  . Varicose vein     PAST SURGICAL HISTORY :   Past Surgical History:  Procedure Laterality Date  . APPLICATION OF CRANIAL NAVIGATION N/A 08/02/2015   Procedure: APPLICATION OF CRANIAL NAVIGATION;  Surgeon: Consuella Lose, MD;  Location: [place] [facility]UR[facility];  Service: Neurosurgery;  Laterality: N/A;  APPLICATION OF CRANIAL NAVIGATION  . BILATERAL CARPAL TUNNEL RELEASE  1990,1991   left hand in 1990 right hand in '91  . CERVICAL SPINE SURGERY  05/1997   spinal fusion  . CHOLECYSTECTOMY  04/24/2008   status post laparoscopic Dr.Ely  . CRANIOTOMY Right 08/02/2015   Procedure: CRANIOTOMY TUMOR EXCISION;  Surgeon: Consuella Lose, MD;  Location: [place] [facility];  Service: Neurosurgery;  Laterality: Right;  CRANIOTOMY TUMOR EXCISION  . radioactive iodine    . THYROIDECTOMY, PARTIAL  1964   due to cancerous tumor  . TOE SURGERY Left 1989    FAMILY HISTORY :   Family History  Problem Relation Age of Onset  . Arthritis Mother   . Hyperlipidemia Mother   . Hypertension Mother   . Heart disease Mother   . Diabetes Mother     type 2  . Thyroid disease Mother   . Cancer Father  colon  . Alcohol abuse Brother   . Hyperlipidemia Brother   . Hypertension Brother   . Breast cancer Neg Hx   . Ovarian cancer Neg Hx     SOCIAL HISTORY:   Social History  Substance Use Topics  . Smoking status: Former Research scientist (life sciences)  . Smokeless tobacco: Never Used     Comment: quit smoking in 2007  . Alcohol use No    ALLERGIES:  is allergic to ampicillin; fish-derived products; tomato; and penicillins.  MEDICATIONS:  Current Outpatient Prescriptions  Medication Sig Dispense Refill  . colchicine (COLCRYS) 0.6 MG tablet One pill ever 2-3 days for gout  prevention 45 tablet 3  . glipiZIDE (GLUCOTROL) 5 MG tablet Take 1 tablet (5 mg total) by mouth 2 (two) times daily before a meal. 60 tablet 2  . HYDROcodone-acetaminophen (NORCO/VICODIN) 5-325 MG tablet One every 4-6 hours as needed for pain 28 tablet 0  . ibuprofen (ADVIL,MOTRIN) 200 MG tablet Take 200 mg by mouth every 6 (six) hours as needed.    . levETIRAcetam (KEPPRA) 500 MG tablet Take 1 tablet (500 mg total) by mouth 2 (two) times daily. Reported on 12/11/2015    . levothyroxine (SYNTHROID, LEVOTHROID) 112 MCG tablet Take 1 tablet (112 mcg total) by mouth daily. 90 tablet 3  . nortriptyline (PAMELOR) 10 MG capsule     . pantoprazole (PROTONIX) 20 MG tablet Take 1 tablet (20 mg total) by mouth daily. 30 tablet 0  . pravastatin (PRAVACHOL) 40 MG tablet TAKE 1 TABLET BY MOUTH EVERY DAY 90 tablet 1  . promethazine (PHENERGAN) 25 MG tablet TAKE 1 TABLET (25 MG TOTAL) BY MOUTH EVERY 6 (SIX) HOURS AS NEEDED FOR NAUSEA OR VOMITING. 30 tablet 0  . temozolomide (TEMODAR) 250 MG capsule Take 1 capsule by mouth for 5 days, repeat every 4 weeks. 5 capsule 3  . valACYclovir (VALTREX) 1000 MG tablet Take 1 tablet (1,000 mg total) by mouth 3 (three) times daily. 21 tablet 0  . dexamethasone (DECADRON) 4 MG tablet Take 1 tablet (4 mg total) by mouth 3 (three) times daily. (Patient not taking: Reported on 07/17/2016) 30 tablet 0  . lisinopril-hydrochlorothiazide (PRINZIDE,ZESTORETIC) 20-25 MG tablet Take 1 tablet by mouth daily. (Patient not taking: Reported on 07/17/2016) 30 tablet 3   No current facility-administered medications for this visit.     PHYSICAL EXAMINATION: ECOG PERFORMANCE STATUS: 2 - Symptomatic, <50% confined to bed  BP 100/72 (BP Location: Left Arm, Patient Position: Sitting)   Pulse (!) 103   Temp 98.7 F (37.1 C) (Tympanic)   Resp 18   Wt 152 lb 6.4 oz (69.1 kg)   BMI 28.80 kg/m   Filed Weights   07/17/16 1527  Weight: 152 lb 6.4 oz (69.1 kg)    GENERAL: Well-nourished  well-developed; Alert, no distress and comfortable.Walks with a cane. She is accompanied by her friend.  EYES: no pallor or icterus OROPHARYNX: no thrush or ulceration; good dentition  NECK: supple, no masses felt LYMPH:  no palpable lymphadenopathy in the cervical, axillary or inguinal regions LUNGS: clear to auscultation and  No wheeze or crackles HEART/CVS: regular rate & rhythm and no murmurs; No lower extremity edema ABDOMEN:abdomen soft, non-tender and normal bowel sounds Musculoskeletal:no cyanosis of digits and no clubbing  PSYCH: alert & oriented x 3 with fluent speech NEURO: no focal motor/sensory deficits; fine tremors of bilateral upper extremities. SKIN:  no rashes or significant lesions; scabbing rash noted around the shoulder and the right chest wall. No pus  noted  LABORATORY DATA:  I have reviewed the data as listed    Component Value Date/Time   NA 139 07/17/2016 1308   NA 148 (H) 11/14/2015 1623   NA 142 11/14/2014 1506   K 3.0 (L) 07/17/2016 1308   K 3.8 11/14/2014 1506   CL 102 07/17/2016 1308   CL 110 11/14/2014 1506   CO2 29 07/17/2016 1308   CO2 23 11/14/2014 1506   GLUCOSE 112 (H) 07/17/2016 1308   GLUCOSE 104 (H) 11/14/2014 1506   BUN 20 07/17/2016 1308   BUN 13 11/14/2015 1623   BUN 31 (H) 11/14/2014 1506   CREATININE 1.04 (H) 07/17/2016 1308   CREATININE 1.08 (H) 11/14/2014 1506   CALCIUM 9.6 07/17/2016 1308   CALCIUM 9.5 11/14/2014 1506   PROT 7.1 07/17/2016 1308   ALBUMIN 4.3 07/17/2016 1308   ALBUMIN 3.9 11/14/2015 1623   AST 30 07/17/2016 1308   ALT 42 07/17/2016 1308   ALKPHOS 80 07/17/2016 1308   BILITOT 1.3 (H) 07/17/2016 1308   GFRNONAA 53 (L) 07/17/2016 1308   GFRNONAA 53 (L) 11/14/2014 1506   GFRAA >60 07/17/2016 1308   GFRAA >60 11/14/2014 1506    No results found for: SPEP, UPEP  Lab Results  Component Value Date   WBC 7.8 07/17/2016   NEUTROABS 5.5 07/17/2016   HGB 13.3 07/17/2016   HCT 38.4 07/17/2016   MCV 90.5  07/17/2016   PLT 261 07/17/2016      Chemistry      Component Value Date/Time   NA 139 07/17/2016 1308   NA 148 (H) 11/14/2015 1623   NA 142 11/14/2014 1506   K 3.0 (L) 07/17/2016 1308   K 3.8 11/14/2014 1506   CL 102 07/17/2016 1308   CL 110 11/14/2014 1506   CO2 29 07/17/2016 1308   CO2 23 11/14/2014 1506   BUN 20 07/17/2016 1308   BUN 13 11/14/2015 1623   BUN 31 (H) 11/14/2014 1506   CREATININE 1.04 (H) 07/17/2016 1308   CREATININE 1.08 (H) 11/14/2014 1506      Component Value Date/Time   CALCIUM 9.6 07/17/2016 1308   CALCIUM 9.5 11/14/2014 1506   ALKPHOS 80 07/17/2016 1308   AST 30 07/17/2016 1308   ALT 42 07/17/2016 1308   BILITOT 1.3 (H) 07/17/2016 1308       RADIOGRAPHIC STUDIES: I have personally reviewed the radiological images as listed and agreed with the findings in the report. No results found.   ASSESSMENT & PLAN:  Glioblastoma determined by biopsy of brain [facility]) Status post resection followed by concurrent chemoradiation-currently on cycle # 6  of adjuvant therapy. SEP 20th 2017- MRI shows no definite evidence of recurrence in the resection cavity; slight enhancement noted around the cavity.  # Clinically no evidence of progression. Continue Temodar as planned. We'll plan MRI of the brain towards end of January 2018; or sooner if symptomatic.  # acute renal failure/hypotension- creatinine- 2.14; base line 0.8. Creatinine- 1.04 today.  Continue holding BP medication; increase fluids.  # Hypokalemia potassium 3.0 recommend dietary changes.  # Shingles- on valacyclovir; resolved. No evidence of postherpetic neuralgia.  # History of thyroid cancer - on Synthroid; TSH suppression.   # follow up in 4 weeks/ labs.    Orders Placed This Encounter  Procedures  . CBC with Differential/Platelet    Standing Status:   Future    Standing Expiration Date:   01/15/2017  . Comprehensive metabolic panel    Standing Status:  Future    Standing Expiration Date:    01/15/2017      Cammie Sickle, MD 07/17/2016 4:15 PM

## 2016-07-17 NOTE — Progress Notes (Signed)
Patient is here for follow up, she is doing well 

## 2016-07-17 NOTE — Assessment & Plan Note (Signed)
Status post resection followed by concurrent chemoradiation-currently on cycle # 6  of adjuvant therapy. SEP 20th 2017- MRI shows no definite evidence of recurrence in the resection cavity; slight enhancement noted around the cavity.  # Clinically no evidence of progression. Continue Temodar as planned. We'll plan MRI of the brain towards end of January 2018; or sooner if symptomatic.  # acute renal failure/hypotension- creatinine- 2.14; base line 0.8. Creatinine- 1.04 today.  Continue holding BP medication; increase fluids.  # Hypokalemia potassium 3.0 recommend dietary changes.  # Shingles- on valacyclovir; resolved. No evidence of postherpetic neuralgia.  # History of thyroid cancer - on Synthroid; TSH suppression.   # follow up in 4 weeks/ labs.

## 2016-08-14 ENCOUNTER — Inpatient Hospital Stay: Payer: Medicare Other

## 2016-08-14 ENCOUNTER — Other Ambulatory Visit: Payer: Self-pay

## 2016-08-14 ENCOUNTER — Inpatient Hospital Stay: Payer: Medicare Other | Attending: Internal Medicine | Admitting: Oncology

## 2016-08-14 VITALS — BP 101/68 | HR 96 | Temp 97.8°F | Wt 156.5 lb

## 2016-08-14 DIAGNOSIS — E119 Type 2 diabetes mellitus without complications: Secondary | ICD-10-CM | POA: Insufficient documentation

## 2016-08-14 DIAGNOSIS — C712 Malignant neoplasm of temporal lobe: Secondary | ICD-10-CM

## 2016-08-14 DIAGNOSIS — E785 Hyperlipidemia, unspecified: Secondary | ICD-10-CM | POA: Insufficient documentation

## 2016-08-14 DIAGNOSIS — I10 Essential (primary) hypertension: Secondary | ICD-10-CM | POA: Insufficient documentation

## 2016-08-14 DIAGNOSIS — Z85850 Personal history of malignant neoplasm of thyroid: Secondary | ICD-10-CM | POA: Diagnosis not present

## 2016-08-14 DIAGNOSIS — Z923 Personal history of irradiation: Secondary | ICD-10-CM

## 2016-08-14 DIAGNOSIS — L309 Dermatitis, unspecified: Secondary | ICD-10-CM | POA: Insufficient documentation

## 2016-08-14 DIAGNOSIS — F329 Major depressive disorder, single episode, unspecified: Secondary | ICD-10-CM | POA: Insufficient documentation

## 2016-08-14 DIAGNOSIS — Z79899 Other long term (current) drug therapy: Secondary | ICD-10-CM

## 2016-08-14 DIAGNOSIS — C719 Malignant neoplasm of brain, unspecified: Secondary | ICD-10-CM

## 2016-08-14 DIAGNOSIS — Z87891 Personal history of nicotine dependence: Secondary | ICD-10-CM | POA: Insufficient documentation

## 2016-08-14 DIAGNOSIS — G40909 Epilepsy, unspecified, not intractable, without status epilepticus: Secondary | ICD-10-CM | POA: Diagnosis not present

## 2016-08-14 LAB — COMPREHENSIVE METABOLIC PANEL
ALBUMIN: 4.1 g/dL (ref 3.5–5.0)
ALT: 44 U/L (ref 14–54)
AST: 27 U/L (ref 15–41)
Alkaline Phosphatase: 83 U/L (ref 38–126)
Anion gap: 7 (ref 5–15)
BUN: 32 mg/dL — ABNORMAL HIGH (ref 6–20)
CALCIUM: 9.4 mg/dL (ref 8.9–10.3)
CHLORIDE: 105 mmol/L (ref 101–111)
CO2: 27 mmol/L (ref 22–32)
Creatinine, Ser: 1.11 mg/dL — ABNORMAL HIGH (ref 0.44–1.00)
GFR calc non Af Amer: 49 mL/min — ABNORMAL LOW (ref >60)
GFR, EST AFRICAN AMERICAN: 57 mL/min — AB (ref >60)
Glucose, Bld: 127 mg/dL — ABNORMAL HIGH (ref 65–99)
POTASSIUM: 3.4 mmol/L — AB (ref 3.5–5.1)
SODIUM: 139 mmol/L (ref 135–145)
Total Bilirubin: 1 mg/dL (ref 0.3–1.2)
Total Protein: 6.9 g/dL (ref 6.5–8.1)

## 2016-08-14 LAB — CBC WITH DIFFERENTIAL/PLATELET
BASOS PCT: 1 %
Basophils Absolute: 0.1 10*3/uL (ref 0–0.1)
EOS ABS: 0.2 10*3/uL (ref 0–0.7)
EOS PCT: 2 %
HCT: 39.5 % (ref 35.0–47.0)
Hemoglobin: 13.6 g/dL (ref 12.0–16.0)
LYMPHS ABS: 1.4 10*3/uL (ref 1.0–3.6)
Lymphocytes Relative: 18 %
MCH: 31.4 pg (ref 26.0–34.0)
MCHC: 34.6 g/dL (ref 32.0–36.0)
MCV: 91 fL (ref 80.0–100.0)
MONOS PCT: 8 %
Monocytes Absolute: 0.6 10*3/uL (ref 0.2–0.9)
Neutro Abs: 5.5 10*3/uL (ref 1.4–6.5)
Neutrophils Relative %: 71 %
PLATELETS: 247 10*3/uL (ref 150–440)
RBC: 4.34 MIL/uL (ref 3.80–5.20)
RDW: 12.5 % (ref 11.5–14.5)
WBC: 7.8 10*3/uL (ref 3.6–11.0)

## 2016-08-14 NOTE — Progress Notes (Signed)
Hematology/Oncology Consult note [facility]  Telephone:([phone] Fax:([phone]  Patient Care Team: Carmon Ginsberg, [place] as PCP - General (Family Medicine)   Name of the patient: Martha [name]  [id number]  1946/06/12   Date of visit: 08/14/16  Diagnosis- glioblastoma multiforme   Chief complaint/ Reason for visit- routine on treatment follow-up of GBM  Heme/Onc history:  Oncology History   1. 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 (January, 2017; s/p resection Dr.Nundkumar; [place]) 2 starting radiation therapy and Temodar January of 2017 3.She has finished radiation therapy in April of 2017 and also finished Temodar for approximately 6 weeks 4.Patient started on maintenance Temodar from Jan 03, 2016; July 2017- MRI no recurrence; left hemisphere- whitish plaques ? 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    #Thyroid cancer s/p Thyroidectomy [s/p RAUI; 1967]     Glioblastoma determined by biopsy of brain ([place])   08/27/2015 Initial Diagnosis    Glioblastoma determined by biopsy of brain ([place])        Interval history- overall she is tolerating her Temodar well without any significant side effects. Herpes rash has healed well. She does Have few Circular dry plaques throughtout her body for which she has been applying topical lotion. Patient continues to live alone and is independent of her ADLs and IADLs. She has not had any falls.   ECOG PS- 1-2 Pain scale- no Opioid associated constipation- no  Review of systems- Review of Systems  Constitutional: Negative for chills, fever, malaise/fatigue and weight loss.  HENT: Negative for congestion, ear discharge and nosebleeds.   Eyes: Negative for blurred vision.  Respiratory: Negative for cough, hemoptysis, sputum production, shortness of breath and wheezing.   Cardiovascular: Negative for chest  pain, palpitations, orthopnea and claudication.  Gastrointestinal: Negative for abdominal pain, blood in stool, constipation, diarrhea, heartburn, melena, nausea and vomiting.  Genitourinary: Negative for dysuria, flank pain, frequency, hematuria and urgency.  Musculoskeletal: Negative for back pain, joint pain and myalgias.  Skin: Negative for rash.  Neurological: Negative for dizziness, tingling, focal weakness, seizures, weakness and headaches.  Endo/Heme/Allergies: Does not bruise/bleed easily.  Psychiatric/Behavioral: Negative for depression and suicidal ideas. The patient does not have insomnia.      Current treatment- temodar  Allergies  Allergen Reactions  . Ampicillin Other (See Comments)    Yeast rash   . Fish-Derived Products Other (See Comments)    gout  . Tomato Other (See Comments)    Upset stomach   . Penicillins Rash     Past Medical History:  Diagnosis Date  . Cancer [facility])    thyroid cancer treated with radioactive iodine  . Depression   . Diabetes mellitus without complication ([place])   . Epilepsy ([place])   . Glioblastoma determined by biopsy of brain ([place])   . Hyperlipidemia   . Hypertension   . Thyroid disease    h/o thyroid ca- 30 years ago  . Varicose vein      Past Surgical History:  Procedure Laterality Date  . APPLICATION OF CRANIAL NAVIGATION N/A 08/02/2015   Procedure: APPLICATION OF CRANIAL NAVIGATION;  Surgeon: Consuella Lose, MD;  Location: [place] NEURO ORS;  Service: Neurosurgery;  Laterality: N/A;  APPLICATION OF CRANIAL NAVIGATION  . BILATERAL CARPAL TUNNEL RELEASE  1990,1991   left hand in 1990 right hand in '91  . CERVICAL SPINE SURGERY  05/1997   spinal fusion  . CHOLECYSTECTOMY  04/24/2008   status post laparoscopic Dr.Ely  . CRANIOTOMY Right 08/02/2015   Procedure: CRANIOTOMY TUMOR EXCISION;  Surgeon: Consuella Lose, MD;  Location: [place] NEURO ORS;  Service: Neurosurgery;  Laterality: Right;  CRANIOTOMY TUMOR EXCISION  . radioactive  iodine    . THYROIDECTOMY, PARTIAL  1964   due to cancerous tumor  . TOE SURGERY Left 1989    Social History   Social History  . Marital status: Single    Spouse name: N/A  . Number of children: N/A  . Years of education: N/A   Occupational History  . Not on file.   Social History Main Topics  . Smoking status: Former Research scientist (life sciences)  . Smokeless tobacco: Never Used     Comment: quit smoking in 2007  . Alcohol use No  . Drug use: No  . Sexual activity: No   Other Topics Concern  . Not on file   Social History Narrative  . No narrative on file    Family History  Problem Relation Age of Onset  . Arthritis Mother   . Hyperlipidemia Mother   . Hypertension Mother   . Heart disease Mother   . Diabetes Mother     type 2  . Thyroid disease Mother   . Cancer Father     colon  . Alcohol abuse Brother   . Hyperlipidemia Brother   . Hypertension Brother   . Breast cancer Neg Hx   . Ovarian cancer Neg Hx      Current Outpatient Prescriptions:  .  colchicine (COLCRYS) 0.6 MG tablet, One pill ever 2-3 days for gout prevention, Disp: 45 tablet, Rfl: 3 .  HYDROcodone-acetaminophen (NORCO/VICODIN) 5-325 MG tablet, One every 4-6 hours as needed for pain, Disp: 28 tablet, Rfl: 0 .  ibuprofen (ADVIL,MOTRIN) 200 MG tablet, Take 200 mg by mouth every 6 (six) hours as needed., Disp: , Rfl:  .  levETIRAcetam (KEPPRA) 500 MG tablet, Take 1 tablet (500 mg total) by mouth 2 (two) times daily. Reported on 12/11/2015, Disp: , Rfl:  .  levothyroxine (SYNTHROID, LEVOTHROID) 112 MCG tablet, Take 1 tablet (112 mcg total) by mouth daily., Disp: 90 tablet, Rfl: 3 .  lisinopril-hydrochlorothiazide (PRINZIDE,ZESTORETIC) 20-25 MG tablet, Take 1 tablet by mouth daily., Disp: 30 tablet, Rfl: 3 .  nortriptyline (PAMELOR) 10 MG capsule, , Disp: , Rfl:  .  pantoprazole (PROTONIX) 20 MG tablet, Take 1 tablet (20 mg total) by mouth daily., Disp: 30 tablet, Rfl: 0 .  pravastatin (PRAVACHOL) 40 MG tablet, TAKE 1  TABLET BY MOUTH EVERY DAY, Disp: 90 tablet, Rfl: 1 .  promethazine (PHENERGAN) 25 MG tablet, TAKE 1 TABLET (25 MG TOTAL) BY MOUTH EVERY 6 (SIX) HOURS AS NEEDED FOR NAUSEA OR VOMITING., Disp: 30 tablet, Rfl: 0 .  temozolomide (TEMODAR) 250 MG capsule, Take 1 capsule by mouth for 5 days, repeat every 4 weeks., Disp: 5 capsule, Rfl: 3 .  valACYclovir (VALTREX) 1000 MG tablet, Take 1 tablet (1,000 mg total) by mouth 3 (three) times daily., Disp: 21 tablet, Rfl: 0 .  dexamethasone (DECADRON) 4 MG tablet, Take 1 tablet (4 mg total) by mouth 3 (three) times daily. (Patient not taking: Reported on 08/14/2016), Disp: 30 tablet, Rfl: 0 .  glipiZIDE (GLUCOTROL) 5 MG tablet, Take 1 tablet (5 mg total) by mouth 2 (two) times daily before a meal. (Patient not taking: Reported on 08/14/2016), Disp: 60 tablet, Rfl: 2  Physical exam:  Vitals:   08/14/16 1046  BP: 101/68  Pulse: 96  Temp: 97.8 F (36.6 C)  TempSrc: Tympanic  Weight: 156 lb 8.4 oz (71 kg)   Physical Exam  Constitutional: She is oriented to person, place, and time and well-developed, well-nourished, and in no distress.  HENT:  Head: Normocephalic and atraumatic.  Eyes: EOM are normal. Pupils are equal, round, and reactive to light.  Neck: Normal range of motion.  Cardiovascular: Normal rate, regular rhythm and normal heart sounds.   Pulmonary/Chest: Effort normal and breath sounds normal.  Abdominal: Soft. Bowel sounds are normal.  Neurological: She is alert and oriented to person, place, and time.  Skin: Skin is warm and dry.  Circular plaque-like lesions about half a centimeter in diameter are seen scattered in a few places.     CMP Latest Ref Rng & Units 08/14/2016  Glucose 65 - 99 mg/dL 127(H)  BUN 6 - 20 mg/dL 32(H)  Creatinine 0.44 - 1.00 mg/dL 1.11(H)  Sodium 135 - 145 mmol/L 139  Potassium 3.5 - 5.1 mmol/L 3.4(L)  Chloride 101 - 111 mmol/L 105  CO2 22 - 32 mmol/L 27  Calcium 8.9 - 10.3 mg/dL 9.4  Total Protein 6.5 - 8.1 g/dL  6.9  Total Bilirubin 0.3 - 1.2 mg/dL 1.0  Alkaline Phos 38 - 126 U/L 83  AST 15 - 41 U/L 27  ALT 14 - 54 U/L 44   CBC Latest Ref Rng & Units 08/14/2016  WBC 3.6 - 11.0 K/uL 7.8  Hemoglobin 12.0 - 16.0 g/dL 13.6  Hematocrit 35.0 - 47.0 % 39.5  Platelets 150 - 440 K/uL 247    Assessment and plan- Patient is a 71 y.o. female with a history of GBM status post debulking and adjuvant chemotherapy and radiation   1 patient has completed 6 cycles of Temodar and has tolerated it well so far. Her last MRI from September 2017 showed some signal intensity around the resection cavity which needed a close follow-up. I will schedule a repeat MRI end of this I discussed her case with Dr. Rogue Bussing and the plan is to continue Temodar beyond 6 cycles given possibility of local recurrence. I will therefore go ahead and prescribe cycle #7 of Temodar which she will be taking 5 days a month.next cycle starts on 09/07/2016. She will see Dr. Rogue Bussing back in 4 weeks' time with CBC and CMP and to discuss the results of the MRI.  2. Scattered skin plaques- they appear like eczema like lesions. They do not appear herpetic. I have suggested that she can continue her topical emollient cream at this time.    Diagnosis 1. Glioblastoma determined by biopsy of brain [facility])      Dr. Randa Evens, MD, MPH [facility] Pager- [phone] 08/14/2016 11:17 AM

## 2016-08-26 ENCOUNTER — Other Ambulatory Visit: Payer: Self-pay | Admitting: Nurse Practitioner

## 2016-09-01 ENCOUNTER — Other Ambulatory Visit: Payer: Self-pay | Admitting: Internal Medicine

## 2016-09-01 DIAGNOSIS — C719 Malignant neoplasm of brain, unspecified: Secondary | ICD-10-CM

## 2016-09-04 ENCOUNTER — Ambulatory Visit
Admission: RE | Admit: 2016-09-04 | Discharge: 2016-09-04 | Disposition: A | Discharge status: Home / Self Care | Payer: Medicare Other | Source: Ambulatory Visit | Attending: Oncology | Admitting: Oncology

## 2016-09-04 DIAGNOSIS — R930 Abnormal findings on diagnostic imaging of skull and head, not elsewhere classified: Secondary | ICD-10-CM | POA: Diagnosis not present

## 2016-09-04 DIAGNOSIS — C719 Malignant neoplasm of brain, unspecified: Secondary | ICD-10-CM | POA: Insufficient documentation

## 2016-09-04 MED ORDER — GADOBENATE DIMEGLUMINE 529 MG/ML IV SOLN
14.0000 mL | Freq: Once | INTRAVENOUS | Status: AC | PRN
  Administered 2016-09-04: 14 mL via INTRAVENOUS

## 2016-09-07 ENCOUNTER — Other Ambulatory Visit: Payer: Self-pay | Admitting: Family Medicine

## 2016-09-07 DIAGNOSIS — B029 Zoster without complications: Secondary | ICD-10-CM

## 2016-09-11 ENCOUNTER — Inpatient Hospital Stay: Payer: Medicare Other | Attending: Internal Medicine | Admitting: Internal Medicine

## 2016-09-11 VITALS — BP 122/87 | HR 99 | Temp 97.6°F | Resp 18 | Ht 61.0 in | Wt 161.8 lb

## 2016-09-11 DIAGNOSIS — B369 Superficial mycosis, unspecified: Secondary | ICD-10-CM | POA: Insufficient documentation

## 2016-09-11 DIAGNOSIS — E785 Hyperlipidemia, unspecified: Secondary | ICD-10-CM | POA: Insufficient documentation

## 2016-09-11 DIAGNOSIS — C712 Malignant neoplasm of temporal lobe: Secondary | ICD-10-CM | POA: Insufficient documentation

## 2016-09-11 DIAGNOSIS — E119 Type 2 diabetes mellitus without complications: Secondary | ICD-10-CM | POA: Insufficient documentation

## 2016-09-11 DIAGNOSIS — C719 Malignant neoplasm of brain, unspecified: Secondary | ICD-10-CM

## 2016-09-11 DIAGNOSIS — Z85850 Personal history of malignant neoplasm of thyroid: Secondary | ICD-10-CM | POA: Insufficient documentation

## 2016-09-11 DIAGNOSIS — Z87891 Personal history of nicotine dependence: Secondary | ICD-10-CM | POA: Insufficient documentation

## 2016-09-11 DIAGNOSIS — E876 Hypokalemia: Secondary | ICD-10-CM | POA: Insufficient documentation

## 2016-09-11 DIAGNOSIS — F329 Major depressive disorder, single episode, unspecified: Secondary | ICD-10-CM | POA: Diagnosis not present

## 2016-09-11 DIAGNOSIS — G40909 Epilepsy, unspecified, not intractable, without status epilepticus: Secondary | ICD-10-CM | POA: Insufficient documentation

## 2016-09-11 DIAGNOSIS — Z923 Personal history of irradiation: Secondary | ICD-10-CM | POA: Insufficient documentation

## 2016-09-11 DIAGNOSIS — I10 Essential (primary) hypertension: Secondary | ICD-10-CM | POA: Diagnosis not present

## 2016-09-11 NOTE — Progress Notes (Signed)
Patient here for her MRI results. H/o glioblastoma.  Patient c/o crusty red rash on left thigh and left wrist. Patient is applying frankincense oil on the skin rash to improve itching. Patient will start back on her Temodar on Monday 09/14/16. She has not missed any dosing. She takes the capsule daily x 5 days and then repeats the dosing every 4 weeks. She takes her phenergan when she takes the Temodar to prevent chemotherapy induced nausea.  She states that he had "an aura h/a last week but the headache resolved with her sitting still for a few minutes."  Reports slight memory impairment - not interfering with ADLs.

## 2016-09-11 NOTE — Assessment & Plan Note (Addendum)
Status post resection followed by concurrent chemoradiation-currently on cycle # 6  of adjuvant therapy. JAN 31st 2018- MRI shows no definite evidence of recurrence in the resection cavity; slight enhancement noted around the cavity; STABLE.   # Clinically no evidence of progression. Discussed pros and cons of continued Temodar therapy. As patient is tolerating well Continue Temodar as planned.   # acute renal failure/hypotension- resolved-   # Hypokalemia potassium 3.2- recommend dietary changes.  # Shingles- s/p valacyclovir; resolved. No evidence of postherpetic neuralgia.  # skin fungal infection- recommend topical anti-fungal.  # History of thyroid cancer - on Synthroid; TSH suppression.   # follow up in 4 weeks/ labs.  # I reviewed the blood work- with the patient in detail; also reviewed the imaging independently [as summarized above]; and with the patient in detail.

## 2016-09-11 NOTE — Progress Notes (Signed)
[place] OFFICE PROGRESS NOTE  Patient Care Team: Carmon Ginsberg, [place] as PCP - General (Family Medicine)  No matching staging information was found for the patient.   Oncology History   1. 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 (January, 2017; s/p resection Dr.Nundkumar; [place]) 2 starting radiation therapy and Temodar January of 2017 3.She has finished radiation therapy in April of 2017 and also finished Temodar for approximately 6 weeks 4.Patient started on maintenance Temodar from Jan 03, 2016; July 2017- MRI no recurrence; left hemisphere- whitish plaques ? 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    #Thyroid cancer s/p Thyroidectomy [s/p RAUI; 1967]     Glioblastoma determined by biopsy of brain ([place])   08/27/2015 Initial Diagnosis    Glioblastoma determined by biopsy of brain ([place])         INTERVAL HISTORY:  Martha [name] 71 y.o.  female pleasant patient above history of GBM right frontal status post resection and currently on adjuvant Temodar- Is here for follow-up/ review the results of MRI brain.   Patient complains of nausea/ vomiting the first day of the Temodar cycle. She also attributes that the food that she might have eaten the night before.   She noted to have a rounded skin lesions intensely itchy; the left side of the thigh; and also left wrist area.   Patient shingles have improved. Denies any pain.  Did not notice any worsening headaches.  Otherwise denies any seizures. No new weaknesses. Denies any unusual chest pain or shortness of breath or cough.  She walks with a cane. No falls.   REVIEW OF SYSTEMS:  A complete 10 point review of system is done which is negative except mentioned above/history of present illness.   PAST MEDICAL HISTORY :  Past Medical History:  Diagnosis Date  . Cancer [facility])    thyroid cancer treated with radioactive  iodine  . Depression   . Diabetes mellitus without complication ([place])   . Epilepsy ([place])   . Glioblastoma determined by biopsy of brain ([place])   . Hyperlipidemia   . Hypertension   . Thyroid disease    h/o thyroid ca- 30 years ago  . Varicose vein     PAST SURGICAL HISTORY :   Past Surgical History:  Procedure Laterality Date  . APPLICATION OF CRANIAL NAVIGATION N/A 08/02/2015   Procedure: APPLICATION OF CRANIAL NAVIGATION;  Surgeon: Consuella Lose, MD;  Location: [place] NEURO ORS;  Service: Neurosurgery;  Laterality: N/A;  APPLICATION OF CRANIAL NAVIGATION  . BILATERAL CARPAL TUNNEL RELEASE  1990,1991   left hand in 1990 right hand in '91  . CERVICAL SPINE SURGERY  05/1997   spinal fusion  . CHOLECYSTECTOMY  04/24/2008   status post laparoscopic Dr.Ely  . CRANIOTOMY Right 08/02/2015   Procedure: CRANIOTOMY TUMOR EXCISION;  Surgeon: Consuella Lose, MD;  Location: [place] NEURO ORS;  Service: Neurosurgery;  Laterality: Right;  CRANIOTOMY TUMOR EXCISION  . radioactive iodine    . THYROIDECTOMY, PARTIAL  1964   due to cancerous tumor  . TOE SURGERY Left 1989    FAMILY HISTORY :   Family History  Problem Relation Age of Onset  . Arthritis Mother   . Hyperlipidemia Mother   . Hypertension Mother   . Heart disease Mother   . Diabetes Mother     type 2  . Thyroid disease Mother   . Cancer Father  colon  . Alcohol abuse Brother   . Hyperlipidemia Brother   . Hypertension Brother   . Breast cancer Neg Hx   . Ovarian cancer Neg Hx     SOCIAL HISTORY:   Social History  Substance Use Topics  . Smoking status: Former Research scientist (life sciences)  . Smokeless tobacco: Never Used     Comment: quit smoking in 2007  . Alcohol use No    ALLERGIES:  is allergic to ampicillin; fish-derived products; tomato; and penicillins.  MEDICATIONS:  Current Outpatient Prescriptions  Medication Sig Dispense Refill  . colchicine (COLCRYS) 0.6 MG tablet One pill ever 2-3 days for gout prevention 45 tablet 3   . HYDROcodone-acetaminophen (NORCO/VICODIN) 5-325 MG tablet One every 4-6 hours as needed for pain 28 tablet 0  . levETIRAcetam (KEPPRA) 500 MG tablet Take 1 tablet (500 mg total) by mouth 2 (two) times daily. Reported on 12/11/2015    . levothyroxine (SYNTHROID, LEVOTHROID) 112 MCG tablet Take 1 tablet (112 mcg total) by mouth daily. 90 tablet 3  . lisinopril-hydrochlorothiazide (PRINZIDE,ZESTORETIC) 20-25 MG tablet Take 1 tablet by mouth daily. 30 tablet 3  . nortriptyline (PAMELOR) 10 MG capsule TAKE ONE TO THREE CAPSULES AT BEDTIME TO HELP WITH SLEEP AND NERVE PAIN 30 capsule 0  . pantoprazole (PROTONIX) 20 MG tablet Take 1 tablet (20 mg total) by mouth daily. 30 tablet 0  . pravastatin (PRAVACHOL) 40 MG tablet TAKE 1 TABLET BY MOUTH EVERY DAY 90 tablet 1  . promethazine (PHENERGAN) 25 MG tablet TAKE 1 TABLET (25 MG TOTAL) BY MOUTH EVERY 6 (SIX) HOURS AS NEEDED FOR NAUSEA OR VOMITING. 30 tablet 0  . temozolomide (TEMODAR) 250 MG capsule Take 1 capsule by mouth for 5 days, repeat every 4 weeks. 5 capsule 3  . valACYclovir (VALTREX) 1000 MG tablet Take 1 tablet (1,000 mg total) by mouth 3 (three) times daily. 21 tablet 0  . ibuprofen (ADVIL,MOTRIN) 200 MG tablet Take 200 mg by mouth every 6 (six) hours as needed.     No current facility-administered medications for this visit.     PHYSICAL EXAMINATION: ECOG PERFORMANCE STATUS: 2 - Symptomatic, <50% confined to bed  BP 122/87 (BP Location: Left Arm, Patient Position: Sitting)   Pulse 99   Temp 97.6 F (36.4 C) (Tympanic)   Resp 18   Ht 5\' 1"  (1.549 m)   Wt 161 lb 12.8 oz (73.4 kg)   BMI 30.57 kg/m   Filed Weights   09/11/16 1108  Weight: 161 lb 12.8 oz (73.4 kg)    GENERAL: Well-nourished well-developed; Alert, no distress and comfortable.Walks with a cane. She is accompanied by her friend.  EYES: no pallor or icterus OROPHARYNX: no thrush or ulceration; good dentition  NECK: supple, no masses felt LYMPH:  no palpable  lymphadenopathy in the cervical, axillary or inguinal regions LUNGS: clear to auscultation and  No wheeze or crackles HEART/CVS: regular rate & rhythm and no murmurs; No lower extremity edema ABDOMEN:abdomen soft, non-tender and normal bowel sounds Musculoskeletal:no cyanosis of digits and no clubbing  PSYCH: alert & oriented x 3 with fluent speech NEURO: no focal motor/sensory deficits; fine tremors of bilateral upper extremities. SKIN:  no rashes or significant lesions; scabbing rash noted around the shoulder and the right chest wall. Mild macular/raised/papular lesion ~2cm- in left thigh.   LABORATORY DATA:  I have reviewed the data as listed    Component Value Date/Time   NA 139 08/14/2016 1015   NA 148 (H) 11/14/2015 1623   NA  142 11/14/2014 1506   K 3.4 (L) 08/14/2016 1015   K 3.8 11/14/2014 1506   CL 105 08/14/2016 1015   CL 110 11/14/2014 1506   CO2 27 08/14/2016 1015   CO2 23 11/14/2014 1506   GLUCOSE 127 (H) 08/14/2016 1015   GLUCOSE 104 (H) 11/14/2014 1506   BUN 32 (H) 08/14/2016 1015   BUN 13 11/14/2015 1623   BUN 31 (H) 11/14/2014 1506   CREATININE 1.11 (H) 08/14/2016 1015   CREATININE 1.08 (H) 11/14/2014 1506   CALCIUM 9.4 08/14/2016 1015   CALCIUM 9.5 11/14/2014 1506   PROT 6.9 08/14/2016 1015   ALBUMIN 4.1 08/14/2016 1015   ALBUMIN 3.9 11/14/2015 1623   AST 27 08/14/2016 1015   ALT 44 08/14/2016 1015   ALKPHOS 83 08/14/2016 1015   BILITOT 1.0 08/14/2016 1015   GFRNONAA 49 (L) 08/14/2016 1015   GFRNONAA 53 (L) 11/14/2014 1506   GFRAA 57 (L) 08/14/2016 1015   GFRAA >60 11/14/2014 1506    No results found for: SPEP, UPEP  Lab Results  Component Value Date   WBC 7.8 08/14/2016   NEUTROABS 5.5 08/14/2016   HGB 13.6 08/14/2016   HCT 39.5 08/14/2016   MCV 91.0 08/14/2016   PLT 247 08/14/2016      Chemistry      Component Value Date/Time   NA 139 08/14/2016 1015   NA 148 (H) 11/14/2015 1623   NA 142 11/14/2014 1506   K 3.4 (L) 08/14/2016 1015   K  3.8 11/14/2014 1506   CL 105 08/14/2016 1015   CL 110 11/14/2014 1506   CO2 27 08/14/2016 1015   CO2 23 11/14/2014 1506   BUN 32 (H) 08/14/2016 1015   BUN 13 11/14/2015 1623   BUN 31 (H) 11/14/2014 1506   CREATININE 1.11 (H) 08/14/2016 1015   CREATININE 1.08 (H) 11/14/2014 1506      Component Value Date/Time   CALCIUM 9.4 08/14/2016 1015   CALCIUM 9.5 11/14/2014 1506   ALKPHOS 83 08/14/2016 1015   AST 27 08/14/2016 1015   ALT 44 08/14/2016 1015   BILITOT 1.0 08/14/2016 1015     IMPRESSION: 1. Unchanged appearance of right temporal lobe resection site and mild surrounding white matter T2 signal abnormality. 2. Unchanged scattered foci of enhancement and mineralization predominantly in the left cerebral hemisphere, indeterminate but may reflect treated thyroid metastases or prior infection.   Electronically Signed   By: Logan Bores M.D.   On: 09/04/2016 12:45  RADIOGRAPHIC STUDIES: I have personally reviewed the radiological images as listed and agreed with the findings in the report. No results found.   ASSESSMENT & PLAN:  Glioblastoma determined by biopsy of brain [facility]) Status post resection followed by concurrent chemoradiation-currently on cycle # 6  of adjuvant therapy. JAN 31st 2018- MRI shows no definite evidence of recurrence in the resection cavity; slight enhancement noted around the cavity; STABLE.   # Clinically no evidence of progression. Discussed pros and cons of continued Temodar therapy. As patient is tolerating well Continue Temodar as planned.   # acute renal failure/hypotension- resolved-   # Hypokalemia potassium 3.2- recommend dietary changes.  # Shingles- s/p valacyclovir; resolved. No evidence of postherpetic neuralgia.  # skin fungal infection- recommend topical anti-fungal.  # History of thyroid cancer - on Synthroid; TSH suppression.   # follow up in 4 weeks/ labs.  # I reviewed the blood work- with the patient in detail; also reviewed  the imaging independently [as summarized above]; and  with the patient in detail.     Orders Placed This Encounter  Procedures  . CBC with Differential    Standing Status:   Future    Standing Expiration Date:   09/11/2017  . Comprehensive metabolic panel    Standing Status:   Future    Standing Expiration Date:   09/11/2017      Cammie Sickle, MD 09/11/2016 12:09 PM

## 2016-10-06 ENCOUNTER — Ambulatory Visit (INDEPENDENT_AMBULATORY_CARE_PROVIDER_SITE_OTHER): Payer: Medicare Other | Admitting: Family Medicine

## 2016-10-06 ENCOUNTER — Encounter: Payer: Self-pay | Admitting: Family Medicine

## 2016-10-06 VITALS — BP 140/100 | HR 80 | Temp 97.9°F | Resp 16 | Wt 165.2 lb

## 2016-10-06 DIAGNOSIS — E1122 Type 2 diabetes mellitus with diabetic chronic kidney disease: Secondary | ICD-10-CM

## 2016-10-06 DIAGNOSIS — E039 Hypothyroidism, unspecified: Secondary | ICD-10-CM | POA: Diagnosis not present

## 2016-10-06 DIAGNOSIS — I152 Hypertension secondary to endocrine disorders: Secondary | ICD-10-CM

## 2016-10-06 DIAGNOSIS — I10 Essential (primary) hypertension: Secondary | ICD-10-CM

## 2016-10-06 DIAGNOSIS — E1159 Type 2 diabetes mellitus with other circulatory complications: Secondary | ICD-10-CM

## 2016-10-06 DIAGNOSIS — N183 Chronic kidney disease, stage 3 unspecified: Secondary | ICD-10-CM

## 2016-10-06 DIAGNOSIS — E782 Mixed hyperlipidemia: Secondary | ICD-10-CM | POA: Diagnosis not present

## 2016-10-06 LAB — POCT GLYCOSYLATED HEMOGLOBIN (HGB A1C)

## 2016-10-06 MED ORDER — GLIPIZIDE 5 MG PO TABS: 5.0000 mg | ORAL_TABLET | Freq: Two times a day (BID) | ORAL | 3 refills | Status: DC

## 2016-10-06 MED ORDER — LISINOPRIL 20 MG PO TABS: 20.0000 mg | ORAL_TABLET | Freq: Every day | ORAL | 3 refills | Status: DC

## 2016-10-06 NOTE — Progress Notes (Signed)
Subjective:     Patient ID: Martha [name], female   DOB: 1946-02-21, 71 y.o.   MRN: [id number]  HPI  Chief Complaint  Patient presents with  . Diabetes    Patient comes in office todayt for 3 month follow up last office visit was 07/06/16 HgbA1C in house was 7.5%, patient was instructed to continue Glipizide 5mg . Patient reports today that she has been off medication since last visit, she has been following a well balanced diet and staying active walking daily.   . Chronic Kidney Disease    Patient returns for follow up last office visit was 07/06/16. Patients GFR 30-51min  . Hypothyroidism    Follow up from 07/06/16  . Muscle Pain    Patient reports pain in both her legs for the past several weeks, patent states that she is unsure if pain is related to chemo therapy or not.   Reports she is not taking blood pressure medication or the glipizide rx'ed at last visit. Hematology following her metabolic profile with stable CKD. States she walks around her complex for 10 minutes daily and rides her 3 wheeled bike at times. Accompanied by a friend/driver, [name].   Review of Systems  Respiratory: Negative for shortness of breath.   Cardiovascular: Negative for chest pain and palpitations.       Objective:   Physical Exam  Constitutional: She appears well-developed and well-nourished. No distress.  Cardiovascular: Normal rate and regular rhythm.   Pulmonary/Chest: Breath sounds normal.  Musculoskeletal: She exhibits no edema (of lower extremities).  Muscle strength in lower extremities 5/5. SLR's to 90 degrees without back pain or radiation of pain       Assessment:    1. CKD (chronic kidney disease) stage 3, GFR 30-59 ml/min  2. Mixed hyperlipidemia: will hold pravastatin for 2 weeks and see if aches improve.  3. Adult hypothyroidism - TSH - T4, free  4. Type 2 diabetes mellitus with stage 3 chronic kidney disease, without long-term current use of insulin ([place]): resume  medication - glipiZIDE (GLUCOTROL) 5 MG tablet; Take 1 tablet (5 mg total) by mouth 2 (two) times daily before a meal. Take 30 minutes prior to the meal.  Dispense: 60 tablet; Refill: 3 - POCT glycosylated hemoglobin (Hb A1C)  5. Hypertension associated with diabetes ([place]): resume medication - lisinopril (PRINIVIL,ZESTRIL) 20 MG tablet; Take 1 tablet (20 mg total) by mouth daily.  Dispense: 90 tablet; Refill: 3    Plan:    Further f/u pending lab work.

## 2016-10-06 NOTE — Patient Instructions (Signed)
We will call you with the thyroid lab results. Resume glipizide diabetes medication. Resume blood pressure medication-lisinopril. Don't take pravastatin cholesterol medication for two weeks and see if muscle aches subside.

## 2016-10-07 ENCOUNTER — Telehealth: Payer: Self-pay

## 2016-10-07 LAB — T4, FREE: Free T4: 1.44 ng/dL (ref 0.82–1.77)

## 2016-10-07 LAB — TSH: TSH: 0.27 u[IU]/mL — AB (ref 0.450–4.500)

## 2016-10-07 NOTE — Telephone Encounter (Signed)
-----   Message from Carmon Ginsberg, [place] sent at 10/07/2016  7:50 AM EST ----- Thyroid levels improving-would leave you at present dose for now.

## 2016-10-07 NOTE — Telephone Encounter (Signed)
Was unable to reach patient at this time, phone line continually rang will try and contact patient again at later time. KW

## 2016-10-08 ENCOUNTER — Other Ambulatory Visit: Payer: Self-pay | Admitting: Family Medicine

## 2016-10-08 DIAGNOSIS — B029 Zoster without complications: Secondary | ICD-10-CM

## 2016-10-09 NOTE — Telephone Encounter (Signed)
Patient was advised. KW 

## 2016-10-14 ENCOUNTER — Inpatient Hospital Stay: Payer: Medicare Other | Attending: Internal Medicine | Admitting: Internal Medicine

## 2016-10-14 ENCOUNTER — Inpatient Hospital Stay: Payer: Medicare Other

## 2016-10-14 ENCOUNTER — Other Ambulatory Visit: Payer: Self-pay | Admitting: Internal Medicine

## 2016-10-14 ENCOUNTER — Ambulatory Visit
Admission: RE | Admit: 2016-10-14 | Discharge: 2016-10-14 | Disposition: A | Discharge status: Home / Self Care | Payer: Medicare Other | Source: Ambulatory Visit | Attending: Internal Medicine | Admitting: Internal Medicine

## 2016-10-14 VITALS — BP 112/78 | HR 94 | Temp 96.5°F | Resp 18 | Wt 158.0 lb

## 2016-10-14 DIAGNOSIS — Z85850 Personal history of malignant neoplasm of thyroid: Secondary | ICD-10-CM | POA: Diagnosis not present

## 2016-10-14 DIAGNOSIS — M25551 Pain in right hip: Secondary | ICD-10-CM

## 2016-10-14 DIAGNOSIS — Z7984 Long term (current) use of oral hypoglycemic drugs: Secondary | ICD-10-CM | POA: Diagnosis not present

## 2016-10-14 DIAGNOSIS — E785 Hyperlipidemia, unspecified: Secondary | ICD-10-CM | POA: Insufficient documentation

## 2016-10-14 DIAGNOSIS — R937 Abnormal findings on diagnostic imaging of other parts of musculoskeletal system: Secondary | ICD-10-CM | POA: Insufficient documentation

## 2016-10-14 DIAGNOSIS — G40909 Epilepsy, unspecified, not intractable, without status epilepticus: Secondary | ICD-10-CM | POA: Insufficient documentation

## 2016-10-14 DIAGNOSIS — Z87891 Personal history of nicotine dependence: Secondary | ICD-10-CM | POA: Diagnosis not present

## 2016-10-14 DIAGNOSIS — C712 Malignant neoplasm of temporal lobe: Secondary | ICD-10-CM | POA: Diagnosis not present

## 2016-10-14 DIAGNOSIS — C73 Malignant neoplasm of thyroid gland: Secondary | ICD-10-CM

## 2016-10-14 DIAGNOSIS — Z923 Personal history of irradiation: Secondary | ICD-10-CM

## 2016-10-14 DIAGNOSIS — E119 Type 2 diabetes mellitus without complications: Secondary | ICD-10-CM | POA: Insufficient documentation

## 2016-10-14 DIAGNOSIS — C719 Malignant neoplasm of brain, unspecified: Secondary | ICD-10-CM

## 2016-10-14 DIAGNOSIS — Z79899 Other long term (current) drug therapy: Secondary | ICD-10-CM

## 2016-10-14 DIAGNOSIS — I10 Essential (primary) hypertension: Secondary | ICD-10-CM | POA: Insufficient documentation

## 2016-10-14 DIAGNOSIS — E890 Postprocedural hypothyroidism: Secondary | ICD-10-CM

## 2016-10-14 DIAGNOSIS — F329 Major depressive disorder, single episode, unspecified: Secondary | ICD-10-CM | POA: Diagnosis not present

## 2016-10-14 LAB — CBC WITH DIFFERENTIAL/PLATELET
BASOS ABS: 0.1 10*3/uL (ref 0–0.1)
BASOS PCT: 1 %
EOS ABS: 0.2 10*3/uL (ref 0–0.7)
Eosinophils Relative: 2 %
HEMATOCRIT: 44.2 % (ref 35.0–47.0)
HEMOGLOBIN: 15.2 g/dL (ref 12.0–16.0)
Lymphocytes Relative: 20 %
Lymphs Abs: 1.9 10*3/uL (ref 1.0–3.6)
MCH: 31.1 pg (ref 26.0–34.0)
MCHC: 34.2 g/dL (ref 32.0–36.0)
MCV: 90.7 fL (ref 80.0–100.0)
MONOS PCT: 7 %
Monocytes Absolute: 0.7 10*3/uL (ref 0.2–0.9)
NEUTROS ABS: 6.5 10*3/uL (ref 1.4–6.5)
NEUTROS PCT: 70 %
Platelets: 241 10*3/uL (ref 150–440)
RBC: 4.88 MIL/uL (ref 3.80–5.20)
RDW: 12.7 % (ref 11.5–14.5)
WBC: 9.3 10*3/uL (ref 3.6–11.0)

## 2016-10-14 LAB — COMPREHENSIVE METABOLIC PANEL
ALBUMIN: 4.7 g/dL (ref 3.5–5.0)
ALK PHOS: 101 U/L (ref 38–126)
ALT: 46 U/L (ref 14–54)
ANION GAP: 9 (ref 5–15)
AST: 36 U/L (ref 15–41)
BUN: 25 mg/dL — AB (ref 6–20)
CALCIUM: 9.7 mg/dL (ref 8.9–10.3)
CO2: 30 mmol/L (ref 22–32)
Chloride: 102 mmol/L (ref 101–111)
Creatinine, Ser: 1.23 mg/dL — ABNORMAL HIGH (ref 0.44–1.00)
GFR calc Af Amer: 50 mL/min — ABNORMAL LOW (ref >60)
GFR calc non Af Amer: 43 mL/min — ABNORMAL LOW (ref >60)
GLUCOSE: 148 mg/dL — AB (ref 65–99)
Potassium: 3.5 mmol/L (ref 3.5–5.1)
SODIUM: 141 mmol/L (ref 135–145)
Total Bilirubin: 1.3 mg/dL — ABNORMAL HIGH (ref 0.3–1.2)
Total Protein: 7.2 g/dL (ref 6.5–8.1)

## 2016-10-14 MED ORDER — LEVOTHYROXINE SODIUM 100 MCG PO TABS: 100.0000 ug | ORAL_TABLET | Freq: Every day | ORAL | 3 refills | Status: DC

## 2016-10-14 NOTE — Progress Notes (Signed)
[place] OFFICE PROGRESS NOTE  Patient Care Team: Carmon Ginsberg, [place] as PCP - General (Family Medicine)  No matching staging information was found for the patient.   Oncology History   1. 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 (January, 2017; s/p resection Dr.Nundkumar; [place]) 2 starting radiation therapy and Temodar January of 2017 3.She has finished radiation therapy in April of 2017 and also finished Temodar for approximately 6 weeks 4.Patient started on maintenance Temodar from Jan 03, 2016; July 2017- MRI no recurrence; left hemisphere- whitish plaques ? 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    #Thyroid cancer s/p Thyroidectomy [s/p RAUI; 1967]     Glioblastoma determined by biopsy of brain ([place])   08/27/2015 Initial Diagnosis    Glioblastoma determined by biopsy of brain ([place])         INTERVAL HISTORY:  Martha [name] 71 y.o.  female pleasant patient above history of GBM right frontal status post resection and currently on adjuvant Temodar- Is here for follow-up.   Patient complains of nausea/ vomiting the first few days of the Temodar cycle. Also complains of right hip pain which woke her up at night on few occasions. Not any worse with movement. Denies any swelling in the leg. She is also intermittent headaches especially during episodes of nausea while on Temodar. Again impressed with Tylenol.  Continues to complain of tremors most of her right hand. Had recent decrease in the dose of Synthroid.  Otherwise denies any seizures. No new weaknesses. Denies any unusual chest pain or shortness of breath or cough.  She walks with a cane. No falls.   REVIEW OF SYSTEMS:  A complete 10 point review of system is done which is negative except mentioned above/history of present illness.   PAST MEDICAL HISTORY :  Past Medical History:  Diagnosis Date  . Cancer  [facility])    thyroid cancer treated with radioactive iodine  . Depression   . Diabetes mellitus without complication ([place])   . Epilepsy ([place])   . Glioblastoma determined by biopsy of brain ([place])   . Hyperlipidemia   . Hypertension   . Thyroid disease    h/o thyroid ca- 30 years ago  . Varicose vein     PAST SURGICAL HISTORY :   Past Surgical History:  Procedure Laterality Date  . APPLICATION OF CRANIAL NAVIGATION N/A 08/02/2015   Procedure: APPLICATION OF CRANIAL NAVIGATION;  Surgeon: Consuella Lose, MD;  Location: [place] NEURO ORS;  Service: Neurosurgery;  Laterality: N/A;  APPLICATION OF CRANIAL NAVIGATION  . BILATERAL CARPAL TUNNEL RELEASE  1990,1991   left hand in 1990 right hand in '91  . CERVICAL SPINE SURGERY  05/1997   spinal fusion  . CHOLECYSTECTOMY  04/24/2008   status post laparoscopic Dr.Ely  . CRANIOTOMY Right 08/02/2015   Procedure: CRANIOTOMY TUMOR EXCISION;  Surgeon: Consuella Lose, MD;  Location: [place] NEURO ORS;  Service: Neurosurgery;  Laterality: Right;  CRANIOTOMY TUMOR EXCISION  . radioactive iodine    . THYROIDECTOMY, PARTIAL  1964   due to cancerous tumor  . TOE SURGERY Left 1989    FAMILY HISTORY :   Family History  Problem Relation Age of Onset  . Arthritis Mother   . Hyperlipidemia Mother   . Hypertension Mother   . Heart disease Mother   . Diabetes Mother     type 2  . Thyroid disease Mother   . Cancer Father  colon  . Alcohol abuse Brother   . Hyperlipidemia Brother   . Hypertension Brother   . Breast cancer Neg Hx   . Ovarian cancer Neg Hx     SOCIAL HISTORY:   Social History  Substance Use Topics  . Smoking status: Former Research scientist (life sciences)  . Smokeless tobacco: Never Used     Comment: quit smoking in 2007  . Alcohol use No    ALLERGIES:  is allergic to ampicillin; fish-derived products; tomato; and penicillins.  MEDICATIONS:  Current Outpatient Prescriptions  Medication Sig Dispense Refill  . colchicine (COLCRYS) 0.6 MG tablet One  pill ever 2-3 days for gout prevention 45 tablet 3  . glipiZIDE (GLUCOTROL) 5 MG tablet Take 1 tablet (5 mg total) by mouth 2 (two) times daily before a meal. Take 30 minutes prior to the meal. 60 tablet 3  . levETIRAcetam (KEPPRA) 500 MG tablet Take 1 tablet (500 mg total) by mouth 2 (two) times daily. Reported on 12/11/2015    . levothyroxine (SYNTHROID, LEVOTHROID) 100 MCG tablet Take 1 tablet (100 mcg total) by mouth daily. 30 tablet 3  . lisinopril (PRINIVIL,ZESTRIL) 20 MG tablet Take 1 tablet (20 mg total) by mouth daily. 90 tablet 3  . nortriptyline (PAMELOR) 10 MG capsule TAKE ONE TO THREE CAPSULES AT BEDTIME TO HELP WITH SLEEP AND NERVE PAIN 30 capsule 0  . pravastatin (PRAVACHOL) 40 MG tablet TAKE 1 TABLET BY MOUTH EVERY DAY 90 tablet 1  . promethazine (PHENERGAN) 25 MG tablet TAKE 1 TABLET (25 MG TOTAL) BY MOUTH EVERY 6 (SIX) HOURS AS NEEDED FOR NAUSEA OR VOMITING. 30 tablet 0  . temozolomide (TEMODAR) 250 MG capsule Take 1 capsule by mouth for 5 days, repeat every 4 weeks. 5 capsule 3   No current facility-administered medications for this visit.     PHYSICAL EXAMINATION: ECOG PERFORMANCE STATUS: 2 - Symptomatic, <50% confined to bed  BP 112/78 (BP Location: Left Arm, Patient Position: Sitting)   Pulse 94   Temp (!) 96.5 F (35.8 C) (Tympanic)   Resp 18   Wt 158 lb (71.7 kg)   BMI 29.85 kg/m   Filed Weights   10/14/16 1147  Weight: 158 lb (71.7 kg)    GENERAL: Well-nourished well-developed; Alert, no distress and comfortable.Walks with a cane. She is accompanied by her friend.  EYES: no pallor or icterus OROPHARYNX: no thrush or ulceration; good dentition  NECK: supple, no masses felt LYMPH:  no palpable lymphadenopathy in the cervical, axillary or inguinal regions LUNGS: clear to auscultation and  No wheeze or crackles HEART/CVS: regular rate & rhythm and no murmurs; No lower extremity edema ABDOMEN:abdomen soft, non-tender and normal bowel sounds Musculoskeletal:no  cyanosis of digits and no clubbing  PSYCH: alert & oriented x 3 with fluent speech NEURO: no focal motor/sensory deficits; fine tremors of bilateral upper extremities. SKIN:  no rashes or significant lesions; scabbing rash noted around the shoulder and the right chest wall.  LABORATORY DATA:  I have reviewed the data as listed    Component Value Date/Time   NA 141 10/14/2016 1110   NA 148 (H) 11/14/2015 1623   NA 142 11/14/2014 1506   K 3.5 10/14/2016 1110   K 3.8 11/14/2014 1506   CL 102 10/14/2016 1110   CL 110 11/14/2014 1506   CO2 30 10/14/2016 1110   CO2 23 11/14/2014 1506   GLUCOSE 148 (H) 10/14/2016 1110   GLUCOSE 104 (H) 11/14/2014 1506   BUN 25 (H) 10/14/2016 1110  BUN 13 11/14/2015 1623   BUN 31 (H) 11/14/2014 1506   CREATININE 1.23 (H) 10/14/2016 1110   CREATININE 1.08 (H) 11/14/2014 1506   CALCIUM 9.7 10/14/2016 1110   CALCIUM 9.5 11/14/2014 1506   PROT 7.2 10/14/2016 1110   ALBUMIN 4.7 10/14/2016 1110   ALBUMIN 3.9 11/14/2015 1623   AST 36 10/14/2016 1110   ALT 46 10/14/2016 1110   ALKPHOS 101 10/14/2016 1110   BILITOT 1.3 (H) 10/14/2016 1110   GFRNONAA 43 (L) 10/14/2016 1110   GFRNONAA 53 (L) 11/14/2014 1506   GFRAA 50 (L) 10/14/2016 1110   GFRAA >60 11/14/2014 1506    No results found for: SPEP, UPEP  Lab Results  Component Value Date   WBC 9.3 10/14/2016   NEUTROABS 6.5 10/14/2016   HGB 15.2 10/14/2016   HCT 44.2 10/14/2016   MCV 90.7 10/14/2016   PLT 241 10/14/2016      Chemistry      Component Value Date/Time   NA 141 10/14/2016 1110   NA 148 (H) 11/14/2015 1623   NA 142 11/14/2014 1506   K 3.5 10/14/2016 1110   K 3.8 11/14/2014 1506   CL 102 10/14/2016 1110   CL 110 11/14/2014 1506   CO2 30 10/14/2016 1110   CO2 23 11/14/2014 1506   BUN 25 (H) 10/14/2016 1110   BUN 13 11/14/2015 1623   BUN 31 (H) 11/14/2014 1506   CREATININE 1.23 (H) 10/14/2016 1110   CREATININE 1.08 (H) 11/14/2014 1506      Component Value Date/Time   CALCIUM  9.7 10/14/2016 1110   CALCIUM 9.5 11/14/2014 1506   ALKPHOS 101 10/14/2016 1110   AST 36 10/14/2016 1110   ALT 46 10/14/2016 1110   BILITOT 1.3 (H) 10/14/2016 1110     IMPRESSION: 1. Unchanged appearance of right temporal lobe resection site and mild surrounding white matter T2 signal abnormality. 2. Unchanged scattered foci of enhancement and mineralization predominantly in the left cerebral hemisphere, indeterminate but may reflect treated thyroid metastases or prior infection.   Electronically Signed   By: Logan Bores M.D.   On: 09/04/2016 12:45  RADIOGRAPHIC STUDIES: I have personally reviewed the radiological images as listed and agreed with the findings in the report. No results found.   ASSESSMENT & PLAN:  Glioblastoma determined by biopsy of brain [facility]) Status post resection followed by concurrent chemoradiation-currently on cycle # 7  of adjuvant therapy. JAN 31st 2018- MRI shows no definite evidence of recurrence in the resection cavity; slight enhancement noted around the cavity; STABLE.   # Clinically no evidence of progression.  Continue Temodar as planned; however,cut down the duration of Temodar to 4 days instead of 5 days every month.  # Nausea/ vomiting- continue when necessary antiemetics. See above.  # right hip/LE- pain x2 weeks- clinically not DVT. Recommend checking hip x-rays. Question arthritis versus bursitis.  # Elevated creatinine-1.3; recommend increased hydration.  # History of thyroid cancer/ but tremors decrease the dose of synthroid to 167mcg sec to tremors; repeat labs in 4 weeks. If not improved recommend referral to neurology.  # follow up in 4 weeks/ labs/TSH profile.    Orders Placed This Encounter  Procedures  . DG HIP UNILAT W OR W/O PELVIS 2-3 VIEWS RIGHT    Standing Status:   Future    Number of Occurrences:   1    Standing Expiration Date:   12/14/2017    Order Specific Question:   Reason for Exam (SYMPTOM  OR DIAGNOSIS  REQUIRED)    Answer:   right hip pain    Order Specific Question:   Preferred imaging location?    Answer:   [facility]  . CBC with Differential    Standing Status:   Future    Standing Expiration Date:   10/14/2017  . Comprehensive metabolic panel    Standing Status:   Future    Standing Expiration Date:   10/14/2017      Cammie Sickle, MD 10/14/2016 1:21 PM

## 2016-10-14 NOTE — Assessment & Plan Note (Addendum)
Status post resection followed by concurrent chemoradiation-currently on cycle # 7  of adjuvant therapy. JAN 31st 2018- MRI shows no definite evidence of recurrence in the resection cavity; slight enhancement noted around the cavity; STABLE.   # Clinically no evidence of progression.  Continue Temodar as planned; however,cut down the duration of Temodar to 4 days instead of 5 days every month.  # Nausea/ vomiting- continue when necessary antiemetics. See above.  # right hip/LE- pain x2 weeks- clinically not DVT. Recommend checking hip x-rays. Question arthritis versus bursitis.  # Elevated creatinine-1.3; recommend increased hydration.  # History of thyroid cancer/ but tremors decrease the dose of synthroid to 136mcg sec to tremors; repeat labs in 4 weeks. If not improved recommend referral to neurology.  # follow up in 4 weeks/ labs/TSH profile.

## 2016-10-14 NOTE — Progress Notes (Signed)
Patient here today for follow up.  Patient c/o right thigh pain and pain in the soles of her feet.

## 2016-10-23 ENCOUNTER — Ambulatory Visit (INDEPENDENT_AMBULATORY_CARE_PROVIDER_SITE_OTHER): Payer: Medicare Other | Admitting: Family Medicine

## 2016-10-23 ENCOUNTER — Encounter: Payer: Self-pay | Admitting: Family Medicine

## 2016-10-23 VITALS — BP 116/74 | HR 88 | Temp 98.4°F | Resp 16 | Wt 164.0 lb

## 2016-10-23 DIAGNOSIS — L03031 Cellulitis of right toe: Secondary | ICD-10-CM | POA: Diagnosis not present

## 2016-10-23 MED ORDER — CLINDAMYCIN HCL 300 MG PO CAPS: 300.0000 mg | ORAL_CAPSULE | Freq: Three times a day (TID) | ORAL | 0 refills | Status: DC

## 2016-10-23 NOTE — Progress Notes (Signed)
Subjective:     Patient ID: Martha [name], female   DOB: 04/15/1946, 71 y.o.   MRN: [id number]  HPI  Chief Complaint  Patient presents with  . Ingrown Toenail    Pt reports it is infected and needs an antibiotic before her appointment with podiatry next week.   States she was cutting her toenails too closely when infection developed in her right first toe two weeks ago. Since then has used Epsom salt soaks but reinjured it a few days ago when she stubbed her toe.   Review of Systems     Objective:   Physical Exam  Constitutional: She appears well-developed and well-nourished. She appears distressed (moderate toe tenderness).  Skin:  Right first toe with distal erythema and swelling with pus pocket adjacent to the end of her nail where irregularly cut. Procedure Note: Toe cleansed with alcohol and puncture made with tip of bayonet scalpel blade with modest amount of pus return.        Assessment:    1. Paronychia of great toe of right foot - clindamycin (CLEOCIN) 300 MG capsule; Take 1 capsule (300 mg total) by mouth 3 (three) times daily.  Dispense: 21 capsule; Refill: 0    Plan:    Continue bid salt water soaks and f/u with podiatry.

## 2016-10-23 NOTE — Patient Instructions (Signed)
Continue Epsom salt soaks twice daily for 5-10 minutes pending podiatry appointment

## 2016-10-30 DIAGNOSIS — L600 Ingrowing nail: Secondary | ICD-10-CM | POA: Diagnosis not present

## 2016-10-30 DIAGNOSIS — E119 Type 2 diabetes mellitus without complications: Secondary | ICD-10-CM | POA: Diagnosis not present

## 2016-10-30 DIAGNOSIS — L03031 Cellulitis of right toe: Secondary | ICD-10-CM | POA: Diagnosis not present

## 2016-10-31 ENCOUNTER — Other Ambulatory Visit: Payer: Self-pay | Admitting: Family Medicine

## 2016-11-13 ENCOUNTER — Ambulatory Visit: Payer: Medicare Other | Admitting: Internal Medicine

## 2016-11-13 ENCOUNTER — Other Ambulatory Visit: Payer: Medicare Other

## 2016-11-16 ENCOUNTER — Inpatient Hospital Stay: Payer: Medicare Other | Attending: Internal Medicine | Admitting: Internal Medicine

## 2016-11-16 ENCOUNTER — Inpatient Hospital Stay: Payer: Medicare Other

## 2016-11-16 VITALS — BP 135/89 | HR 94 | Temp 97.6°F | Resp 20 | Ht 61.0 in | Wt 162.0 lb

## 2016-11-16 DIAGNOSIS — I10 Essential (primary) hypertension: Secondary | ICD-10-CM | POA: Insufficient documentation

## 2016-11-16 DIAGNOSIS — Z923 Personal history of irradiation: Secondary | ICD-10-CM

## 2016-11-16 DIAGNOSIS — Z79899 Other long term (current) drug therapy: Secondary | ICD-10-CM | POA: Insufficient documentation

## 2016-11-16 DIAGNOSIS — Z85850 Personal history of malignant neoplasm of thyroid: Secondary | ICD-10-CM | POA: Insufficient documentation

## 2016-11-16 DIAGNOSIS — G40909 Epilepsy, unspecified, not intractable, without status epilepticus: Secondary | ICD-10-CM | POA: Insufficient documentation

## 2016-11-16 DIAGNOSIS — F329 Major depressive disorder, single episode, unspecified: Secondary | ICD-10-CM | POA: Diagnosis not present

## 2016-11-16 DIAGNOSIS — C719 Malignant neoplasm of brain, unspecified: Secondary | ICD-10-CM

## 2016-11-16 DIAGNOSIS — Z9221 Personal history of antineoplastic chemotherapy: Secondary | ICD-10-CM | POA: Diagnosis not present

## 2016-11-16 DIAGNOSIS — C712 Malignant neoplasm of temporal lobe: Secondary | ICD-10-CM

## 2016-11-16 DIAGNOSIS — Z87891 Personal history of nicotine dependence: Secondary | ICD-10-CM | POA: Insufficient documentation

## 2016-11-16 DIAGNOSIS — E785 Hyperlipidemia, unspecified: Secondary | ICD-10-CM | POA: Diagnosis not present

## 2016-11-16 DIAGNOSIS — E119 Type 2 diabetes mellitus without complications: Secondary | ICD-10-CM | POA: Diagnosis not present

## 2016-11-16 DIAGNOSIS — E890 Postprocedural hypothyroidism: Secondary | ICD-10-CM | POA: Insufficient documentation

## 2016-11-16 DIAGNOSIS — M25551 Pain in right hip: Secondary | ICD-10-CM | POA: Diagnosis not present

## 2016-11-16 DIAGNOSIS — C73 Malignant neoplasm of thyroid gland: Secondary | ICD-10-CM

## 2016-11-16 LAB — COMPREHENSIVE METABOLIC PANEL
ALT: 37 U/L (ref 14–54)
ANION GAP: 7 (ref 5–15)
AST: 25 U/L (ref 15–41)
Albumin: 4.3 g/dL (ref 3.5–5.0)
Alkaline Phosphatase: 89 U/L (ref 38–126)
BILIRUBIN TOTAL: 0.8 mg/dL (ref 0.3–1.2)
BUN: 26 mg/dL — ABNORMAL HIGH (ref 6–20)
CHLORIDE: 106 mmol/L (ref 101–111)
CO2: 26 mmol/L (ref 22–32)
Calcium: 9.6 mg/dL (ref 8.9–10.3)
Creatinine, Ser: 0.95 mg/dL (ref 0.44–1.00)
GFR calc Af Amer: >60 mL/min (ref >60)
GFR, EST NON AFRICAN AMERICAN: 59 mL/min — AB (ref >60)
Glucose, Bld: 193 mg/dL — ABNORMAL HIGH (ref 65–99)
POTASSIUM: 3.5 mmol/L (ref 3.5–5.1)
Sodium: 139 mmol/L (ref 135–145)
Total Protein: 7 g/dL (ref 6.5–8.1)

## 2016-11-16 LAB — CBC WITH DIFFERENTIAL/PLATELET
Basophils Absolute: 0.1 10*3/uL (ref 0–0.1)
Basophils Relative: 1 %
EOS PCT: 2 %
Eosinophils Absolute: 0.1 10*3/uL (ref 0–0.7)
HCT: 40.6 % (ref 35.0–47.0)
Hemoglobin: 14.5 g/dL (ref 12.0–16.0)
LYMPHS ABS: 1.2 10*3/uL (ref 1.0–3.6)
Lymphocytes Relative: 18 %
MCH: 31.5 pg (ref 26.0–34.0)
MCHC: 35.6 g/dL (ref 32.0–36.0)
MCV: 88.5 fL (ref 80.0–100.0)
MONO ABS: 0.4 10*3/uL (ref 0.2–0.9)
MONOS PCT: 7 %
Neutro Abs: 4.9 10*3/uL (ref 1.4–6.5)
Neutrophils Relative %: 72 %
PLATELETS: 220 10*3/uL (ref 150–440)
RBC: 4.59 MIL/uL (ref 3.80–5.20)
RDW: 13.2 % (ref 11.5–14.5)
WBC: 6.8 10*3/uL (ref 3.6–11.0)

## 2016-11-16 NOTE — Progress Notes (Signed)
Pt c/o chronic headaches on right temple. Pt reports chronic right muscle soreness in leg cramps. She c/o dry skin.

## 2016-11-16 NOTE — Progress Notes (Signed)
[place] OFFICE PROGRESS NOTE  Patient Care Team: Carmon Ginsberg, [place] as PCP - General (Family Medicine)  No matching staging information was found for the patient.   Oncology History   1. 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 (January, 2017; s/p resection Dr.Nundkumar; [place]) 2 starting radiation therapy and Temodar January of 2017 3.She has finished radiation therapy in April of 2017 and also finished Temodar for approximately 6 weeks 4.Patient started on maintenance Temodar from Jan 03, 2016; July 2017- MRI no recurrence; left hemisphere- whitish plaques ? 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    #Thyroid cancer s/p Thyroidectomy [s/p RAUI; 1967]     Glioblastoma determined by biopsy of brain ([place])   08/27/2015 Initial Diagnosis    Glioblastoma determined by biopsy of brain ([place])         INTERVAL HISTORY:  Martha [name] 71 y.o.  female pleasant patient above history of GBM right frontal status post resection and currently on adjuvant Temodar- Is here for follow-up.  She continues to complain of significant nausea especially the first day after taking her Temodar.  She also complains of pain behind the right eye 6 on a scale of 10. Denies any vision changes. Denies worsening movement. Patient has been taking Pamelor at nighttime for the nausea and neuropathic pain.  Tremors in the upper extremities improved. Otherwise denies any seizures. No new weaknesses. Denies any unusual chest pain or shortness of breath or cough.  She walks with a cane. No falls. She also complains of mild pain in the right thigh. Worse with movement.  REVIEW OF SYSTEMS:  A complete 10 point review of system is done which is negative except mentioned above/history of present illness.   PAST MEDICAL HISTORY :  Past Medical History:  Diagnosis Date  . Cancer [facility])    thyroid cancer  treated with radioactive iodine  . Depression   . Diabetes mellitus without complication ([place])   . Epilepsy ([place])   . Glioblastoma determined by biopsy of brain ([place])   . Hyperlipidemia   . Hypertension   . Thyroid disease    h/o thyroid ca- 30 years ago  . Varicose vein     PAST SURGICAL HISTORY :   Past Surgical History:  Procedure Laterality Date  . APPLICATION OF CRANIAL NAVIGATION N/A 08/02/2015   Procedure: APPLICATION OF CRANIAL NAVIGATION;  Surgeon: Consuella Lose, MD;  Location: [place] NEURO ORS;  Service: Neurosurgery;  Laterality: N/A;  APPLICATION OF CRANIAL NAVIGATION  . BILATERAL CARPAL TUNNEL RELEASE  1990,1991   left hand in 1990 right hand in '91  . CERVICAL SPINE SURGERY  05/1997   spinal fusion  . CHOLECYSTECTOMY  04/24/2008   status post laparoscopic Dr.Ely  . CRANIOTOMY Right 08/02/2015   Procedure: CRANIOTOMY TUMOR EXCISION;  Surgeon: Consuella Lose, MD;  Location: [place] NEURO ORS;  Service: Neurosurgery;  Laterality: Right;  CRANIOTOMY TUMOR EXCISION  . radioactive iodine    . THYROIDECTOMY, PARTIAL  1964   due to cancerous tumor  . TOE SURGERY Left 1989    FAMILY HISTORY :   Family History  Problem Relation Age of Onset  . Arthritis Mother   . Hyperlipidemia Mother   . Hypertension Mother   . Heart disease Mother   . Diabetes Mother     type 2  . Thyroid disease Mother   . Cancer Father     colon  .  Alcohol abuse Brother   . Hyperlipidemia Brother   . Hypertension Brother   . Breast cancer Neg Hx   . Ovarian cancer Neg Hx     SOCIAL HISTORY:   Social History  Substance Use Topics  . Smoking status: Former Research scientist (life sciences)  . Smokeless tobacco: Never Used     Comment: quit smoking in 2007  . Alcohol use No    ALLERGIES:  is allergic to ampicillin; fish-derived products; tomato; and penicillins.  MEDICATIONS:  Current Outpatient Prescriptions  Medication Sig Dispense Refill  . acetaminophen (TYLENOL) 500 MG tablet Take 500 mg by mouth every 6  (six) hours as needed for mild pain or moderate pain.    Marland Kitchen COLCRYS 0.6 MG tablet TAKE ONE PILL EVER 2-3 DAYS FOR GOUT PREVENTION 45 tablet 3  . levETIRAcetam (KEPPRA) 500 MG tablet Take 1 tablet (500 mg total) by mouth 2 (two) times daily. Reported on 12/11/2015    . levothyroxine (SYNTHROID, LEVOTHROID) 100 MCG tablet Take 1 tablet (100 mcg total) by mouth daily. 30 tablet 3  . lisinopril (PRINIVIL,ZESTRIL) 20 MG tablet Take 1 tablet (20 mg total) by mouth daily. 90 tablet 3  . nortriptyline (PAMELOR) 10 MG capsule TAKE ONE TO THREE CAPSULES AT BEDTIME TO HELP WITH SLEEP AND NERVE PAIN 30 capsule 0  . pravastatin (PRAVACHOL) 40 MG tablet TAKE 1 TABLET BY MOUTH EVERY DAY 90 tablet 1  . promethazine (PHENERGAN) 25 MG tablet TAKE 1 TABLET (25 MG TOTAL) BY MOUTH EVERY 6 (SIX) HOURS AS NEEDED FOR NAUSEA OR VOMITING. 30 tablet 0  . temozolomide (TEMODAR) 250 MG capsule Take 1 capsule by mouth for 5 days, repeat every 4 weeks. (Patient taking differently: Take 1 capsule by mouth for 4 days, repeat every 4 weeks.) 5 capsule 3   No current facility-administered medications for this visit.     PHYSICAL EXAMINATION: ECOG PERFORMANCE STATUS: 2 - Symptomatic, <50% confined to bed  BP 135/89 (BP Location: Left Arm, Patient Position: Sitting)   Pulse 94   Temp 97.6 F (36.4 C) (Tympanic)   Resp 20   Ht 5\' 1"  (1.549 m)   Wt 162 lb (73.5 kg)   BMI 30.61 kg/m   Filed Weights   11/16/16 1418  Weight: 162 lb (73.5 kg)    GENERAL: Well-nourished well-developed; Alert, no distress and comfortable.Walks with a cane. She is accompanied by her friend.  EYES: no pallor or icterus OROPHARYNX: no thrush or ulceration; good dentition  NECK: supple, no masses felt LYMPH:  no palpable lymphadenopathy in the cervical, axillary or inguinal regions LUNGS: clear to auscultation and  No wheeze or crackles HEART/CVS: regular rate & rhythm and no murmurs; No lower extremity edema ABDOMEN:abdomen soft, non-tender and  normal bowel sounds Musculoskeletal:no cyanosis of digits and no clubbing  PSYCH: alert & oriented x 3 with fluent speech NEURO: no focal motor/sensory deficits; fine tremors of bilateral upper extremities. SKIN:  no rashes or significant lesions; scabbing rash noted around the shoulder and the right chest wall.  LABORATORY DATA:  I have reviewed the data as listed    Component Value Date/Time   NA 139 11/16/2016 1355   NA 148 (H) 11/14/2015 1623   NA 142 11/14/2014 1506   K 3.5 11/16/2016 1355   K 3.8 11/14/2014 1506   CL 106 11/16/2016 1355   CL 110 11/14/2014 1506   CO2 26 11/16/2016 1355   CO2 23 11/14/2014 1506   GLUCOSE 193 (H) 11/16/2016 1355   GLUCOSE 104 (  H) 11/14/2014 1506   BUN 26 (H) 11/16/2016 1355   BUN 13 11/14/2015 1623   BUN 31 (H) 11/14/2014 1506   CREATININE 0.95 11/16/2016 1355   CREATININE 1.08 (H) 11/14/2014 1506   CALCIUM 9.6 11/16/2016 1355   CALCIUM 9.5 11/14/2014 1506   PROT 7.0 11/16/2016 1355   ALBUMIN 4.3 11/16/2016 1355   ALBUMIN 3.9 11/14/2015 1623   AST 25 11/16/2016 1355   ALT 37 11/16/2016 1355   ALKPHOS 89 11/16/2016 1355   BILITOT 0.8 11/16/2016 1355   GFRNONAA 59 (L) 11/16/2016 1355   GFRNONAA 53 (L) 11/14/2014 1506   GFRAA >60 11/16/2016 1355   GFRAA >60 11/14/2014 1506    No results found for: SPEP, UPEP  Lab Results  Component Value Date   WBC 6.8 11/16/2016   NEUTROABS 4.9 11/16/2016   HGB 14.5 11/16/2016   HCT 40.6 11/16/2016   MCV 88.5 11/16/2016   PLT 220 11/16/2016      Chemistry      Component Value Date/Time   NA 139 11/16/2016 1355   NA 148 (H) 11/14/2015 1623   NA 142 11/14/2014 1506   K 3.5 11/16/2016 1355   K 3.8 11/14/2014 1506   CL 106 11/16/2016 1355   CL 110 11/14/2014 1506   CO2 26 11/16/2016 1355   CO2 23 11/14/2014 1506   BUN 26 (H) 11/16/2016 1355   BUN 13 11/14/2015 1623   BUN 31 (H) 11/14/2014 1506   CREATININE 0.95 11/16/2016 1355   CREATININE 1.08 (H) 11/14/2014 1506      Component  Value Date/Time   CALCIUM 9.6 11/16/2016 1355   CALCIUM 9.5 11/14/2014 1506   ALKPHOS 89 11/16/2016 1355   AST 25 11/16/2016 1355   ALT 37 11/16/2016 1355   BILITOT 0.8 11/16/2016 1355     IMPRESSION: 1. Unchanged appearance of right temporal lobe resection site and mild surrounding white matter T2 signal abnormality. 2. Unchanged scattered foci of enhancement and mineralization predominantly in the left cerebral hemisphere, indeterminate but may reflect treated thyroid metastases or prior infection.   Electronically Signed   By: Logan Bores M.D.   On: 09/04/2016 12:45  RADIOGRAPHIC STUDIES: I have personally reviewed the radiological images as listed and agreed with the findings in the report. No results found.   ASSESSMENT & PLAN:  Glioblastoma determined by biopsy of brain [facility]) Status post resection followed by concurrent chemoradiation-currently on cycle # 8 of adjuvant therapy. JAN 31st 2018- MRI shows no definite evidence of recurrence in the resection cavity; slight enhancement noted around the cavity; STABLE.   # Clinically no evidence of progression.  HOLD temodar this cycle [see discussion below]; will order MRI at next visit.   # right frontal headaches/ behind right eye- ? tyelenol prn; STOP pamelor ? Glaucoma; recommend evaluation with ophthalmologist.   # right hip/LE- pain x2 weeks-improved.   # Elevated creatinine-1.3; today 0.95/ improved.   # History of thyroid cancer/ but tremors decrease the dose of synthroid to 174mcg sec to tremors; improved. Continue current dose. TSH today pending.   # follow up in 4 weeks/ labs.   Orders Placed This Encounter  Procedures  . Comprehensive metabolic panel    Standing Status:   Future    Standing Expiration Date:   11/16/2017  . CBC with Differential    Standing Status:   Future    Standing Expiration Date:   11/16/2017      Cammie Sickle, MD 11/16/2016 5:06 PM

## 2016-11-16 NOTE — Assessment & Plan Note (Addendum)
Status post resection followed by concurrent chemoradiation-currently on cycle # 8 of adjuvant therapy. JAN 31st 2018- MRI shows no definite evidence of recurrence in the resection cavity; slight enhancement noted around the cavity; STABLE.   # Clinically no evidence of progression.  HOLD temodar this cycle [see discussion below]; will order MRI at next visit.   # right frontal headaches/ behind right eye- ? tyelenol prn; STOP pamelor ? Glaucoma; recommend evaluation with ophthalmologist.   # right hip/LE- pain x2 weeks-improved.   # Elevated creatinine-1.3; today 0.95/ improved.   # History of thyroid cancer/ but tremors decrease the dose of synthroid to 119mcg sec to tremors; improved. Continue current dose. TSH today pending.   # follow up in 4 weeks/ labs.

## 2016-11-17 LAB — THYROID PANEL WITH TSH
FREE THYROXINE INDEX: 2.3 (ref 1.2–4.9)
T3 UPTAKE RATIO: 27 % (ref 24–39)
T4 TOTAL: 8.5 ug/dL (ref 4.5–12.0)
TSH: 0.547 u[IU]/mL (ref 0.450–4.500)

## 2016-11-23 ENCOUNTER — Other Ambulatory Visit: Payer: Self-pay | Admitting: Family Medicine

## 2016-11-23 DIAGNOSIS — E1169 Type 2 diabetes mellitus with other specified complication: Secondary | ICD-10-CM

## 2016-11-23 DIAGNOSIS — E785 Hyperlipidemia, unspecified: Principal | ICD-10-CM

## 2016-11-23 MED ORDER — PRAVASTATIN SODIUM 40 MG PO TABS: 40.0000 mg | ORAL_TABLET | Freq: Every day | ORAL | 1 refills | Status: DC

## 2016-11-30 ENCOUNTER — Telehealth: Payer: Self-pay | Admitting: Family Medicine

## 2016-11-30 NOTE — Telephone Encounter (Signed)
According to chart Dr. Rogue Bussing last filled prescription on 10/14/16 with 3 additional refills. KW

## 2016-11-30 NOTE — Telephone Encounter (Signed)
[facility] faxed a request on the following medication. Thanks CC  levothyroxine (SYNTHROID, LEVOTHROID) 100 MCG tablet  Take 1 tablet (125 MCG Total ) by mouth daily before Breakfast.

## 2016-11-30 NOTE — Telephone Encounter (Signed)
Please call [facility] and let them know the dose has been reduced to 100 mcg and has previously prescribed.

## 2016-12-01 NOTE — Telephone Encounter (Signed)
Spoke with pharmacist on the phone who stated that she did not know why received fax for refill request she states that patient picked up medication on 10/14/16 and again in the beginning of April. I clarified with pharmacist that dosage is suppose to bee 143mcg and ordering doctor was Dr. Rogue Bussing, which she confirmed in computer. KW

## 2016-12-14 ENCOUNTER — Inpatient Hospital Stay (HOSPITAL_BASED_OUTPATIENT_CLINIC_OR_DEPARTMENT_OTHER): Payer: Medicare Other | Admitting: Internal Medicine

## 2016-12-14 ENCOUNTER — Inpatient Hospital Stay: Payer: Medicare Other | Attending: Internal Medicine

## 2016-12-14 VITALS — BP 159/90 | HR 92 | Temp 97.2°F | Resp 20 | Ht 61.0 in | Wt 166.0 lb

## 2016-12-14 DIAGNOSIS — C712 Malignant neoplasm of temporal lobe: Secondary | ICD-10-CM | POA: Diagnosis not present

## 2016-12-14 DIAGNOSIS — Z13820 Encounter for screening for osteoporosis: Secondary | ICD-10-CM

## 2016-12-14 DIAGNOSIS — I10 Essential (primary) hypertension: Secondary | ICD-10-CM | POA: Diagnosis not present

## 2016-12-14 DIAGNOSIS — F329 Major depressive disorder, single episode, unspecified: Secondary | ICD-10-CM | POA: Diagnosis not present

## 2016-12-14 DIAGNOSIS — R51 Headache: Secondary | ICD-10-CM | POA: Insufficient documentation

## 2016-12-14 DIAGNOSIS — E785 Hyperlipidemia, unspecified: Secondary | ICD-10-CM | POA: Insufficient documentation

## 2016-12-14 DIAGNOSIS — Z923 Personal history of irradiation: Secondary | ICD-10-CM | POA: Diagnosis not present

## 2016-12-14 DIAGNOSIS — Z01419 Encounter for gynecological examination (general) (routine) without abnormal findings: Secondary | ICD-10-CM

## 2016-12-14 DIAGNOSIS — G40909 Epilepsy, unspecified, not intractable, without status epilepticus: Secondary | ICD-10-CM | POA: Insufficient documentation

## 2016-12-14 DIAGNOSIS — Z780 Asymptomatic menopausal state: Secondary | ICD-10-CM

## 2016-12-14 DIAGNOSIS — C719 Malignant neoplasm of brain, unspecified: Secondary | ICD-10-CM

## 2016-12-14 DIAGNOSIS — R112 Nausea with vomiting, unspecified: Secondary | ICD-10-CM

## 2016-12-14 DIAGNOSIS — Z85850 Personal history of malignant neoplasm of thyroid: Secondary | ICD-10-CM | POA: Insufficient documentation

## 2016-12-14 DIAGNOSIS — Z87891 Personal history of nicotine dependence: Secondary | ICD-10-CM | POA: Diagnosis not present

## 2016-12-14 DIAGNOSIS — E119 Type 2 diabetes mellitus without complications: Secondary | ICD-10-CM | POA: Diagnosis not present

## 2016-12-14 LAB — COMPREHENSIVE METABOLIC PANEL
ALT: 32 U/L (ref 14–54)
ANION GAP: 6 (ref 5–15)
AST: 22 U/L (ref 15–41)
Albumin: 3.8 g/dL (ref 3.5–5.0)
Alkaline Phosphatase: 97 U/L (ref 38–126)
BUN: 23 mg/dL — ABNORMAL HIGH (ref 6–20)
CHLORIDE: 107 mmol/L (ref 101–111)
CO2: 27 mmol/L (ref 22–32)
CREATININE: 0.98 mg/dL (ref 0.44–1.00)
Calcium: 9.3 mg/dL (ref 8.9–10.3)
GFR, EST NON AFRICAN AMERICAN: 57 mL/min — AB (ref >60)
Glucose, Bld: 183 mg/dL — ABNORMAL HIGH (ref 65–99)
POTASSIUM: 3.6 mmol/L (ref 3.5–5.1)
SODIUM: 140 mmol/L (ref 135–145)
Total Bilirubin: 0.8 mg/dL (ref 0.3–1.2)
Total Protein: 6.6 g/dL (ref 6.5–8.1)

## 2016-12-14 LAB — CBC WITH DIFFERENTIAL/PLATELET
BASOS PCT: 1 %
Basophils Absolute: 0.1 10*3/uL (ref 0–0.1)
EOS ABS: 0.1 10*3/uL (ref 0–0.7)
Eosinophils Relative: 2 %
HEMATOCRIT: 38.6 % (ref 35.0–47.0)
HEMOGLOBIN: 13.6 g/dL (ref 12.0–16.0)
LYMPHS ABS: 1.2 10*3/uL (ref 1.0–3.6)
LYMPHS PCT: 19 %
MCH: 31.5 pg (ref 26.0–34.0)
MCHC: 35.3 g/dL (ref 32.0–36.0)
MCV: 89.2 fL (ref 80.0–100.0)
MONO ABS: 0.4 10*3/uL (ref 0.2–0.9)
MONOS PCT: 6 %
NEUTROS ABS: 4.8 10*3/uL (ref 1.4–6.5)
NEUTROS PCT: 72 %
Platelets: 206 10*3/uL (ref 150–440)
RBC: 4.33 MIL/uL (ref 3.80–5.20)
RDW: 13.1 % (ref 11.5–14.5)
WBC: 6.6 10*3/uL (ref 3.6–11.0)

## 2016-12-14 NOTE — Assessment & Plan Note (Addendum)
Status post resection followed by concurrent chemoradiation-currently on cycle # 8 of adjuvant therapy. JAN 26tht 2018- MRI shows no definite evidence of recurrence in the resection cavity; slight enhancement noted around the cavity; STABLE.   # ? of progression GBM given ongoing headache nausea vomiting.  Continue to hold Temodar today. Get MRI of the brain.  # Nausea/ vomitting- ? Sec to food. Improved currently. Continue to hold Temodar.  # Left frontal headache- question etiology. Get MRI of the brain as soon as possible. If negative would recommend treatment for migraine.   # History of thyroid cancer- on Synthroid. Recent TSH normal. Continue Synthroid.   # follow up in 2-3 weeks; MRI prior; no labs.

## 2016-12-14 NOTE — Progress Notes (Signed)
Temodar has been Held x 1 month- will not start back until the end of may. The dose was held due to headaches. Pt reports improvement of headaches. When the headaches occurred, she would take extra strength Tylenol and the headaches would resolve. She reports 1 episode of nausea and a headache last night and this morning. She states that she went to a party this weekend and ate foods high in sodium. She has not felt well since eating these foods. bp today elevated at 159/90

## 2016-12-14 NOTE — Progress Notes (Signed)
[facility] OFFICE PROGRESS NOTE  Patient Care Team: Carmon Ginsberg, [place] as PCP - General (Family Medicine)  No matching staging information was found for the patient.   Oncology History   1. 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 (January, 2017; s/p resection Dr.Nundkumar; [place]) 2 starting radiation therapy and Temodar January of 2017 3.She has finished radiation therapy in April of 2017 and also finished Temodar for approximately 6 weeks 4.Patient started on maintenance Temodar from Jan 03, 2016; July 2017- MRI no recurrence; left hemisphere- whitish plaques ? 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    #Thyroid cancer s/p Thyroidectomy [s/p RAUI; 1967]     Glioblastoma determined by biopsy of brain ([place])   08/27/2015 Initial Diagnosis    Glioblastoma determined by biopsy of brain ([place])         INTERVAL HISTORY:  Martha [name] 71 y.o.  female pleasant patient above history of GBM right frontal status post resection and currently on adjuvant Temodar- Is here for follow-up.  Patient's Temodar was held for 1 month- because of nausea vomiting.  Patient states that she had episode of nausea with vomiting after- eating cheesecake. Currently resolved.  Patient states she is having intermittent headache in the left side; a son with nausea with vomiting. Improves with Tylenol. Question related to prior history of migraines. Tremors in the upper extremities improved. Otherwise denies any seizures. No new weaknesses. Denies any unusual chest pain or shortness of breath or cough.  She walks with a cane. No falls.   REVIEW OF SYSTEMS:  A complete 10 point review of system is done which is negative except mentioned above/history of present illness.   PAST MEDICAL HISTORY :  Past Medical History:  Diagnosis Date  . Cancer [facility])    thyroid cancer treated with radioactive  iodine  . Depression   . Diabetes mellitus without complication ([place])   . Epilepsy ([place])   . Glioblastoma determined by biopsy of brain ([place])   . Hyperlipidemia   . Hypertension   . Thyroid disease    h/o thyroid ca- 30 years ago  . Varicose vein     PAST SURGICAL HISTORY :   Past Surgical History:  Procedure Laterality Date  . APPLICATION OF CRANIAL NAVIGATION N/A 08/02/2015   Procedure: APPLICATION OF CRANIAL NAVIGATION;  Surgeon: Consuella Lose, MD;  Location: [place] [facility];  Service: Neurosurgery;  Laterality: N/A;  APPLICATION OF CRANIAL NAVIGATION  . BILATERAL CARPAL TUNNEL RELEASE  1990,1991   left hand in 1990 right hand in '91  . CERVICAL SPINE SURGERY  05/1997   spinal fusion  . CHOLECYSTECTOMY  04/24/2008   status post laparoscopic Dr.Ely  . CRANIOTOMY Right 08/02/2015   Procedure: CRANIOTOMY TUMOR EXCISION;  Surgeon: Consuella Lose, MD;  Location: [place] [facility];  Service: Neurosurgery;  Laterality: Right;  CRANIOTOMY TUMOR EXCISION  . radioactive iodine    . THYROIDECTOMY, PARTIAL  1964   due to cancerous tumor  . TOE SURGERY Left 1989    FAMILY HISTORY :   Family History  Problem Relation Age of Onset  . Arthritis Mother   . Hyperlipidemia Mother   . Hypertension Mother   . Heart disease Mother   . Diabetes Mother     type 2  . Thyroid disease Mother   . Cancer Father     colon  . Alcohol abuse Brother   . Hyperlipidemia Brother   .  Hypertension Brother   . Breast cancer Neg Hx   . Ovarian cancer Neg Hx     SOCIAL HISTORY:   Social History  Substance Use Topics  . Smoking status: Former Research scientist (life sciences)  . Smokeless tobacco: Never Used     Comment: quit smoking in 2007  . Alcohol use No    ALLERGIES:  is allergic to ampicillin; fish-derived products; tomato; and penicillins.  MEDICATIONS:  Current Outpatient Prescriptions  Medication Sig Dispense Refill  . acetaminophen (TYLENOL) 500 MG tablet Take 500 mg by mouth every 6 (six) hours as needed  for mild pain or moderate pain.    Marland Kitchen COLCRYS 0.6 MG tablet TAKE ONE PILL EVER 2-3 DAYS FOR GOUT PREVENTION 45 tablet 3  . levETIRAcetam (KEPPRA) 500 MG tablet Take 1 tablet (500 mg total) by mouth 2 (two) times daily. Reported on 12/11/2015    . levothyroxine (SYNTHROID, LEVOTHROID) 100 MCG tablet Take 1 tablet (100 mcg total) by mouth daily. 30 tablet 3  . lisinopril (PRINIVIL,ZESTRIL) 20 MG tablet Take 1 tablet (20 mg total) by mouth daily. 90 tablet 3  . pravastatin (PRAVACHOL) 40 MG tablet Take 1 tablet (40 mg total) by mouth daily. 90 tablet 1  . promethazine (PHENERGAN) 25 MG tablet TAKE 1 TABLET (25 MG TOTAL) BY MOUTH EVERY 6 (SIX) HOURS AS NEEDED FOR NAUSEA OR VOMITING. 30 tablet 0  . nortriptyline (PAMELOR) 10 MG capsule TAKE ONE TO THREE CAPSULES AT BEDTIME TO HELP WITH SLEEP AND NERVE PAIN (Patient not taking: Reported on 12/14/2016) 30 capsule 0  . temozolomide (TEMODAR) 250 MG capsule Take 1 capsule by mouth for 5 days, repeat every 4 weeks. (Patient not taking: Reported on 12/14/2016) 5 capsule 3   No current facility-administered medications for this visit.     PHYSICAL EXAMINATION: ECOG PERFORMANCE STATUS: 2 - Symptomatic, <50% confined to bed  BP (!) 159/90 (BP Location: Left Arm, Patient Position: Sitting)   Pulse 92   Temp 97.2 F (36.2 C) (Tympanic)   Resp 20   Ht 5\' 1"  (1.549 m)   Wt 166 lb (75.3 kg)   BMI 31.37 kg/m   Filed Weights   12/14/16 1526  Weight: 166 lb (75.3 kg)    GENERAL: Well-nourished well-developed; Alert, no distress and comfortable.Walks with a cane. She is accompanied by her friend.  EYES: no pallor or icterus OROPHARYNX: no thrush or ulceration; good dentition  NECK: supple, no masses felt LYMPH:  no palpable lymphadenopathy in the cervical, axillary or inguinal regions LUNGS: clear to auscultation and  No wheeze or crackles HEART/CVS: regular rate & rhythm and no murmurs; No lower extremity edema ABDOMEN:abdomen soft, non-tender and normal  bowel sounds Musculoskeletal:no cyanosis of digits and no clubbing  PSYCH: alert & oriented x 3 with fluent speech NEURO: no focal motor/sensory deficits; No tremors. SKIN:  no rashes or significant lesions  LABORATORY DATA:  I have reviewed the data as listed    Component Value Date/Time   NA 140 12/14/2016 1452   NA 148 (H) 11/14/2015 1623   NA 142 11/14/2014 1506   K 3.6 12/14/2016 1452   K 3.8 11/14/2014 1506   CL 107 12/14/2016 1452   CL 110 11/14/2014 1506   CO2 27 12/14/2016 1452   CO2 23 11/14/2014 1506   GLUCOSE 183 (H) 12/14/2016 1452   GLUCOSE 104 (H) 11/14/2014 1506   BUN 23 (H) 12/14/2016 1452   BUN 13 11/14/2015 1623   BUN 31 (H) 11/14/2014 1506   CREATININE  0.98 12/14/2016 1452   CREATININE 1.08 (H) 11/14/2014 1506   CALCIUM 9.3 12/14/2016 1452   CALCIUM 9.5 11/14/2014 1506   PROT 6.6 12/14/2016 1452   ALBUMIN 3.8 12/14/2016 1452   ALBUMIN 3.9 11/14/2015 1623   AST 22 12/14/2016 1452   ALT 32 12/14/2016 1452   ALKPHOS 97 12/14/2016 1452   BILITOT 0.8 12/14/2016 1452   GFRNONAA 57 (L) 12/14/2016 1452   GFRNONAA 53 (L) 11/14/2014 1506   GFRAA >60 12/14/2016 1452   GFRAA >60 11/14/2014 1506    No results found for: SPEP, UPEP  Lab Results  Component Value Date   WBC 6.6 12/14/2016   NEUTROABS 4.8 12/14/2016   HGB 13.6 12/14/2016   HCT 38.6 12/14/2016   MCV 89.2 12/14/2016   PLT 206 12/14/2016      Chemistry      Component Value Date/Time   NA 140 12/14/2016 1452   NA 148 (H) 11/14/2015 1623   NA 142 11/14/2014 1506   K 3.6 12/14/2016 1452   K 3.8 11/14/2014 1506   CL 107 12/14/2016 1452   CL 110 11/14/2014 1506   CO2 27 12/14/2016 1452   CO2 23 11/14/2014 1506   BUN 23 (H) 12/14/2016 1452   BUN 13 11/14/2015 1623   BUN 31 (H) 11/14/2014 1506   CREATININE 0.98 12/14/2016 1452   CREATININE 1.08 (H) 11/14/2014 1506      Component Value Date/Time   CALCIUM 9.3 12/14/2016 1452   CALCIUM 9.5 11/14/2014 1506   ALKPHOS 97 12/14/2016 1452    AST 22 12/14/2016 1452   ALT 32 12/14/2016 1452   BILITOT 0.8 12/14/2016 1452     IMPRESSION: 1. Unchanged appearance of right temporal lobe resection site and mild surrounding white matter T2 signal abnormality. 2. Unchanged scattered foci of enhancement and mineralization predominantly in the left cerebral hemisphere, indeterminate but may reflect treated thyroid metastases or prior infection.   Electronically Signed   By: Logan Bores M.D.   On: 09/04/2016 12:45  RADIOGRAPHIC STUDIES: I have personally reviewed the radiological images as listed and agreed with the findings in the report. No results found.   ASSESSMENT & PLAN:  Glioblastoma determined by biopsy of brain [facility]) Status post resection followed by concurrent chemoradiation-currently on cycle # 8 of adjuvant therapy. JAN 26tht 2018- MRI shows no definite evidence of recurrence in the resection cavity; slight enhancement noted around the cavity; STABLE.   # ? of progression GBM given ongoing headache nausea vomiting.  Continue to hold Temodar today. Get MRI of the brain.  # Nausea/ vomitting- ? Sec to food. Improved currently. Continue to hold Temodar.  # Left frontal headache- question etiology. Get MRI of the brain as soon as possible. If negative would recommend treatment for migraine.   # History of thyroid cancer- on Synthroid. Recent TSH normal. Continue Synthroid.   # follow up in 2-3 weeks; MRI prior; no labs.    Orders Placed This Encounter  Procedures  . MR BRAIN W WO CONTRAST    Standing Status:   Future    Standing Expiration Date:   02/13/2018    Order Specific Question:   Reason for Exam (SYMPTOM  OR DIAGNOSIS REQUIRED)    Answer:   GBM    Order Specific Question:   Preferred imaging location?    Answer:   [facility] (table limit-300lbs)    Order Specific Question:   Does the patient have a pacemaker or implanted devices?    Answer:  No    Order Specific Question:   What is the  patient's sedation requirement?    Answer:   No Sedation      Cammie Sickle, MD 12/14/2016 4:17 PM

## 2016-12-25 ENCOUNTER — Ambulatory Visit
Admission: RE | Admit: 2016-12-25 | Discharge: 2016-12-25 | Disposition: A | Discharge status: Home / Self Care | Payer: Medicare Other | Source: Ambulatory Visit | Attending: Internal Medicine | Admitting: Internal Medicine

## 2016-12-25 DIAGNOSIS — C719 Malignant neoplasm of brain, unspecified: Secondary | ICD-10-CM | POA: Diagnosis not present

## 2016-12-25 DIAGNOSIS — Z85850 Personal history of malignant neoplasm of thyroid: Secondary | ICD-10-CM | POA: Insufficient documentation

## 2016-12-25 MED ORDER — GADOBENATE DIMEGLUMINE 529 MG/ML IV SOLN
15.0000 mL | Freq: Once | INTRAVENOUS | Status: AC | PRN
  Administered 2016-12-25: 15 mL via INTRAVENOUS

## 2017-01-07 ENCOUNTER — Telehealth: Payer: Self-pay | Admitting: *Deleted

## 2017-01-07 ENCOUNTER — Ambulatory Visit (INDEPENDENT_AMBULATORY_CARE_PROVIDER_SITE_OTHER): Payer: Medicare Other | Admitting: Family Medicine

## 2017-01-07 ENCOUNTER — Encounter: Payer: Self-pay | Admitting: Family Medicine

## 2017-01-07 ENCOUNTER — Inpatient Hospital Stay (HOSPITAL_BASED_OUTPATIENT_CLINIC_OR_DEPARTMENT_OTHER): Payer: Medicare Other | Admitting: Internal Medicine

## 2017-01-07 ENCOUNTER — Other Ambulatory Visit: Payer: Self-pay | Admitting: *Deleted

## 2017-01-07 VITALS — BP 118/85 | HR 97 | Temp 97.9°F | Resp 16 | Ht 61.0 in | Wt 167.5 lb

## 2017-01-07 VITALS — BP 122/88 | HR 92 | Temp 98.6°F | Resp 18 | Wt 169.0 lb

## 2017-01-07 DIAGNOSIS — C712 Malignant neoplasm of temporal lobe: Secondary | ICD-10-CM

## 2017-01-07 DIAGNOSIS — M791 Myalgia, unspecified site: Secondary | ICD-10-CM

## 2017-01-07 DIAGNOSIS — Z85850 Personal history of malignant neoplasm of thyroid: Secondary | ICD-10-CM

## 2017-01-07 DIAGNOSIS — R112 Nausea with vomiting, unspecified: Secondary | ICD-10-CM | POA: Diagnosis not present

## 2017-01-07 DIAGNOSIS — I10 Essential (primary) hypertension: Secondary | ICD-10-CM

## 2017-01-07 DIAGNOSIS — Z79899 Other long term (current) drug therapy: Secondary | ICD-10-CM

## 2017-01-07 DIAGNOSIS — Z923 Personal history of irradiation: Secondary | ICD-10-CM | POA: Diagnosis not present

## 2017-01-07 DIAGNOSIS — N183 Chronic kidney disease, stage 3 (moderate): Secondary | ICD-10-CM

## 2017-01-07 DIAGNOSIS — E1122 Type 2 diabetes mellitus with diabetic chronic kidney disease: Secondary | ICD-10-CM

## 2017-01-07 DIAGNOSIS — R51 Headache: Secondary | ICD-10-CM

## 2017-01-07 DIAGNOSIS — R140 Abdominal distension (gaseous): Secondary | ICD-10-CM | POA: Diagnosis not present

## 2017-01-07 DIAGNOSIS — C719 Malignant neoplasm of brain, unspecified: Secondary | ICD-10-CM

## 2017-01-07 LAB — POCT GLYCOSYLATED HEMOGLOBIN (HGB A1C): HEMOGLOBIN A1C: 8.2

## 2017-01-07 MED ORDER — HYDROCODONE-ACETAMINOPHEN 5-325 MG PO TABS: ORAL_TABLET | ORAL | 0 refills | Status: DC

## 2017-01-07 NOTE — Assessment & Plan Note (Addendum)
Status post resection followed by concurrent chemoradiation-currently on cycle # 8 of adjuvant therapy. Temodar is on hold for the last 2 months because of possible side effects/poor tolerance [C discussion below]  MAY 24th MRI shows no definite evidence of recurrence in the resection cavity; slight enhancement noted around the cavity-likely postoperative; STABLE. Given the favorable results of the MRI/poor tolerance of Temodar- I think is reasonable to hold Temodar at this time. Monitor the patient closely for recurrence.  # Nausea/ vomitting- ? Secondary to Temodar Improved currently. Continue to hold Temodar.  # Abdominal distention- question etiology. No obvious concerns for fluid accumulation noted. If continues to get worse patient will need repeat imaging. She agrees to let us know if worse.  # Muscle weakness- question related to statin. Recommend holding Pravachol. We'll check CK at next visit.  # History of thyroid cancer- on Synthroid. Recent TSH normal. Continue Synthroid.   # Follow-up in approximately 2 months/labs. We'll order MRI at that visit.

## 2017-01-07 NOTE — Progress Notes (Signed)
Subjective:     Patient ID: Martha [name], female   DOB: Aug 26, 1945, 71 y.o.   MRN: [id number]  HPI  Chief Complaint  Patient presents with  . Diabetes  . Hypertension  . Back Pain    Pt reports that she is having back and leg pain. No know injury. She reports that she tried to walk with no cane ans the next day her back was hurting. She says tylenol has been helping for the pain.   Reports that she has not been taking glipizide. Reports her appetite is poor and she often does not eat much at a specific meal. Concerned about abdominal bloating (wt. Increased 4# over last 3 months) and feels fat. Exercise is 10 minutes on an exercise bike daily and walking to her mailbox. Discussed that both Keppra and chemo (completed last cycle 2 months ago per her report) could contribute to both diminished appetite and weight gain. Reports significant myalgias in her thighs which are debilitating but improve quickly with use of analgesics. Has had recent normal metabolic profile and CBC per oncology. Accompanied by her friend, [name].   Review of Systems  Respiratory: Negative for shortness of breath.   Cardiovascular: Negative for chest pain.       rObjective:   Physical Exam  Constitutional: She appears well-developed and well-nourished. No distress.  Cardiovascular: Normal rate and regular rhythm.   Pulmonary/Chest: Breath sounds normal.  Abdominal: Soft. There is tenderness (mild across upper quadrants and left LQ). There is no guarding.  Musculoskeletal: She exhibits no edema (of lower extemities).  SLR's to 90 degrees without discomfort. Hip IR and ER without discomfort. No right trochanteric tenderness.       Assessment:    1. Type 2 diabetes mellitus with stage 3 chronic kidney disease, without long-term current use of insulin ([place]): resume glipizide before largest meal of the day - POCT HgB A1C  2. Abdominal bloating - US Abdomen Complete; Future  3. Myalgia -  HYDROcodone-acetaminophen (NORCO/VICODIN) 5-325 MG tablet; One every 4-6 hours as needed for pain  Dispense: 20 tablet; Refill: 0  4. Essential hypertension: stable    Plan:    Further f/u pending ultrasound result.

## 2017-01-07 NOTE — Progress Notes (Signed)
Patient here today for follow up.  Patient c/o hip pain and daily abdominal bloating

## 2017-01-07 NOTE — Progress Notes (Signed)
[facility] OFFICE PROGRESS NOTE  Patient Care Team: Carmon Ginsberg, [place] as PCP - General (Family Medicine)  No matching staging information was found for the patient.   Oncology History   1. 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 (January, 2017; s/p resection Dr.Nundkumar; [place]) 2 starting radiation therapy and Temodar January of 2017 3.She has finished radiation therapy in April of 2017 and also finished Temodar for approximately 6 weeks 4.Patient started on maintenance Temodar from Jan 03, 2016; July 2017- MRI no recurrence; left hemisphere- whitish plaques ? 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    #Thyroid cancer s/p Thyroidectomy [s/p RAUI; 1967]     Glioblastoma determined by biopsy of brain ([place])   08/27/2015 Initial Diagnosis    Glioblastoma determined by biopsy of brain ([place])         INTERVAL HISTORY:  Martha [name] 71 y.o.  female pleasant patient above history of GBM right frontal status post resection and currently on adjuvant Temodar- Is here for follow-up/ to review the results of the MRI of the brain. Patient's Temodar is on hold for the last 2 months because of nausea vomiting.  Since holding the Temodar nausea vomiting improved. However she complains of weakness in the muscles especially the legs. She has some difficulty walking.  She continues to Tylenol as needed for intermittent headaches. This is not getting any worse. Overall improved. Otherwise denies any seizures. Denies any unusual chest pain or shortness of breath or cough.  She walks with a cane. No falls.   REVIEW OF SYSTEMS:  A complete 10 point review of system is done which is negative except mentioned above/history of present illness.   PAST MEDICAL HISTORY :  Past Medical History:  Diagnosis Date  . Cancer [facility])    thyroid cancer treated with radioactive iodine  . Depression    . Diabetes mellitus without complication ([place])   . Epilepsy ([place])   . Glioblastoma determined by biopsy of brain ([place])   . Hyperlipidemia   . Hypertension   . Thyroid disease    h/o thyroid ca- 30 years ago  . Varicose vein     PAST SURGICAL HISTORY :   Past Surgical History:  Procedure Laterality Date  . APPLICATION OF CRANIAL NAVIGATION N/A 08/02/2015   Procedure: APPLICATION OF CRANIAL NAVIGATION;  Surgeon: Consuella Lose, MD;  Location: [place] NEURO ORS;  Service: Neurosurgery;  Laterality: N/A;  APPLICATION OF CRANIAL NAVIGATION  . BILATERAL CARPAL TUNNEL RELEASE  1990,1991   left hand in 1990 right hand in '91  . CERVICAL SPINE SURGERY  05/1997   spinal fusion  . CHOLECYSTECTOMY  04/24/2008   status post laparoscopic Dr.Ely  . CRANIOTOMY Right 08/02/2015   Procedure: CRANIOTOMY TUMOR EXCISION;  Surgeon: Consuella Lose, MD;  Location: [place] NEURO ORS;  Service: Neurosurgery;  Laterality: Right;  CRANIOTOMY TUMOR EXCISION  . radioactive iodine    . THYROIDECTOMY, PARTIAL  1964   due to cancerous tumor  . TOE SURGERY Left 1989    FAMILY HISTORY :   Family History  Problem Relation Age of Onset  . Arthritis Mother   . Hyperlipidemia Mother   . Hypertension Mother   . Heart disease Mother   . Diabetes Mother        type 2  . Thyroid disease Mother   . Cancer Father        colon  . Alcohol abuse  Brother   . Hyperlipidemia Brother   . Hypertension Brother   . Breast cancer Neg Hx   . Ovarian cancer Neg Hx     SOCIAL HISTORY:   Social History  Substance Use Topics  . Smoking status: Former Research scientist (life sciences)  . Smokeless tobacco: Never Used     Comment: quit smoking in 2007  . Alcohol use No    ALLERGIES:  is allergic to ampicillin; fish-derived products; tomato; and penicillins.  MEDICATIONS:  Current Outpatient Prescriptions  Medication Sig Dispense Refill  . acetaminophen (TYLENOL) 500 MG tablet Take 500 mg by mouth every 6 (six) hours as needed for mild pain or  moderate pain.    Marland Kitchen COLCRYS 0.6 MG tablet TAKE ONE PILL EVER 2-3 DAYS FOR GOUT PREVENTION 45 tablet 3  . glipiZIDE (GLUCOTROL) 5 MG tablet Take 5 mg by mouth daily.    Marland Kitchen HYDROcodone-acetaminophen (NORCO/VICODIN) 5-325 MG tablet One every 4-6 hours as needed for pain 20 tablet 0  . levETIRAcetam (KEPPRA) 500 MG tablet Take 1 tablet (500 mg total) by mouth 2 (two) times daily. Reported on 12/11/2015    . levothyroxine (SYNTHROID, LEVOTHROID) 100 MCG tablet Take 1 tablet (100 mcg total) by mouth daily. 30 tablet 3  . lisinopril (PRINIVIL,ZESTRIL) 20 MG tablet Take 1 tablet (20 mg total) by mouth daily. 90 tablet 3  . nortriptyline (PAMELOR) 10 MG capsule TAKE ONE TO THREE CAPSULES AT BEDTIME TO HELP WITH SLEEP AND NERVE PAIN 30 capsule 0  . pravastatin (PRAVACHOL) 40 MG tablet Take 1 tablet (40 mg total) by mouth daily. 90 tablet 1  . promethazine (PHENERGAN) 25 MG tablet TAKE 1 TABLET (25 MG TOTAL) BY MOUTH EVERY 6 (SIX) HOURS AS NEEDED FOR NAUSEA OR VOMITING. 30 tablet 0  . temozolomide (TEMODAR) 250 MG capsule Take 1 capsule by mouth for 5 days, repeat every 4 weeks. (Patient not taking: Reported on 12/14/2016) 5 capsule 3   No current facility-administered medications for this visit.     PHYSICAL EXAMINATION: ECOG PERFORMANCE STATUS: 2 - Symptomatic, <50% confined to bed  BP 118/85 (BP Location: Left Arm, Patient Position: Sitting)   Pulse 97   Temp 97.9 F (36.6 C) (Tympanic)   Resp 16   Ht 5\' 1"  (1.549 m)   Wt 167 lb 8 oz (76 kg)   BMI 31.65 kg/m   Filed Weights   01/07/17 1518  Weight: 167 lb 8 oz (76 kg)    GENERAL: Well-nourished well-developed; Alert, no distress and comfortable.Walks with a cane. She is accompanied by her friend.  EYES: no pallor or icterus OROPHARYNX: no thrush or ulceration; good dentition  NECK: supple, no masses felt LYMPH:  no palpable lymphadenopathy in the cervical, axillary or inguinal regions LUNGS: clear to auscultation and  No wheeze or  crackles HEART/CVS: regular rate & rhythm and no murmurs; No lower extremity edema ABDOMEN:abdomen soft, non-tender and normal bowel sounds Musculoskeletal:no cyanosis of digits and no clubbing  PSYCH: alert & oriented x 3 with fluent speech NEURO: no focal motor/sensory deficits; No tremors. SKIN:  no rashes or significant lesions  LABORATORY DATA:  I have reviewed the data as listed    Component Value Date/Time   NA 140 12/14/2016 1452   NA 148 (H) 11/14/2015 1623   NA 142 11/14/2014 1506   K 3.6 12/14/2016 1452   K 3.8 11/14/2014 1506   CL 107 12/14/2016 1452   CL 110 11/14/2014 1506   CO2 27 12/14/2016 1452   CO2 23  11/14/2014 1506   GLUCOSE 183 (H) 12/14/2016 1452   GLUCOSE 104 (H) 11/14/2014 1506   BUN 23 (H) 12/14/2016 1452   BUN 13 11/14/2015 1623   BUN 31 (H) 11/14/2014 1506   CREATININE 0.98 12/14/2016 1452   CREATININE 1.08 (H) 11/14/2014 1506   CALCIUM 9.3 12/14/2016 1452   CALCIUM 9.5 11/14/2014 1506   PROT 6.6 12/14/2016 1452   ALBUMIN 3.8 12/14/2016 1452   ALBUMIN 3.9 11/14/2015 1623   AST 22 12/14/2016 1452   ALT 32 12/14/2016 1452   ALKPHOS 97 12/14/2016 1452   BILITOT 0.8 12/14/2016 1452   GFRNONAA 57 (L) 12/14/2016 1452   GFRNONAA 53 (L) 11/14/2014 1506   GFRAA >60 12/14/2016 1452   GFRAA >60 11/14/2014 1506    No results found for: SPEP, UPEP  Lab Results  Component Value Date   WBC 6.6 12/14/2016   NEUTROABS 4.8 12/14/2016   HGB 13.6 12/14/2016   HCT 38.6 12/14/2016   MCV 89.2 12/14/2016   PLT 206 12/14/2016      Chemistry      Component Value Date/Time   NA 140 12/14/2016 1452   NA 148 (H) 11/14/2015 1623   NA 142 11/14/2014 1506   K 3.6 12/14/2016 1452   K 3.8 11/14/2014 1506   CL 107 12/14/2016 1452   CL 110 11/14/2014 1506   CO2 27 12/14/2016 1452   CO2 23 11/14/2014 1506   BUN 23 (H) 12/14/2016 1452   BUN 13 11/14/2015 1623   BUN 31 (H) 11/14/2014 1506   CREATININE 0.98 12/14/2016 1452   CREATININE 1.08 (H) 11/14/2014  1506      Component Value Date/Time   CALCIUM 9.3 12/14/2016 1452   CALCIUM 9.5 11/14/2014 1506   ALKPHOS 97 12/14/2016 1452   AST 22 12/14/2016 1452   ALT 32 12/14/2016 1452   BILITOT 0.8 12/14/2016 1452     IMPRESSION: 1. Unchanged appearance of right temporal lobe resection site and mild surrounding white matter T2 signal abnormality. 2. Unchanged scattered foci of enhancement and mineralization predominantly in the left cerebral hemisphere, indeterminate but may reflect treated thyroid metastases or prior infection.   Electronically Signed   By: Logan Bores M.D.   On: 09/04/2016 12:45  RADIOGRAPHIC STUDIES: I have personally reviewed the radiological images as listed and agreed with the findings in the report. No results found.   ASSESSMENT & PLAN:  Glioblastoma determined by biopsy of brain [facility]) Status post resection followed by concurrent chemoradiation-currently on cycle # 8 of adjuvant therapy. Temodar is on hold for the last 2 months because of possible side effects/poor tolerance [C discussion below]  MAY 24th MRI shows no definite evidence of recurrence in the resection cavity; slight enhancement noted around the cavity-likely postoperative; STABLE. Given the favorable results of the MRI/poor tolerance of Temodar- I think is reasonable to hold Temodar at this time. Monitor the patient closely for recurrence.  # Nausea/ vomitting- ? Secondary to Temodar Improved currently. Continue to hold Temodar.  # Abdominal distention- question etiology. No obvious concerns for fluid accumulation noted. If continues to get worse patient will need repeat imaging. She agrees to let us know if worse.  # Muscle weakness- question related to statin. Recommend holding Pravachol. We'll check CK at next visit.  # History of thyroid cancer- on Synthroid. Recent TSH normal. Continue Synthroid.   # Follow-up in approximately 2 months/labs. We'll order MRI at that visit.   Orders  Placed This Encounter  Procedures  .  CBC with Differential/Platelet    Standing Status:   Future    Standing Expiration Date:   01/07/2018  . Comprehensive metabolic panel    Standing Status:   Future    Standing Expiration Date:   01/07/2018  . CK    Standing Status:   Future    Standing Expiration Date:   01/07/2018      Cammie Sickle, MD 01/07/2017 4:59 PM

## 2017-01-07 NOTE — Patient Instructions (Signed)
We will call you about the scheduling of the abdominal ultrasound.Resume glipizide for your diabetes 30 minutes before your largest meal of the day. Tylenol or hydrocodone ok for your muscle pains. Suspect side effect from chemo. Poor appetite may also be from medication (keppra).

## 2017-01-07 NOTE — Telephone Encounter (Signed)
Received msg from [facility] stating that patient needs new rx for Temodar. States that pt explained that the amount of drug dosing had changed. Need new rx to reflect this.  Pt evaluated in clinic today. Pt educated by Dr. Rogue Bussing to hold Temodar until further instructed to start back.    Almyra Free, CMA called and spoke with [facility]ics today-to inform [facility] that Temodar is currently on hold for this patient.

## 2017-01-07 NOTE — Patient Instructions (Signed)
STOP pravastatin.

## 2017-01-14 ENCOUNTER — Ambulatory Visit: Payer: Medicare Other

## 2017-01-22 ENCOUNTER — Ambulatory Visit
Admission: RE | Admit: 2017-01-22 | Discharge: 2017-01-22 | Disposition: A | Discharge status: Home / Self Care | Payer: Medicare Other | Source: Ambulatory Visit | Attending: Family Medicine | Admitting: Family Medicine

## 2017-01-22 ENCOUNTER — Other Ambulatory Visit: Payer: Self-pay | Admitting: Family Medicine

## 2017-01-22 DIAGNOSIS — N281 Cyst of kidney, acquired: Secondary | ICD-10-CM | POA: Insufficient documentation

## 2017-01-22 DIAGNOSIS — Z9049 Acquired absence of other specified parts of digestive tract: Secondary | ICD-10-CM | POA: Insufficient documentation

## 2017-01-22 DIAGNOSIS — R140 Abdominal distension (gaseous): Secondary | ICD-10-CM | POA: Diagnosis not present

## 2017-01-22 DIAGNOSIS — I714 Abdominal aortic aneurysm, without rupture, unspecified: Secondary | ICD-10-CM

## 2017-01-22 DIAGNOSIS — K7689 Other specified diseases of liver: Secondary | ICD-10-CM | POA: Insufficient documentation

## 2017-01-27 ENCOUNTER — Other Ambulatory Visit: Payer: Self-pay | Admitting: Family Medicine

## 2017-01-27 DIAGNOSIS — I714 Abdominal aortic aneurysm, without rupture, unspecified: Secondary | ICD-10-CM

## 2017-02-03 ENCOUNTER — Ambulatory Visit
Admission: RE | Admit: 2017-02-03 | Discharge: 2017-02-03 | Disposition: A | Discharge status: Home / Self Care | Payer: Medicare Other | Source: Ambulatory Visit | Attending: Family Medicine | Admitting: Family Medicine

## 2017-02-03 DIAGNOSIS — I714 Abdominal aortic aneurysm, without rupture, unspecified: Secondary | ICD-10-CM

## 2017-02-03 DIAGNOSIS — I77810 Thoracic aortic ectasia: Secondary | ICD-10-CM | POA: Diagnosis not present

## 2017-02-13 ENCOUNTER — Other Ambulatory Visit: Payer: Self-pay | Admitting: Internal Medicine

## 2017-02-13 ENCOUNTER — Other Ambulatory Visit: Payer: Self-pay | Admitting: Family Medicine

## 2017-02-13 DIAGNOSIS — N183 Chronic kidney disease, stage 3 (moderate): Principal | ICD-10-CM

## 2017-02-13 DIAGNOSIS — E890 Postprocedural hypothyroidism: Secondary | ICD-10-CM

## 2017-02-13 DIAGNOSIS — E1122 Type 2 diabetes mellitus with diabetic chronic kidney disease: Secondary | ICD-10-CM

## 2017-02-15 ENCOUNTER — Encounter: Payer: Self-pay | Admitting: Family Medicine

## 2017-02-15 ENCOUNTER — Ambulatory Visit (INDEPENDENT_AMBULATORY_CARE_PROVIDER_SITE_OTHER): Payer: Medicare Other | Admitting: Family Medicine

## 2017-02-15 VITALS — BP 152/100 | HR 84 | Temp 98.0°F | Resp 16 | Wt 173.0 lb

## 2017-02-15 DIAGNOSIS — M10072 Idiopathic gout, left ankle and foot: Secondary | ICD-10-CM

## 2017-02-15 DIAGNOSIS — G43009 Migraine without aura, not intractable, without status migrainosus: Secondary | ICD-10-CM

## 2017-02-15 MED ORDER — PREDNISONE 10 MG PO TABS: ORAL_TABLET | ORAL | 0 refills | Status: DC

## 2017-02-15 NOTE — Patient Instructions (Signed)
Use phenergan (promethazine) at home for nausea. Stop the ibuprofen while on prednisone.

## 2017-02-15 NOTE — Progress Notes (Signed)
Subjective:     Patient ID: Martha [name], female   DOB: 1946-04-04, 71 y.o.   MRN: [id number]  HPI  Chief Complaint  Patient presents with  . Foot Pain    Started Two weeks ago. Then started back about two weeks ago.   Marland Kitchen Headache    Started a few hours ago; nausea  States her left foot and ankle have been exquisitely tender and swollen over the last 2-3 days. States she was unable to walk on it: "I bumped myself down the stairs." She has been taking ibuprofen and today has taken up to eight tablets with improvement in her foot pain. She is wearing a walking splint today. Unfortunately she has also developed a migraine-like headache today. Reports she has some promethazine at home but has not taken any.   Review of Systems     Objective:   Physical Exam  Constitutional: She appears well-developed and well-nourished. No distress.  Eyes: EOM are normal. Pupils are equal, round, and reactive to light.  Cardiovascular:  Pulses:      Dorsalis pedis pulses are 2+ on the left side.       Posterior tibial pulses are 2+ on the left side.  Musculoskeletal:  Left foot and ankle without erythema or swelling. Localizes area of tenderness to lateral malleolus. Ankle ligaments stable. No plantar tenderness.       Assessment:    1. Acute idiopathic gout of left ankle - predniSONE (DELTASONE) 10 MG tablet; Taper daily as follows: 6 pills, 5, 4, 3, 2, 1  Dispense: 21 tablet; Refill: 0  2. Migraine without aura and without status migrainosus, not intractable    Plan:    Stop ibuprofen while on prednisone and to minimize rebound headache. Suggested she use promethazine at home to help with nausea/sleep.

## 2017-02-25 ENCOUNTER — Emergency Department
Admission: EM | Admit: 2017-02-25 | Discharge: 2017-02-26 | Disposition: A | Discharge status: Home / Self Care | Payer: Medicare Other | Attending: Emergency Medicine | Admitting: Emergency Medicine

## 2017-02-25 ENCOUNTER — Other Ambulatory Visit: Payer: Self-pay | Admitting: Family Medicine

## 2017-02-25 ENCOUNTER — Emergency Department: Payer: Medicare Other

## 2017-02-25 DIAGNOSIS — E1122 Type 2 diabetes mellitus with diabetic chronic kidney disease: Secondary | ICD-10-CM | POA: Insufficient documentation

## 2017-02-25 DIAGNOSIS — M25571 Pain in right ankle and joints of right foot: Secondary | ICD-10-CM | POA: Diagnosis not present

## 2017-02-25 DIAGNOSIS — R51 Headache: Secondary | ICD-10-CM | POA: Diagnosis not present

## 2017-02-25 DIAGNOSIS — Z7984 Long term (current) use of oral hypoglycemic drugs: Secondary | ICD-10-CM | POA: Diagnosis not present

## 2017-02-25 DIAGNOSIS — E039 Hypothyroidism, unspecified: Secondary | ICD-10-CM | POA: Diagnosis not present

## 2017-02-25 DIAGNOSIS — G43909 Migraine, unspecified, not intractable, without status migrainosus: Secondary | ICD-10-CM | POA: Diagnosis not present

## 2017-02-25 DIAGNOSIS — M109 Gout, unspecified: Secondary | ICD-10-CM

## 2017-02-25 DIAGNOSIS — Z79899 Other long term (current) drug therapy: Secondary | ICD-10-CM | POA: Diagnosis not present

## 2017-02-25 DIAGNOSIS — N183 Chronic kidney disease, stage 3 (moderate): Secondary | ICD-10-CM | POA: Insufficient documentation

## 2017-02-25 DIAGNOSIS — Z85850 Personal history of malignant neoplasm of thyroid: Secondary | ICD-10-CM | POA: Insufficient documentation

## 2017-02-25 DIAGNOSIS — I129 Hypertensive chronic kidney disease with stage 1 through stage 4 chronic kidney disease, or unspecified chronic kidney disease: Secondary | ICD-10-CM | POA: Insufficient documentation

## 2017-02-25 DIAGNOSIS — M10072 Idiopathic gout, left ankle and foot: Secondary | ICD-10-CM

## 2017-02-25 DIAGNOSIS — Z87891 Personal history of nicotine dependence: Secondary | ICD-10-CM | POA: Insufficient documentation

## 2017-02-25 DIAGNOSIS — R519 Headache, unspecified: Secondary | ICD-10-CM

## 2017-02-25 DIAGNOSIS — M7989 Other specified soft tissue disorders: Secondary | ICD-10-CM | POA: Diagnosis not present

## 2017-02-25 LAB — COMPREHENSIVE METABOLIC PANEL
ALBUMIN: 4.2 g/dL (ref 3.5–5.0)
ALK PHOS: 112 U/L (ref 38–126)
ALT: 30 U/L (ref 14–54)
ANION GAP: 12 (ref 5–15)
AST: 20 U/L (ref 15–41)
BILIRUBIN TOTAL: 1.4 mg/dL — AB (ref 0.3–1.2)
BUN: 25 mg/dL — ABNORMAL HIGH (ref 6–20)
CALCIUM: 9.8 mg/dL (ref 8.9–10.3)
CO2: 23 mmol/L (ref 22–32)
CREATININE: 1.32 mg/dL — AB (ref 0.44–1.00)
Chloride: 104 mmol/L (ref 101–111)
GFR calc non Af Amer: 40 mL/min — ABNORMAL LOW (ref >60)
GFR, EST AFRICAN AMERICAN: 46 mL/min — AB (ref >60)
GLUCOSE: 223 mg/dL — AB (ref 65–99)
Potassium: 3.5 mmol/L (ref 3.5–5.1)
SODIUM: 139 mmol/L (ref 135–145)
TOTAL PROTEIN: 7.5 g/dL (ref 6.5–8.1)

## 2017-02-25 LAB — CBC
HCT: 41.1 % (ref 35.0–47.0)
HEMOGLOBIN: 14.3 g/dL (ref 12.0–16.0)
MCH: 31.4 pg (ref 26.0–34.0)
MCHC: 34.8 g/dL (ref 32.0–36.0)
MCV: 90.2 fL (ref 80.0–100.0)
Platelets: 273 10*3/uL (ref 150–440)
RBC: 4.56 MIL/uL (ref 3.80–5.20)
RDW: 13 % (ref 11.5–14.5)
WBC: 15.9 10*3/uL — AB (ref 3.6–11.0)

## 2017-02-25 LAB — DIFFERENTIAL
Basophils Absolute: 0.1 10*3/uL (ref 0–0.1)
Basophils Relative: 1 %
Eosinophils Absolute: 0 10*3/uL (ref 0–0.7)
Eosinophils Relative: 0 %
LYMPHS ABS: 1.7 10*3/uL (ref 1.0–3.6)
LYMPHS PCT: 11 %
MONO ABS: 1.3 10*3/uL — AB (ref 0.2–0.9)
Monocytes Relative: 9 %
NEUTROS ABS: 12.7 10*3/uL — AB (ref 1.4–6.5)
Neutrophils Relative %: 79 %

## 2017-02-25 LAB — TROPONIN I: Troponin I: <0.03 ng/mL (ref <0.03)

## 2017-02-25 MED ORDER — COLCHICINE 0.6 MG PO TABS: 0.6000 mg | ORAL_TABLET | Freq: Every day | ORAL | 3 refills | Status: DC

## 2017-02-25 MED ORDER — PREDNISONE 10 MG PO TABS: ORAL_TABLET | ORAL | 0 refills | Status: DC

## 2017-02-25 NOTE — ED Triage Notes (Signed)
Pt presents to ED via POV in w/c with c/o migraine headache x2 weeks, and Gout flare-up pain in bilateral knee, knees, and feet. Pt reports h/x of migraines, current headache is similar. Of note, pt is currently undergoing Chemo t/x for brain cancer (last chemo t/s was 3 months ago). Pt reports (+) nausea, but denies V/D; no fever. Denies CP or SHOB. Hand grips strong and equal; MAEW; no slurred speech or facial droop.

## 2017-02-25 NOTE — Telephone Encounter (Signed)
Pt contacted office for refill request on the following medications: 1. COLCRYS 0.6 MG tablet  2. predniSONE (DELTASONE) 10 MG tablet  [facility] [address]. Pt stated that the Prednisone from 02/15/17 helped with the gout in her foot now she is in a lot of pain in the other foot and is asking for both medications. Pt stated she is in a lot of pain to the point she can hardly move. Please advise. Thanks TNP

## 2017-02-26 ENCOUNTER — Emergency Department: Payer: Medicare Other

## 2017-02-26 DIAGNOSIS — M25571 Pain in right ankle and joints of right foot: Secondary | ICD-10-CM | POA: Diagnosis not present

## 2017-02-26 DIAGNOSIS — M7989 Other specified soft tissue disorders: Secondary | ICD-10-CM | POA: Diagnosis not present

## 2017-02-26 LAB — URIC ACID: URIC ACID, SERUM: 8.6 mg/dL — AB (ref 2.3–6.6)

## 2017-02-26 MED ORDER — KETOROLAC TROMETHAMINE 30 MG/ML IJ SOLN
30.0000 mg | Freq: Once | INTRAMUSCULAR | Status: AC
  Administered 2017-02-26: 30 mg via INTRAVENOUS
  Filled 2017-02-26: qty 1

## 2017-02-26 MED ORDER — PREDNISONE 20 MG PO TABS: 40.0000 mg | ORAL_TABLET | Freq: Every day | ORAL | 0 refills | Status: DC

## 2017-02-26 NOTE — Discharge Instructions (Signed)
Please take your steroids as prescribed. Please follow-up with your doctor in the next several days for recheck/reevaluation. Return to the emergency department for any worsening pain, any fever, or any other symptom personally concerning to yourself.

## 2017-02-26 NOTE — ED Provider Notes (Signed)
[facility] Emergency Department Provider Note  Time seen: 1:26 AM  I have reviewed the triage vital signs and the nursing notes.   HISTORY  Chief Complaint Migraine and Gout    HPI Martha [name] is a 71 y.o. female with a past medical history of glioblastoma status post resection January 2017, diabetes, epilepsy, hypertension, hyperlipidemia, presents to the emergency department for headache and right ankle pain. According to the patient for the past 2-3 weeks she has been getting daily headaches. States she has been taking Excedrin with some pain relief, headaches appear to go away but then come back Day. States a mild headache at this time. She also states over the past one week she has developed increasing pain in her right ankle, states a history of gout which this feels similar however also states one week ago he fell and might hav injured the right ankle. She has moderate swelling of the right ankle and foot and it is painful to walk on.  Past Medical History:  Diagnosis Date  . Cancer [facility])    thyroid cancer treated with radioactive iodine  . Depression   . Diabetes mellitus without complication ([place])   . Epilepsy ([place])   . Glioblastoma determined by biopsy of brain ([place])   . Hyperlipidemia   . Hypertension   . Thyroid disease    h/o thyroid ca- 30 years ago  . Varicose vein     Patient Active Problem List   Diagnosis Date Noted  . Right hip pain 10/14/2016  . Thyroid cancer ([place]) 10/14/2016  . CKD (chronic kidney disease) stage 3, GFR 30-59 ml/min 07/06/2016  . Gout 04/06/2016  . Glioblastoma determined by biopsy of brain ([place]) 08/27/2015  . S/P craniotomy 08/02/2015  . HLD (hyperlipidemia) 12/24/2014  . Adult hypothyroidism 12/24/2014  . Headache, migraine 12/24/2014  . Essential hypertension 05/29/2014  . Type 2 diabetes mellitus ([place]) 05/29/2014  . Seizure disorder ([place]) 05/29/2014  . Adiposity 05/29/2014    Past Surgical  History:  Procedure Laterality Date  . APPLICATION OF CRANIAL NAVIGATION N/A 08/02/2015   Procedure: APPLICATION OF CRANIAL NAVIGATION;  Surgeon: Consuella Lose, MD;  Location: [place] NEURO ORS;  Service: Neurosurgery;  Laterality: N/A;  APPLICATION OF CRANIAL NAVIGATION  . BILATERAL CARPAL TUNNEL RELEASE  1990,1991   left hand in 1990 right hand in '91  . CERVICAL SPINE SURGERY  05/1997   spinal fusion  . CHOLECYSTECTOMY  04/24/2008   status post laparoscopic Dr.Ely  . CRANIOTOMY Right 08/02/2015   Procedure: CRANIOTOMY TUMOR EXCISION;  Surgeon: Consuella Lose, MD;  Location: [place] NEURO ORS;  Service: Neurosurgery;  Laterality: Right;  CRANIOTOMY TUMOR EXCISION  . radioactive iodine    . THYROIDECTOMY, PARTIAL  1964   due to cancerous tumor  . TOE SURGERY Left 1989    Prior to Admission medications   Medication Sig Start Date End Date Taking? Authorizing Provider  acetaminophen (TYLENOL) 500 MG tablet Take 500 mg by mouth every 6 (six) hours as needed for mild pain or moderate pain.    [provider]  colchicine (COLCRYS) 0.6 MG tablet Take 1 tablet (0.6 mg total) by mouth daily. 02/25/17   Carmon Ginsberg, PA  glipiZIDE (GLUCOTROL) 5 MG tablet TAKE 1 TABLET BY MOUTH TWICE A DAY 30 MINUTES BEFORE A MEAL 02/15/17   Carmon Ginsberg, PA  levETIRAcetam (KEPPRA) 500 MG tablet Take 1 tablet (500 mg total) by mouth 2 (two) times daily. Reported on 12/11/2015 12/11/15   Forest Gleason,  MD  levothyroxine (SYNTHROID, LEVOTHROID) 100 MCG tablet TAKE 1 TABLET (100 MCG TOTAL) BY MOUTH DAILY. 02/15/17   Cammie Sickle, MD  lisinopril (PRINIVIL,ZESTRIL) 20 MG tablet Take 1 tablet (20 mg total) by mouth daily. 10/06/16   Carmon Ginsberg, PA  nortriptyline (PAMELOR) 10 MG capsule TAKE ONE TO THREE CAPSULES AT BEDTIME TO HELP WITH SLEEP AND NERVE PAIN 10/08/16   Carmon Ginsberg, PA  pravastatin (PRAVACHOL) 40 MG tablet Take 1 tablet (40 mg total) by mouth daily. 11/23/16   Carmon Ginsberg, PA   predniSONE (DELTASONE) 10 MG tablet Taper daily as follows: 6 pills, 5, 4, 3, 2, 1 02/25/17   Carmon Ginsberg, PA  promethazine (PHENERGAN) 25 MG tablet TAKE 1 TABLET (25 MG TOTAL) BY MOUTH EVERY 6 (SIX) HOURS AS NEEDED FOR NAUSEA OR VOMITING. 04/09/16   Cammie Sickle, MD  temozolomide (TEMODAR) 250 MG capsule Take 1 capsule by mouth for 5 days, repeat every 4 weeks. 07/17/16   Cammie Sickle, MD    Allergies  Allergen Reactions  . Ampicillin Other (See Comments)    Yeast rash   . Fish-Derived Products Other (See Comments)    gout  . Tomato Other (See Comments)    Upset stomach   . Penicillins Rash    Family History  Problem Relation Age of Onset  . Arthritis Mother   . Hyperlipidemia Mother   . Hypertension Mother   . Heart disease Mother   . Diabetes Mother        type 2  . Thyroid disease Mother   . Cancer Father        colon  . Alcohol abuse Brother   . Hyperlipidemia Brother   . Hypertension Brother   . Breast cancer Neg Hx   . Ovarian cancer Neg Hx     Social History Social History  Substance Use Topics  . Smoking status: Former Research scientist (life sciences)  . Smokeless tobacco: Never Used     Comment: quit smoking in 2007  . Alcohol use No    Review of Systems Constitutional: Negative for fever. Cardiovascular: Negative for chest pain. Respiratory: Negative for shortness of breath. Gastrointestinal: Negative for abdominal pain Musculoskeletal: Right ankle pain. Skin: Redness and swelling around the right ankle. Neurological: Mild headache currently. Denies any focal weakness or numbness. All other ROS negative  ____________________________________________   PHYSICAL EXAM:  VITAL SIGNS: ED Triage Vitals  Enc Vitals Group     BP 02/25/17 2049 (!) 160/105     Pulse Rate 02/25/17 2049 (!) 123     Resp 02/25/17 2049 18     Temp 02/25/17 2049 98.6 F (37 C)     Temp Source 02/25/17 2049 Oral     SpO2 02/25/17 2049 96 %     Weight 02/25/17 2050 160 lb (72.6  kg)     Height 02/25/17 2050 5\' 1"  (1.549 m)     Head Circumference --      Peak Flow --      Pain Score 02/25/17 2049 5     Pain Loc --      Pain Edu? --      Excl. in [place]? --     Constitutional: Alert and oriented. Well appearing and in no distress. Eyes: Normal exam ENT   Head: Normocephalic and atraumatic   Mouth/Throat: Mucous membranes are moist. Cardiovascular: Normal rate, regular rhythm. No murmur Respiratory: Normal respiratory effort without tachypnea nor retractions. Breath sounds are clear  Gastrointestinal: Soft and nontender. No  distention.   Musculoskeletal: Patient does have moderate tenderness to palpation of the right ankle with mild erythema around the ankle as well. Does appear somewhat consistent with gout she also has mild-to-moderate edema.  Neurologic:  Normal speech and language. No gross focal neurologic deficits Skin:  Skin is warm, dry and intact.  Psychiatric: Mood and affect are normal.   ____________________________________________   RADIOLOGY  IMPRESSION: 1. No acute intracranial abnormalities. 2. Chronic changes from a right temporal lobe resection through a right frontotemporal parietal craniotomy. 3. Chronic left-sided parenchymal calcifications.  ____________________________________________   INITIAL IMPRESSION / ASSESSMENT AND PLAN / ED COURSE  Pertinent labs & imaging results that were available during my care of the patient were reviewed by me and considered in my medical decision making (see chart for details).  Patient presents for intermittent headaches over the past 2-3 weeks history of glioblastoma status post resection patient is not currently on chemotherapy but plans to restart chemotherapy in October. CT head shows no acute change. Patient's lab work is largely at baseline with mild renal insufficiency. Patient is right ankle exam does appear consistent with inflammatory arthritis. Patient states a significant history of  gout in the past. However given her edema with history of cancer we'll obtain an ultrasound to rule out DVT. We will also obtain an x-ray to rule out traumatic injury. We will treat that 1 time dose of Toradol in the emergency department.  Patient is feeling better. Her labs are resulted showing an elevated uric acid level with an elevated white blood cell count. Given the patient's strong history of gout per palpation in the past we will treat with prednisone and have the patient follow-up with her doctor. Ultrasound is negative. ____________________________________________   FINAL CLINICAL IMPRESSION(S) / ED DIAGNOSES  Headache Right ankle pain    Harvest Dark, MD 02/26/17 [phone]

## 2017-03-02 ENCOUNTER — Inpatient Hospital Stay
Admission: EM | Admit: 2017-03-02 | Discharge: 2017-03-04 | DRG: 683 | Disposition: A | Discharge status: Home / Self Care | Payer: Medicare Other | Attending: Internal Medicine | Admitting: Internal Medicine

## 2017-03-02 ENCOUNTER — Other Ambulatory Visit: Payer: Self-pay

## 2017-03-02 ENCOUNTER — Encounter: Payer: Self-pay | Admitting: *Deleted

## 2017-03-02 DIAGNOSIS — I10 Essential (primary) hypertension: Secondary | ICD-10-CM | POA: Diagnosis not present

## 2017-03-02 DIAGNOSIS — E860 Dehydration: Secondary | ICD-10-CM | POA: Diagnosis not present

## 2017-03-02 DIAGNOSIS — Z66 Do not resuscitate: Secondary | ICD-10-CM | POA: Diagnosis present

## 2017-03-02 DIAGNOSIS — Z85850 Personal history of malignant neoplasm of thyroid: Secondary | ICD-10-CM

## 2017-03-02 DIAGNOSIS — Z981 Arthrodesis status: Secondary | ICD-10-CM

## 2017-03-02 DIAGNOSIS — N19 Unspecified kidney failure: Secondary | ICD-10-CM

## 2017-03-02 DIAGNOSIS — Z9889 Other specified postprocedural states: Secondary | ICD-10-CM

## 2017-03-02 DIAGNOSIS — Z923 Personal history of irradiation: Secondary | ICD-10-CM

## 2017-03-02 DIAGNOSIS — Z811 Family history of alcohol abuse and dependence: Secondary | ICD-10-CM

## 2017-03-02 DIAGNOSIS — C719 Malignant neoplasm of brain, unspecified: Secondary | ICD-10-CM

## 2017-03-02 DIAGNOSIS — G40909 Epilepsy, unspecified, not intractable, without status epilepticus: Secondary | ICD-10-CM | POA: Diagnosis not present

## 2017-03-02 DIAGNOSIS — N183 Chronic kidney disease, stage 3 (moderate): Secondary | ICD-10-CM | POA: Diagnosis present

## 2017-03-02 DIAGNOSIS — I129 Hypertensive chronic kidney disease with stage 1 through stage 4 chronic kidney disease, or unspecified chronic kidney disease: Secondary | ICD-10-CM | POA: Diagnosis present

## 2017-03-02 DIAGNOSIS — Z515 Encounter for palliative care: Secondary | ICD-10-CM | POA: Diagnosis not present

## 2017-03-02 DIAGNOSIS — G43909 Migraine, unspecified, not intractable, without status migrainosus: Secondary | ICD-10-CM | POA: Diagnosis present

## 2017-03-02 DIAGNOSIS — Z7189 Other specified counseling: Secondary | ICD-10-CM | POA: Diagnosis not present

## 2017-03-02 DIAGNOSIS — Z8349 Family history of other endocrine, nutritional and metabolic diseases: Secondary | ICD-10-CM | POA: Diagnosis not present

## 2017-03-02 DIAGNOSIS — R197 Diarrhea, unspecified: Secondary | ICD-10-CM

## 2017-03-02 DIAGNOSIS — M109 Gout, unspecified: Secondary | ICD-10-CM | POA: Diagnosis present

## 2017-03-02 DIAGNOSIS — R110 Nausea: Secondary | ICD-10-CM

## 2017-03-02 DIAGNOSIS — E871 Hypo-osmolality and hyponatremia: Secondary | ICD-10-CM | POA: Diagnosis not present

## 2017-03-02 DIAGNOSIS — Z9221 Personal history of antineoplastic chemotherapy: Secondary | ICD-10-CM | POA: Diagnosis not present

## 2017-03-02 DIAGNOSIS — R638 Other symptoms and signs concerning food and fluid intake: Secondary | ICD-10-CM

## 2017-03-02 DIAGNOSIS — Z800 Family history of malignant neoplasm of digestive organs: Secondary | ICD-10-CM

## 2017-03-02 DIAGNOSIS — E119 Type 2 diabetes mellitus without complications: Secondary | ICD-10-CM | POA: Diagnosis not present

## 2017-03-02 DIAGNOSIS — Z8249 Family history of ischemic heart disease and other diseases of the circulatory system: Secondary | ICD-10-CM | POA: Diagnosis not present

## 2017-03-02 DIAGNOSIS — Z85841 Personal history of malignant neoplasm of brain: Secondary | ICD-10-CM

## 2017-03-02 DIAGNOSIS — Z9049 Acquired absence of other specified parts of digestive tract: Secondary | ICD-10-CM | POA: Diagnosis not present

## 2017-03-02 DIAGNOSIS — E876 Hypokalemia: Secondary | ICD-10-CM | POA: Diagnosis present

## 2017-03-02 DIAGNOSIS — Z87891 Personal history of nicotine dependence: Secondary | ICD-10-CM | POA: Diagnosis not present

## 2017-03-02 DIAGNOSIS — Z79899 Other long term (current) drug therapy: Secondary | ICD-10-CM

## 2017-03-02 DIAGNOSIS — R630 Anorexia: Secondary | ICD-10-CM | POA: Diagnosis not present

## 2017-03-02 DIAGNOSIS — E890 Postprocedural hypothyroidism: Secondary | ICD-10-CM | POA: Diagnosis present

## 2017-03-02 DIAGNOSIS — Z833 Family history of diabetes mellitus: Secondary | ICD-10-CM | POA: Diagnosis not present

## 2017-03-02 DIAGNOSIS — Z8261 Family history of arthritis: Secondary | ICD-10-CM

## 2017-03-02 DIAGNOSIS — C712 Malignant neoplasm of temporal lobe: Secondary | ICD-10-CM | POA: Diagnosis not present

## 2017-03-02 DIAGNOSIS — R51 Headache: Secondary | ICD-10-CM | POA: Diagnosis not present

## 2017-03-02 DIAGNOSIS — F329 Major depressive disorder, single episode, unspecified: Secondary | ICD-10-CM | POA: Diagnosis not present

## 2017-03-02 DIAGNOSIS — R109 Unspecified abdominal pain: Secondary | ICD-10-CM | POA: Diagnosis not present

## 2017-03-02 DIAGNOSIS — E875 Hyperkalemia: Secondary | ICD-10-CM | POA: Diagnosis not present

## 2017-03-02 DIAGNOSIS — N179 Acute kidney failure, unspecified: Principal | ICD-10-CM | POA: Diagnosis present

## 2017-03-02 DIAGNOSIS — R140 Abdominal distension (gaseous): Secondary | ICD-10-CM | POA: Diagnosis present

## 2017-03-02 LAB — BASIC METABOLIC PANEL
ANION GAP: 16 — AB (ref 5–15)
BUN: 91 mg/dL — ABNORMAL HIGH (ref 6–20)
CHLORIDE: 94 mmol/L — AB (ref 101–111)
CO2: 20 mmol/L — AB (ref 22–32)
Calcium: 9.2 mg/dL (ref 8.9–10.3)
Creatinine, Ser: 3.01 mg/dL — ABNORMAL HIGH (ref 0.44–1.00)
GFR calc Af Amer: 17 mL/min — ABNORMAL LOW (ref >60)
GFR calc non Af Amer: 15 mL/min — ABNORMAL LOW (ref >60)
GLUCOSE: 176 mg/dL — AB (ref 65–99)
POTASSIUM: 3 mmol/L — AB (ref 3.5–5.1)
Sodium: 130 mmol/L — ABNORMAL LOW (ref 135–145)

## 2017-03-02 LAB — CBC
HEMATOCRIT: 42.8 % (ref 35.0–47.0)
HEMOGLOBIN: 15.1 g/dL (ref 12.0–16.0)
MCH: 30.8 pg (ref 26.0–34.0)
MCHC: 35.2 g/dL (ref 32.0–36.0)
MCV: 87.4 fL (ref 80.0–100.0)
Platelets: 196 10*3/uL (ref 150–440)
RBC: 4.9 MIL/uL (ref 3.80–5.20)
RDW: 12.8 % (ref 11.5–14.5)
WBC: 9.1 10*3/uL (ref 3.6–11.0)

## 2017-03-02 LAB — CK: Total CK: 239 U/L — ABNORMAL HIGH (ref 38–234)

## 2017-03-02 LAB — TSH: TSH: 5.842 u[IU]/mL — ABNORMAL HIGH (ref 0.350–4.500)

## 2017-03-02 LAB — T4, FREE: FREE T4: 0.97 ng/dL (ref 0.61–1.12)

## 2017-03-02 MED ORDER — GLIPIZIDE 5 MG PO TABS
5.0000 mg | ORAL_TABLET | Freq: Two times a day (BID) | ORAL | Status: DC
  Administered 2017-03-03 – 2017-03-04 (×3): 5 mg via ORAL
  Filled 2017-03-02 (×4): qty 1

## 2017-03-02 MED ORDER — ACETAMINOPHEN 650 MG RE SUPP: 650.0000 mg | Freq: Four times a day (QID) | RECTAL | Status: DC | PRN

## 2017-03-02 MED ORDER — PROMETHAZINE HCL 25 MG PO TABS
25.0000 mg | ORAL_TABLET | Freq: Four times a day (QID) | ORAL | Status: DC | PRN
  Filled 2017-03-02: qty 1

## 2017-03-02 MED ORDER — ENOXAPARIN SODIUM 40 MG/0.4ML ~~LOC~~ SOLN: 40.0000 mg | SUBCUTANEOUS | Status: DC

## 2017-03-02 MED ORDER — ENOXAPARIN SODIUM 30 MG/0.3ML ~~LOC~~ SOLN
30.0000 mg | SUBCUTANEOUS | Status: DC
  Administered 2017-03-03: 23:00:00 30 mg via SUBCUTANEOUS
  Filled 2017-03-02: qty 0.3

## 2017-03-02 MED ORDER — LEVOTHYROXINE SODIUM 50 MCG PO TABS
100.0000 ug | ORAL_TABLET | Freq: Every day | ORAL | Status: DC
  Administered 2017-03-03 – 2017-03-04 (×2): 100 ug via ORAL
  Filled 2017-03-02 (×2): qty 2

## 2017-03-02 MED ORDER — SODIUM CHLORIDE 0.9 % IV BOLUS (SEPSIS)
1000.0000 mL | Freq: Once | INTRAVENOUS | Status: AC
  Administered 2017-03-02: 1000 mL via INTRAVENOUS

## 2017-03-02 MED ORDER — ENOXAPARIN SODIUM 40 MG/0.4ML ~~LOC~~ SOLN: 30.0000 mg | SUBCUTANEOUS | Status: DC

## 2017-03-02 MED ORDER — LEVETIRACETAM 500 MG PO TABS
500.0000 mg | ORAL_TABLET | Freq: Two times a day (BID) | ORAL | Status: DC
  Administered 2017-03-02 – 2017-03-04 (×4): 500 mg via ORAL
  Filled 2017-03-02 (×5): qty 1

## 2017-03-02 MED ORDER — NORTRIPTYLINE HCL 10 MG PO CAPS
10.0000 mg | ORAL_CAPSULE | Freq: Every day | ORAL | Status: DC
  Administered 2017-03-02 – 2017-03-03 (×2): 10 mg via ORAL
  Filled 2017-03-02 (×3): qty 1

## 2017-03-02 MED ORDER — ONDANSETRON HCL 4 MG/2ML IJ SOLN
4.0000 mg | Freq: Once | INTRAMUSCULAR | Status: AC
  Administered 2017-03-02: 4 mg via INTRAVENOUS
  Filled 2017-03-02: qty 2

## 2017-03-02 MED ORDER — SODIUM CHLORIDE 0.9 % IV SOLN
INTRAVENOUS | Status: DC
  Administered 2017-03-02: via INTRAVENOUS

## 2017-03-02 MED ORDER — PRAVASTATIN SODIUM 40 MG PO TABS: 40.0000 mg | ORAL_TABLET | Freq: Every day | ORAL | Status: DC

## 2017-03-02 MED ORDER — ACETAMINOPHEN 325 MG PO TABS
650.0000 mg | ORAL_TABLET | Freq: Four times a day (QID) | ORAL | Status: DC | PRN
  Filled 2017-03-02: qty 2

## 2017-03-02 NOTE — H&P (Signed)
[place] [facility] [place] NAME: Martha [name]    MR#:  [id number]  DATE OF BIRTH:  1945/11/10  DATE OF ADMISSION:  03/02/2017  PRIMARY CARE PHYSICIAN: Carmon Ginsberg, PA   REQUESTING/REFERRING PHYSICIAN: Dr Gonzella Lex  CHIEF COMPLAINT:   Chief Complaint  Patient presents with  . Nausea  . Weakness    HISTORY OF PRESENT ILLNESS:  Martha [name]  is a 71 y.o. female with a known history of Glioblastoma presents with nausea and weakness. The patient states that she has not been feeling well. She's not been eating very much. She has a history of gout. She's been having nausea vomiting going on for the last few weeks. Difficult to walk with some right leg pain and also some right knee pain. She's been having some migraines with some auras. In the ER, she was found to have a creatinine of 3.01 and a sodium of 130 and hospitalists were contacted for evaluation.  PAST MEDICAL HISTORY:   Past Medical History:  Diagnosis Date  . Cancer [facility])    thyroid cancer treated with radioactive iodine  . Depression   . Diabetes mellitus without complication ([place])   . Epilepsy ([place])   . Glioblastoma determined by biopsy of brain ([place])   . Hyperlipidemia   . Hypertension   . Thyroid disease    h/o thyroid ca- 30 years ago  . Varicose vein     PAST SURGICAL HISTORY:   Past Surgical History:  Procedure Laterality Date  . APPLICATION OF CRANIAL NAVIGATION N/A 08/02/2015   Procedure: APPLICATION OF CRANIAL NAVIGATION;  Surgeon: Consuella Lose, MD;  Location: [place] [facility];  Service: Neurosurgery;  Laterality: N/A;  APPLICATION OF CRANIAL NAVIGATION  . BILATERAL CARPAL TUNNEL RELEASE  1990,1991   left hand in 1990 right hand in '91  . CERVICAL SPINE SURGERY  05/1997   spinal fusion  . CHOLECYSTECTOMY  04/24/2008   status post laparoscopic Dr.Ely  . CRANIOTOMY Right 08/02/2015   Procedure: CRANIOTOMY TUMOR EXCISION;  Surgeon: Consuella Lose, MD;  Location: [place] [facility];  Service: Neurosurgery;  Laterality: Right;  CRANIOTOMY TUMOR EXCISION  . radioactive iodine    . THYROIDECTOMY, PARTIAL  1964   due to cancerous tumor  . TOE SURGERY Left 1989    SOCIAL HISTORY:   Social History  Substance Use Topics  . Smoking status: Former Research scientist (life sciences)  . Smokeless tobacco: Never Used     Comment: quit smoking in 2007  . Alcohol use No    FAMILY HISTORY:   Family History  Problem Relation Age of Onset  . Arthritis Mother   . Hyperlipidemia Mother   . Hypertension Mother   . Heart disease Mother   . Diabetes Mother        type 2  . Thyroid disease Mother   . Cancer Father        colon  . Alcohol abuse Brother   . Hyperlipidemia Brother   . Hypertension Brother   . Breast cancer Neg Hx   . Ovarian cancer Neg Hx     DRUG ALLERGIES:   Allergies  Allergen Reactions  . Ampicillin Other (See Comments)    Yeast rash   . Fish-Derived Products Other (See Comments)    gout  . Tomato Other (See Comments)    Upset stomach   . Penicillins Rash    REVIEW OF SYSTEMS:  CONSTITUTIONAL: No feve positive for chills. Positive for weight gain in her abdomen.  EYES: No blurred or double vision.  EARS, NOSE, AND THROAT: No tinnitus or ear pain. No sore throat RESPIRATORY: No cough, shortness of breath, wheezing or hemoptysis.  CARDIOVASCULAR: No chest pain, orthopnea, edema.  GASTROINTESTINAL: Positive for  nausea, vomiting, and  abdominal pain. No blood in bowel movements GENITOURINARY: No dysuria, hematuria.  ENDOCRINE: No polyuria, nocturia.  positive for hypothyroidism  HEMATOLOGY: No anemia, easy bruising or bleeding SKIN: No rash or lesion. MUSCULOSKELETAL:  positive for leg pain  NEUROLOGIC:  positive tremor right arm  PSYCHIATRY: No anxiety or depression.   MEDICATIONS AT HOME:   Prior to Admission medications   Medication Sig Start Date End Date Taking? Authorizing Provider  acetaminophen (TYLENOL) 500 MG  tablet Take 500 mg by mouth every 6 (six) hours as needed for mild pain or moderate pain.   Yes [provider]  colchicine (COLCRYS) 0.6 MG tablet Take 1 tablet (0.6 mg total) by mouth daily. 02/25/17  Yes Chauvin, Herbie Baltimore, PA  glipiZIDE (GLUCOTROL) 5 MG tablet TAKE 1 TABLET BY MOUTH TWICE A DAY 30 MINUTES BEFORE A MEAL 02/15/17  Yes Carmon Ginsberg, PA  levETIRAcetam (KEPPRA) 500 MG tablet Take 1 tablet (500 mg total) by mouth 2 (two) times daily. Reported on 12/11/2015 12/11/15  Yes Choksi, Delorise Shiner, MD  levothyroxine (SYNTHROID, LEVOTHROID) 100 MCG tablet TAKE 1 TABLET (100 MCG TOTAL) BY MOUTH DAILY. 02/15/17  Yes Cammie Sickle, MD  lisinopril (PRINIVIL,ZESTRIL) 20 MG tablet Take 1 tablet (20 mg total) by mouth daily. 10/06/16  Yes Carmon Ginsberg, PA  nortriptyline (PAMELOR) 10 MG capsule TAKE ONE TO THREE CAPSULES AT BEDTIME TO HELP WITH SLEEP AND NERVE PAIN 10/08/16  Yes Carmon Ginsberg, PA  pravastatin (PRAVACHOL) 40 MG tablet Take 1 tablet (40 mg total) by mouth daily. 11/23/16  Yes Carmon Ginsberg, PA  predniSONE (DELTASONE) 20 MG tablet Take 2 tablets (40 mg total) by mouth daily. 02/26/17  Yes Harvest Dark, MD  promethazine (PHENERGAN) 25 MG tablet TAKE 1 TABLET (25 MG TOTAL) BY MOUTH EVERY 6 (SIX) HOURS AS NEEDED FOR NAUSEA OR VOMITING. 04/09/16  Yes Cammie Sickle, MD  temozolomide (TEMODAR) 250 MG capsule Take 1 capsule by mouth for 5 days, repeat every 4 weeks. 07/17/16  Yes Cammie Sickle, MD      VITAL SIGNS:  Blood pressure 120/73, pulse (!) 105, temperature 98 F (36.7 C), temperature source Oral, resp. rate 16, height 5\' 1"  (1.549 m), weight 72.6 kg (160 lb), SpO2 96 %.  PHYSICAL EXAMINATION:  GENERAL:  71 y.o.-year-old patient lying in the bed with no acute distress.  EYES: Pupils equal, round, reactive to light and accommodation. No scleral icterus. Extraocular muscles intact.  HEENT: Head atraumatic, normocephalic. Oropharynx and nasopharynx clear.   NECK:  Supple, no jugular venous distention. No thyroid enlargement, no tenderness.  LUNGS: Normal breath sounds bilaterally, no wheezing, rales,rhonchi or crepitation. No use of accessory muscles of respiration.  CARDIOVASCULAR: S1, S2 normal. No murmurs, rubs, or gallops.  ABDOMEN: Soft, nontender, distended. Bowel sounds present. No organomegaly or mass.  EXTREMITIES: No pedal edema, cyanosis, or clubbing.  NEUROLOGIC: Cranial nerves II through XII are intact. Muscle strength 5/5 in all extremities. Sensation intact. Gait not checked.  tremor right arm  PSYCHIATRIC: The patient is alert and oriented x 3.  SKIN: No rash, lesion, or ulcer.   LABORATORY PANEL:   CBC  Recent Labs Lab 03/02/17 1655  WBC 9.1  HGB 15.1  HCT 42.8  PLT 196   ------------------------------------------------------------------------------------------------------------------  Chemistries   Recent Labs Lab 02/25/17 2100 03/02/17 1655  NA 139 130*  K 3.5 3.0*  CL 104 94*  CO2 23 20*  GLUCOSE 223* 176*  BUN 25* 91*  CREATININE 1.32* 3.01*  CALCIUM 9.8 9.2  AST 20  --   ALT 30  --   ALKPHOS 112  --   BILITOT 1.4*  --    ------------------------------------------------------------------------------------------------------------------  Cardiac Enzymes  Recent Labs Lab 02/25/17 2100  TROPONINI <0.03   ------------------------------------------------------------------------------------------------------------------   EKG:   Sinus tachycardia 107 bpm   IMPRESSION AND PLAN:   1. Acute kidney injury and hyponatremia. IV fluid hydration with normal saline. Likely with poor appetite and nausea vomiting. Serial sodiums and creatinines. Baseline creatinine 0.98. 2. Leg pain. Stop pravastatin. Check a CPK. IV fluid hydration. Add on a uric acid because the patient states that she has a history of gout. Hold code cricks for right now with acute kidney injury. 3. History of glioblastoma with right  arm tremor. Unable to get an MRI of the brain with contrast secondary to acute kidney injury. I will get oncology consultation. Patient once to be a full code.  4. Abdominal distention. Unable to get a CAT scan with contrast secondary to acute kidney injury. I will start off with a sonogram of the abdomen and kidneys.  5. Hypokalemia potassium replacement orally 6. Seizure disorder on Keppra 7. Hypothyroidism unspecified and history of thyroid cancer. Continue levothyroxine 8. Type 2 diabetes on glipizide at home. I will put on sliding scale here.   All the records are reviewed and case discussed with ED provider. Management plans discussed with the patient and she is in agreement.  CODE STATUS: Full code   TOTAL TIME TAKING CARE OF THIS PATIENT: 50  minutes.    Loletha Grayer M.D on 03/02/2017 at 9:28 PM  Between 7am to 6pm - Pager - [phone]  After 6pm call admission pager [phone]  [facility] Office  [phone]  CC: Primary care physician; Carmon Ginsberg, [place]

## 2017-03-02 NOTE — Progress Notes (Addendum)
Lovenox changed to 30 mg daily for BMI <40 and CrCl <30. 7/26 Changed back to 40 mg daily for improved renal function.

## 2017-03-02 NOTE — ED Provider Notes (Signed)
[facility] Emergency Department Provider Note  ____________________________________________  Time seen: Approximately 7:47 PM  I have reviewed the triage vital signs and the nursing notes.   HISTORY  Chief Complaint Nausea and Weakness   HPI Martha [name] is a 71 y.o. female with a history of glioblastoma status post resection in January 2017 currently off of chemotherapy and radiation, diabetes, seizure disorder, hypertension, hyperlipidemia, and thyroid cancer status post resection 30 years ago who presents for evaluation of nausea and generalized weakness. Patient was seen here 4 days ago for headaches and had a CT of her head with no acute findings. Patient reports for the last 2 weeks she has had no appetite and has been severely nauseated. She also has been having diarrhea every time she eats anything. She has had 4-5 episodes of watery diarrhea a day for 10 days at least. No prior history of C. difficile and no recent antibiotic use. Patient has a dry cough but no shortness of breath or chest pain, no fever. She does endorse chills. No abdominal pain. No vomiting but has had severe nausea. Patient has a significantly decreased appetite for the last 2 weeks.No dysuria or hematuria.  Past Medical History:  Diagnosis Date  . Cancer [facility])    thyroid cancer treated with radioactive iodine  . Depression   . Diabetes mellitus without complication ([place])   . Epilepsy ([place])   . Glioblastoma determined by biopsy of brain ([place])   . Hyperlipidemia   . Hypertension   . Thyroid disease    h/o thyroid ca- 30 years ago  . Varicose vein     Patient Active Problem List   Diagnosis Date Noted  . Right hip pain 10/14/2016  . Thyroid cancer ([place]) 10/14/2016  . CKD (chronic kidney disease) stage 3, GFR 30-59 ml/min 07/06/2016  . Gout 04/06/2016  . Glioblastoma determined by biopsy of brain ([place]) 08/27/2015  . S/P craniotomy 08/02/2015  . HLD (hyperlipidemia)  12/24/2014  . Adult hypothyroidism 12/24/2014  . Headache, migraine 12/24/2014  . Essential hypertension 05/29/2014  . Type 2 diabetes mellitus ([place]) 05/29/2014  . Seizure disorder ([place]) 05/29/2014  . Adiposity 05/29/2014    Past Surgical History:  Procedure Laterality Date  . APPLICATION OF CRANIAL NAVIGATION N/A 08/02/2015   Procedure: APPLICATION OF CRANIAL NAVIGATION;  Surgeon: Consuella Lose, MD;  Location: [place] [facility]UR[facility] ORS;  Service: Neurosurgery;  Laterality: N/A;  APPLICATION OF CRANIAL NAVIGATION  . BILATERAL CARPAL TUNNEL RELEASE  1990,1991   left hand in 1990 right hand in '91  . CERVICAL SPINE SURGERY  05/1997   spinal fusion  . CHOLECYSTECTOMY  04/24/2008   status post laparoscopic Dr.Ely  . CRANIOTOMY Right 08/02/2015   Procedure: CRANIOTOMY TUMOR EXCISION;  Surgeon: Consuella Lose, MD;  Location: [place] [facility]UR[facility]S;  Service: Neurosurgery;  Laterality: Right;  CRANIOTOMY TUMOR EXCISION  . radioactive iodine    . THYROIDECTOMY, PARTIAL  1964   due to cancerous tumor  . TOE SURGERY Left 1989    Prior to Admission medications   Medication Sig Start Date End Date Taking? Authorizing Provider  acetaminophen (TYLENOL) 500 MG tablet Take 500 mg by mouth every 6 (six) hours as needed for mild pain or moderate pain.   Yes [provider]  colchicine (COLCRYS) 0.6 MG tablet Take 1 tablet (0.6 mg total) by mouth daily. 02/25/17  Yes Chauvin, Herbie [place], PA  glipiZIDE (GLUCOTROL) 5 MG tablet TAKE 1 TABLET BY MOUTH TWICE A DAY 30 MINUTES BEFORE A  MEAL 02/15/17  Yes Carmon Ginsberg, PA  levETIRAcetam (KEPPRA) 500 MG tablet Take 1 tablet (500 mg total) by mouth 2 (two) times daily. Reported on 12/11/2015 12/11/15  Yes Choksi, Delorise Shiner, MD  levothyroxine (SYNTHROID, LEVOTHROID) 100 MCG tablet TAKE 1 TABLET (100 MCG TOTAL) BY MOUTH DAILY. 02/15/17  Yes Cammie Sickle, MD  lisinopril (PRINIVIL,ZESTRIL) 20 MG tablet Take 1 tablet (20 mg total) by mouth daily. 10/06/16  Yes Carmon Ginsberg,  PA  nortriptyline (PAMELOR) 10 MG capsule TAKE ONE TO THREE CAPSULES AT BEDTIME TO HELP WITH SLEEP AND NERVE PAIN 10/08/16  Yes Carmon Ginsberg, PA  pravastatin (PRAVACHOL) 40 MG tablet Take 1 tablet (40 mg total) by mouth daily. 11/23/16  Yes Carmon Ginsberg, PA  predniSONE (DELTASONE) 20 MG tablet Take 2 tablets (40 mg total) by mouth daily. 02/26/17  Yes Harvest Dark, MD  promethazine (PHENERGAN) 25 MG tablet TAKE 1 TABLET (25 MG TOTAL) BY MOUTH EVERY 6 (SIX) HOURS AS NEEDED FOR NAUSEA OR VOMITING. 04/09/16  Yes Cammie Sickle, MD  temozolomide (TEMODAR) 250 MG capsule Take 1 capsule by mouth for 5 days, repeat every 4 weeks. 07/17/16  Yes Cammie Sickle, MD    Allergies Ampicillin; Fish-derived products; Tomato; and Penicillins  Family History  Problem Relation Age of Onset  . Arthritis Mother   . Hyperlipidemia Mother   . Hypertension Mother   . Heart disease Mother   . Diabetes Mother        type 2  . Thyroid disease Mother   . Cancer Father        colon  . Alcohol abuse Brother   . Hyperlipidemia Brother   . Hypertension Brother   . Breast cancer Neg Hx   . Ovarian cancer Neg Hx     Social History Social History  Substance Use Topics  . Smoking status: Former Research scientist (life sciences)  . Smokeless tobacco: Never Used     Comment: quit smoking in 2007  . Alcohol use No    Review of Systems  Constitutional: Negative for fever. + [place] and generalized weakness Eyes: Negative for visual changes. ENT: Negative for sore throat. Neck: No neck pain  Cardiovascular: Negative for chest pain. Respiratory: Negative for shortness of breath. Gastrointestinal: Negative for abdominal pain, vomiting. + nausea, and diarrhea. Genitourinary: Negative for dysuria. Musculoskeletal: Negative for back pain. Skin: Negative for rash. Neurological: Negative for headaches, weakness or numbness. Psych: No SI or HI  ____________________________________________   PHYSICAL EXAM:  VITAL  SIGNS: ED Triage Vitals  Enc Vitals Group     BP 03/02/17 1650 120/73     Pulse Rate 03/02/17 1650 (!) 105     Resp 03/02/17 1650 16     Temp 03/02/17 1650 98 F (36.7 C)     Temp Source 03/02/17 1650 Oral     SpO2 03/02/17 1650 96 %     Weight 03/02/17 1650 160 lb (72.6 kg)     Height 03/02/17 1650 5\' 1"  (1.549 m)     Head Circumference --      Peak Flow --      Pain Score 03/02/17 1649 7     Pain Loc --      Pain Edu? --      Excl. in [place]? --     Constitutional: Alert and oriented. Well appearing and in no apparent distress. HEENT:      Head: Normocephalic and atraumatic.         Eyes: Conjunctivae are normal. Sclera is  non-icteric.       Mouth/Throat: Mucous membranes are dry.       Neck: Supple with no signs of meningismus. Cardiovascular: Tachycardic with regular rhythm. No murmurs, gallops, or rubs. 2+ symmetrical distal pulses are present in all extremities. No JVD. Respiratory: Normal respiratory effort. Lungs are clear to auscultation bilaterally. No wheezes, crackles, or rhonchi.  Gastrointestinal: Soft, non tender, and non distended with positive bowel sounds. No rebound or guarding. Musculoskeletal: Nontender with normal range of motion in all extremities. No edema, cyanosis, or erythema of extremities. Neurologic: Normal speech and language. Face is symmetric. Moving all extremities. No gross focal neurologic deficits are appreciated. Skin: Skin is warm, dry and intact. No rash noted. Psychiatric: Mood and affect are normal. Speech and behavior are normal.  ____________________________________________   LABS (all labs ordered are listed, but only abnormal results are displayed)  Labs Reviewed  BASIC METABOLIC PANEL - Abnormal; Notable for the following:       Result Value   Sodium 130 (*)    Potassium 3.0 (*)    Chloride 94 (*)    CO2 20 (*)    Glucose, Bld 176 (*)    BUN 91 (*)    Creatinine, Ser 3.01 (*)    GFR calc non Af Amer 15 (*)    GFR calc Af  Amer 17 (*)    Anion gap 16 (*)    All other components within normal limits  TSH - Abnormal; Notable for the following:    TSH 5.842 (*)    All other components within normal limits  CBC  T4, FREE  URINALYSIS, COMPLETE (UACMP) WITH MICROSCOPIC  CBG MONITORING, ED   ____________________________________________  EKG  ED ECG REPORT I, Rudene Re, the attending physician, personally viewed and interpreted this ECG.  Sinus tachycardia, rate of 107, normal intervals, normal axis, no ST elevations or depressions.  ____________________________________________  RADIOLOGY  none  ____________________________________________   PROCEDURES  Procedure(s) performed: None Procedures Critical Care performed:  None ____________________________________________   INITIAL IMPRESSION / ASSESSMENT AND PLAN / ED COURSE  71 y.o. female with a history of glioblastoma status post resection in January 2017 currently off of chemotherapy and radiation, diabetes, seizure disorder, hypertension, hyperlipidemia, and thyroid cancer status post resection 30 years ago who presents for evaluation of nausea and generalized weakness. Patient looks is slightly dry with dry mucous membranes and tachycardic. She is neurologically intact and has had a CT of her head done 4 days ago which was negative, therefore I don't think patient needs a repeat head CT at this time. Her labs show acute kidney injury with creatinine of 3.01 and elevated BUN of 91. Patient also slightly hyponatremic with sodium of 130 and potassium of 3. Patient has an anion gap of 16 and bicarbonate of 20 consistent with dehydration. Unclear why patient is so nauseated. EKG with no ischemic changes. UA, thyroid, C. Diff are pending. Will give IVF and admit to the Hospitalist service.      Pertinent labs & imaging results that were available during my care of the patient were reviewed by me and considered in my medical decision making (see  chart for details).    ____________________________________________   FINAL CLINICAL IMPRESSION(S) / ED DIAGNOSES  Final diagnoses:  Dehydration  Acute kidney injury (HCC)  Uremia, acute  Nausea  Diarrhea, unspecified type      NEW MEDICATIONS STARTED DURING THIS VISIT:  New Prescriptions   No medications on file  Note:  This document was prepared using Dragon voice recognition software and may include unintentional dictation errors.    Rudene Re, MD 03/02/17 2116

## 2017-03-02 NOTE — ED Triage Notes (Signed)
Pt reports having increased weakness and shaking throughout the past two weeks. Pt was seen for migraines and Gout a week ago and reports the gout has improved and she has not had a migraine in two days. Pt reports still having head pain at this time though. Pt also reports not being able to eat due to the increased nausea. Small amount of vomiting reported. Pt has hx of brain cancer but stopped treatments 3 months ago. No neuro deficits noted at this time, no slurred speech. Pts friend reports she has had worsening memory.

## 2017-03-03 ENCOUNTER — Inpatient Hospital Stay: Payer: Medicare Other

## 2017-03-03 DIAGNOSIS — C712 Malignant neoplasm of temporal lobe: Secondary | ICD-10-CM

## 2017-03-03 DIAGNOSIS — E119 Type 2 diabetes mellitus without complications: Secondary | ICD-10-CM

## 2017-03-03 DIAGNOSIS — G40909 Epilepsy, unspecified, not intractable, without status epilepticus: Secondary | ICD-10-CM

## 2017-03-03 DIAGNOSIS — Z7189 Other specified counseling: Secondary | ICD-10-CM

## 2017-03-03 DIAGNOSIS — E875 Hyperkalemia: Secondary | ICD-10-CM

## 2017-03-03 DIAGNOSIS — I10 Essential (primary) hypertension: Secondary | ICD-10-CM

## 2017-03-03 DIAGNOSIS — Z515 Encounter for palliative care: Secondary | ICD-10-CM

## 2017-03-03 DIAGNOSIS — F329 Major depressive disorder, single episode, unspecified: Secondary | ICD-10-CM

## 2017-03-03 DIAGNOSIS — N179 Acute kidney failure, unspecified: Principal | ICD-10-CM

## 2017-03-03 DIAGNOSIS — R630 Anorexia: Secondary | ICD-10-CM

## 2017-03-03 DIAGNOSIS — Z87891 Personal history of nicotine dependence: Secondary | ICD-10-CM

## 2017-03-03 DIAGNOSIS — R51 Headache: Secondary | ICD-10-CM

## 2017-03-03 DIAGNOSIS — Z9221 Personal history of antineoplastic chemotherapy: Secondary | ICD-10-CM

## 2017-03-03 DIAGNOSIS — Z923 Personal history of irradiation: Secondary | ICD-10-CM

## 2017-03-03 DIAGNOSIS — R638 Other symptoms and signs concerning food and fluid intake: Secondary | ICD-10-CM

## 2017-03-03 DIAGNOSIS — Z85850 Personal history of malignant neoplasm of thyroid: Secondary | ICD-10-CM

## 2017-03-03 DIAGNOSIS — C719 Malignant neoplasm of brain, unspecified: Secondary | ICD-10-CM

## 2017-03-03 LAB — BASIC METABOLIC PANEL
Anion gap: 13 (ref 5–15)
BUN: 78 mg/dL — AB (ref 6–20)
CALCIUM: 8.6 mg/dL — AB (ref 8.9–10.3)
CO2: 21 mmol/L — ABNORMAL LOW (ref 22–32)
CREATININE: 2.02 mg/dL — AB (ref 0.44–1.00)
Chloride: 104 mmol/L (ref 101–111)
GFR calc Af Amer: 28 mL/min — ABNORMAL LOW (ref >60)
GFR, EST NON AFRICAN AMERICAN: 24 mL/min — AB (ref >60)
GLUCOSE: 115 mg/dL — AB (ref 65–99)
POTASSIUM: 2.6 mmol/L — AB (ref 3.5–5.1)
SODIUM: 138 mmol/L (ref 135–145)

## 2017-03-03 LAB — URINALYSIS, COMPLETE (UACMP) WITH MICROSCOPIC
Bacteria, UA: NONE SEEN
Bilirubin Urine: NEGATIVE
GLUCOSE, UA: NEGATIVE mg/dL
KETONES UR: NEGATIVE mg/dL
LEUKOCYTES UA: NEGATIVE
Nitrite: NEGATIVE
PROTEIN: NEGATIVE mg/dL
Specific Gravity, Urine: 1.013 (ref 1.005–1.030)
pH: 5 (ref 5.0–8.0)

## 2017-03-03 LAB — CBC
HEMATOCRIT: 37.1 % (ref 35.0–47.0)
Hemoglobin: 13.4 g/dL (ref 12.0–16.0)
MCH: 32 pg (ref 26.0–34.0)
MCHC: 36.1 g/dL — AB (ref 32.0–36.0)
MCV: 88.7 fL (ref 80.0–100.0)
PLATELETS: 156 10*3/uL (ref 150–440)
RBC: 4.19 MIL/uL (ref 3.80–5.20)
RDW: 12.4 % (ref 11.5–14.5)
WBC: 8.1 10*3/uL (ref 3.6–11.0)

## 2017-03-03 LAB — POTASSIUM: POTASSIUM: 3.3 mmol/L — AB (ref 3.5–5.1)

## 2017-03-03 LAB — TSH: TSH: 3.4 u[IU]/mL (ref 0.350–4.500)

## 2017-03-03 LAB — URIC ACID: Uric Acid, Serum: 16.1 mg/dL — ABNORMAL HIGH (ref 2.3–6.6)

## 2017-03-03 MED ORDER — ENSURE ENLIVE PO LIQD
237.0000 mL | Freq: Three times a day (TID) | ORAL | Status: DC
  Administered 2017-03-03 – 2017-03-04 (×3): 237 mL via ORAL

## 2017-03-03 MED ORDER — COLCHICINE 0.6 MG PO TABS
0.6000 mg | ORAL_TABLET | Freq: Every day | ORAL | Status: DC
  Administered 2017-03-03 – 2017-03-04 (×2): 0.6 mg via ORAL
  Filled 2017-03-03 (×2): qty 1

## 2017-03-03 MED ORDER — POTASSIUM CHLORIDE IN NACL 20-0.9 MEQ/L-% IV SOLN
INTRAVENOUS | Status: DC
  Administered 2017-03-03 – 2017-03-04 (×2): via INTRAVENOUS
  Filled 2017-03-03 (×6): qty 1000

## 2017-03-03 MED ORDER — ALLOPURINOL 100 MG PO TABS
100.0000 mg | ORAL_TABLET | Freq: Every day | ORAL | Status: DC
  Administered 2017-03-03 – 2017-03-04 (×2): 100 mg via ORAL
  Filled 2017-03-03 (×2): qty 1

## 2017-03-03 MED ORDER — POTASSIUM CHLORIDE CRYS ER 20 MEQ PO TBCR
40.0000 meq | EXTENDED_RELEASE_TABLET | Freq: Once | ORAL | Status: AC
  Administered 2017-03-03: 40 meq via ORAL
  Filled 2017-03-03: qty 2

## 2017-03-03 NOTE — Progress Notes (Signed)
Pt has on falls risk and allergy armband. Paged MD about critical K of 2.6. Awaiting callback. Will continue to monitor.

## 2017-03-03 NOTE — Care Management Note (Addendum)
Case Management Note  Patient Details  Name: Martha [name] MRN: [id number] Date of Birth: August 19, 1945  Subjective/Objective:      Admitted to [facility] with the diagnosis of acute kidney injury. Lives alone. Friend is Chauncy Lean [phone]). Last seen Dr. Carleene Overlie 02/15/17. [facility] to the [facility] and sees Dr. Rogue Bussing. Last chemotherapy was about a month ago. Can't remember name of drug store. No home health. No skilled facility. No home oxygen. Uses a cane to aid in ambulation. Takes care of all basic activities of daily living herself, doesn't drive anymore. Friend helps with errands. Last fall was 2 weeks ago, Decreased appetite. Lost 15 pounds in the last 2 weeks.                 Action/Plan:  Will continue to follow for discharge needs.   Expected Discharge Date:                  Expected Discharge Plan:     In-House Referral:     Discharge planning Services     Post Acute Care Choice:    Choice offered to:     DME Arranged:    DME Agency:     HH Arranged:    HH Agency:     Status of Service:     If discussed at [employer], dates discussed:    Additional Comments:  Shelbie Ammons, [place] Management [phone] 03/03/2017, 10:51 AM

## 2017-03-03 NOTE — Progress Notes (Signed)
Patient ID: Reynold Bowen, female   DOB: 1946/02/17, 71 y.o.   MRN: [id number]  [facility] PROGRESS NOTE  LEOMA FOLDS ATF:[id number] DOB: Aug 23, 1945 DOA: 03/02/2017 PCP: Carmon Ginsberg, PA  HPI/Subjective: Patient feeling a little bit better today. Less pain in her right knee. No pain in the legs. Eating a little bit more. States he in short taste good.  Objective: Vitals:   03/03/17 0423 03/03/17 1344  BP: 105/67 (!) 102/58  Pulse: 95 95  Resp: 16 17  Temp: 97.7 F (36.5 C) 97.8 F (36.6 C)    Filed Weights   03/02/17 1650 03/02/17 2315  Weight: 72.6 kg (160 lb) 72 kg (158 lb 12.8 oz)    ROS: Review of Systems  Constitutional: Negative for chills and fever.  Eyes: Negative for blurred vision.  Respiratory: Negative for cough and shortness of breath.   Cardiovascular: Negative for chest pain.  Gastrointestinal: Positive for nausea. Negative for abdominal pain, constipation, diarrhea and vomiting.  Genitourinary: Negative for dysuria.  Musculoskeletal: Positive for joint pain.  Neurological: Negative for dizziness and headaches.   Exam: Physical Exam  Constitutional: She is oriented to person, place, and time.  HENT:  Nose: No mucosal edema.  Mouth/Throat: No oropharyngeal exudate or posterior oropharyngeal edema.  Eyes: Pupils are equal, round, and reactive to light. Conjunctivae, EOM and lids are normal.  Neck: No JVD present. Carotid bruit is not present. No edema present. No thyroid mass and no thyromegaly present.  Cardiovascular: S1 normal and S2 normal.  Exam reveals no gallop.   No murmur heard. Pulses:      Dorsalis pedis pulses are 2+ on the right side, and 2+ on the left side.  Respiratory: No respiratory distress. She has no wheezes. She has no rhonchi. She has no rales.  GI: Soft. Bowel sounds are normal. There is no tenderness.  Musculoskeletal:       Right ankle: She exhibits no swelling.       Left ankle: She exhibits no swelling.   Lymphadenopathy:    She has no cervical adenopathy.  Neurological: She is alert and oriented to person, place, and time. No cranial nerve deficit.  Skin: Skin is warm. No rash noted. Nails show no clubbing.  Psychiatric: She has a normal mood and affect.      Data Reviewed: Basic Metabolic Panel:  Recent Labs Lab 02/25/17 2100 03/02/17 1655 03/03/17 0449 03/03/17 1234  NA 139 130* 138  --   K 3.5 3.0* 2.6* 3.3*  CL 104 94* 104  --   CO2 23 20* 21*  --   GLUCOSE 223* 176* 115*  --   BUN 25* 91* 78*  --   CREATININE 1.32* 3.01* 2.02*  --   CALCIUM 9.8 9.2 8.6*  --    Liver Function Tests:  Recent Labs Lab 02/25/17 2100  AST 20  ALT 30  ALKPHOS 112  BILITOT 1.4*  PROT 7.5  ALBUMIN 4.2   CBC:  Recent Labs Lab 02/25/17 2100 03/02/17 1655 03/03/17 0449  WBC 15.9* 9.1 8.1  NEUTROABS 12.7*  --   --   HGB 14.3 15.1 13.4  HCT 41.1 42.8 37.1  MCV 90.2 87.4 88.7  PLT 273 196 156   Cardiac Enzymes:  Recent Labs Lab 02/25/17 2100 03/02/17 2318  CKTOTAL  --  88*  TROPONINI <0.03  --      Studies: US Abdomen Complete  Result Date: 03/03/2017 CLINICAL DATA:  Abdominal pain, acute renal injury. Previous cholecystectomy  EXAM: ABDOMEN ULTRASOUND COMPLETE COMPARISON:  Abdominal ultrasound dated January 22, 2017 FINDINGS: Gallbladder: The gallbladder is surgically absent. Common bile duct: Diameter: 4.7 mm Liver: The hepatic echotexture is mildly increased. There is no focal mass nor ductal dilation. The surface contour of the liver is smooth where visualized. IVC: No abnormality visualized. Pancreas: Visualized portion unremarkable. Spleen: Size and appearance within normal limits. Right Kidney: Length: 10.8 cm. The renal cortical echotexture remains lower than that of the liver. There is no hydronephrosis. Left Kidney: Length: 11.0 cm. The renal cortical echotexture is similar to that on the right. There is no hydronephrosis. There is a midpole cortical cyst measuring  3.9 cm in diameter. . Abdominal aorta: The proximal aorta measures 3.4 cm AP x 3.9 cm transversely. The mid and distal aorta exhibit normal tapering caliber. Other findings: There is no ascites. IMPRESSION: There is no hydronephrosis nor other acute renal abnormality observed. Mildly increased hepatic echotexture is compatible with fatty infiltrative change. This is stable. Previous cholecystectomy. Mild dilation of the proximal abdominal aorta to 3.9 cm in the transverse plane. Recommend followup by ultrasound in 2 years. This recommendation follows ACR consensus guidelines: White Paper of the ACR Incidental Findings Committee II on Vascular Findings. J Am Coll Radiol 2013; 10:789-794. Electronically Signed   By: David  Martinique M.D.   On: 03/03/2017 09:10    Scheduled Meds: . allopurinol  100 mg Oral Daily  . colchicine  0.6 mg Oral Daily  . enoxaparin (LOVENOX) injection  30 mg Subcutaneous Q24H  . feeding supplement (ENSURE ENLIVE)  237 mL Oral TID BM  . glipiZIDE  5 mg Oral BID AC  . levETIRAcetam  500 mg Oral BID  . levothyroxine  100 mcg Oral QAC breakfast  . nortriptyline  10 mg Oral QHS   Continuous Infusions: . 0.9 % NaCl with KCl 20 mEq / L 100 mL/hr at 03/03/17 0755    Assessment/Plan:   1. Acute kidney injury and hyponatremia. Continue IV fluid hydration with normal saline. Creatinine has improved from yesterday. Continue to monitor creatinine on a daily basis. Hyponatremia has improved to normal. Continue to watch appetite. Patient eating a little bit better. Did well with Ensure. 2. Leg pain, right knee pain. CPK only slightly elevated. Continue to hold pravastatin at this point. Uric acid very high restart colchicine and renally dosed allopurinol. 3. Glioblastoma. Right arm tremor better today. Appreciate palliative care consultation. Awaiting oncology consultation. 4. Abdominal distention. Sonogram of the was not revealing. Unable to get CAT scan with contrast secondary to acute  kidney injury 5. Hypokalemia. Replace potassium in IV fluids 6. Seizure disorder on Keppra 7. Hypothyroidism unspecified and history of thyroid cancer on levothyroxine 8. Type 2 diabetes mellitus on glipizide 9. Physical therapy evaluation ordered  Code Status:     Code Status Orders        Start     Ordered   03/02/17 2130  Full code  Continuous     03/02/17 2130    Code Status History    Date Active Date Inactive Code Status Order ID Comments User Context   08/02/2015  1:53 PM 08/05/2015  8:14 PM Full Code [id number]  Consuella Lose, MD Inpatient   07/27/2015  9:26 PM 08/02/2015  1:53 PM DNR [id number]  Bethena Roys, MD Inpatient    Advance Directive Documentation     Most Recent Value  Type of Advance Directive  Healthcare Power of Attorney, Living will  Pre-existing out of facility DNR order (  yellow form or pink MOST form)  -  "MOST" Form in Place?  -     Family Communication: Permission to speak in front of friends at the bedside Disposition Plan: Potentially home in the next day or so depending on strength  Consultants:  Oncology  Palliative care  Time spent: 25 minutes  [place], [place]

## 2017-03-03 NOTE — Consult Note (Signed)
Consultation Note Date: 03/03/2017   Patient Name: Martha [name]  DOB: [birth year]  MRN: [id number]  Age / Sex: 71 y.o., female  PCP: Carmon Ginsberg, [place] Referring Physician: Loletha Grayer, MD  Reason for Consultation: Establishing goals of care  HPI/Patient Profile: 71 y.o. female  with past medical history of glioblastoma with resection 08/05/15 s/p radiation and chemotherapy (follow with Dr. Burlene Arnt), h/o thyroid cancer 1964, migraines, depression, epilepsy, HTN, HLD admitted on 03/02/2017 with nausea, vomiting, weakness, and diarrhea. Found to have acute kidney injury (improving), hypokalemia, nausea/vomiting, diarrhea, and poor intake x 3 weeks.   Clinical Assessment and Goals of Care: I met today with Izora Gala along with her friend and support [name] [facility] mother was present but did not participate in conversation). Rona grew up in [place] (her parents and brother have all passed, has nephews but not a relationship with them). 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 [name] is very independent and enjoys being on her computer.   Both Izora Gala and Mary's main concerns today was for Alithea's ability to care for herself. Rylee wishes to stay in her home as long as she is able and this is her main goal (although she recognizes that this may not always be possible). She has a two story home with bedrooms and only shower/both on the second floor. She has been sleeping on the couch on the first floor. Discussed having a bed downstairs and having caregivers that can assist with helping her to the bath/shower that is up the stairs periodically. Will have [facility] look into resources but Jamie-Lee is willing to hire private caregivers (and for housekeeping as well). Discussed  looking into meals on wheels as well. Also considering if there is a pharmacy that can package her medications or have someone help fill pill boxes to help Aryn take what she is meant to be taking. She has had some short term memory issues which is understandable given her history.   We discussed and I provided paperwork for her to complete HCPOA (she has chosen friend [name]). We also discussed the possibility of her cancer progressing and she plans to follow up with Dr. Rogue Bussing. They asked about repeat MRI but understand will need to wait for this given her acute kidney injury. I began to discuss Advance Directives and Living Will and the importance of Mardelle sharing her wishes with [facility] regarding EOL. They tell me that she has already made preparations for body donation to [facility] and cremation and where to place her ashes. Izora Gala comments "I don't believe in mercy killings." Leslye is tired and overwhelmed at this point and says "I like to just keep things simple." I will follow up with her again tomorrow.   Discussed with RN, [facility] Hassan Rowan, and Dr. Rogue Bussing.   Primary Decision Maker PATIENT    SUMMARY OF RECOMMENDATIONS   - Hopeful  to return home with max resources and support - Follow up with oncology  Code Status/Advance Care Planning:  Full code - did not discuss today   Symptom Management:   Nausea/vomiting/diarrhea: Improved since hospitalized  Poor intake: Continue Ensure as she enjoyed and tolerated these. Encouraged soft and bland meals until better tolerating po.   Weakness: PT to see.   Anxiety: Hoping this improves with more assistance at home.   Palliative Prophylaxis:   Bowel Regimen, Delirium Protocol and Frequent Pain Assessment  Additional Recommendations (Limitations, Scope, Preferences):  Full Scope Treatment  Psycho-social/Spiritual:   Desire for further Chaplaincy support:yes  Additional Recommendations: Caregiving  Support/Resources and Referral to  Community Resources   Prognosis: Prognosis overall is poor with h/o glioblastoma (19 mo post-op)  Discharge Planning: Home with Palliative Services      Primary Diagnoses: Present on Admission: . Acute kidney injury ([place])   I have reviewed the medical record, interviewed the patient and family, and examined the patient. The following aspects are pertinent.  Past Medical History:  Diagnosis Date  . Cancer [facility])    thyroid cancer treated with radioactive iodine  . Depression   . Diabetes mellitus without complication ([place])   . Epilepsy ([place])   . Glioblastoma determined by biopsy of brain ([place])   . Hyperlipidemia   . Hypertension   . Thyroid disease    h/o thyroid ca- 30 years ago  . Varicose vein    Social History   Social History  . Marital status: Single    Spouse name: N/A  . Number of children: N/A  . Years of education: N/A   Social History Main Topics  . Smoking status: Former Research scientist (life sciences)  . Smokeless tobacco: Never Used     Comment: quit smoking in 2007  . Alcohol use No  . Drug use: No  . Sexual activity: No   Other Topics Concern  . None   Social History Narrative  . None   Family History  Problem Relation Age of Onset  . Arthritis Mother   . Hyperlipidemia Mother   . Hypertension Mother   . Heart disease Mother   . Diabetes Mother        type 2  . Thyroid disease Mother   . Cancer Father        colon  . Alcohol abuse Brother   . Hyperlipidemia Brother   . Hypertension Brother   . Breast cancer Neg Hx   . Ovarian cancer Neg Hx    Scheduled Meds: . allopurinol  100 mg Oral Daily  . colchicine  0.6 mg Oral Daily  . enoxaparin (LOVENOX) injection  30 mg Subcutaneous Q24H  . feeding supplement (ENSURE ENLIVE)  237 mL Oral TID BM  . glipiZIDE  5 mg Oral BID AC  . levETIRAcetam  500 mg Oral BID  . levothyroxine  100 mcg Oral QAC breakfast  . nortriptyline  10 mg Oral QHS   Continuous Infusions: . 0.9 % NaCl with KCl 20 mEq / L 100 mL/hr at  03/03/17 0755   PRN Meds:.acetaminophen **OR** acetaminophen, promethazine Allergies  Allergen Reactions  . Ampicillin Other (See Comments)    Yeast rash   . Fish-Derived Products Other (See Comments)    gout  . Tomato Other (See Comments)    Upset stomach   . Penicillins Rash   Review of Systems  Constitutional: Positive for activity change, appetite change and unexpected weight change.  Gastrointestinal: Positive for abdominal distention, diarrhea, nausea and  vomiting.  Neurological: Positive for weakness.    Physical Exam  Constitutional: She is oriented to person, place, and time. She appears well-developed.  Able to go from lying to sitting on side of bed without any assistance  HENT:  Head: Normocephalic and atraumatic.  Cardiovascular: Normal rate and regular rhythm.   Pulmonary/Chest: Effort normal and breath sounds normal. No accessory muscle usage. No tachypnea. No respiratory distress.  Abdominal: She exhibits distension.  Neurological: She is alert and oriented to person, place, and time.  Nursing note and vitals reviewed.   Vital Signs: BP 105/67 (BP Location: Right Arm)   Pulse 95   Temp 97.7 F (36.5 C) (Oral)   Resp 16   Ht 5' 1"  (1.549 m)   Wt 72 kg (158 lb 12.8 oz)   SpO2 94%   BMI 30.00 kg/m  Pain Assessment: 0-10   Pain Score: 0-No pain   SpO2: SpO2: 94 % O2 Device:SpO2: 94 % O2 Flow Rate: .   IO: Intake/output summary:  Intake/Output Summary (Last 24 hours) at 03/03/17 1322 Last data filed at 03/03/17 0933  Gross per 24 hour  Intake          1418.33 ml  Output              750 ml  Net           668.33 ml    LBM: Last BM Date: 03/01/17 Baseline Weight: Weight: 72.6 kg (160 lb) Most recent weight: Weight: 72 kg (158 lb 12.8 oz)     Palliative Assessment/Data: 50%    Time Total: 48mn  Greater than 50%  of this time was spent counseling and coordinating care related to the above assessment and plan.  Signed by: AVinie Sill  NP Palliative Medicine Team Pager # [phone](M-F 8a-5p) Team Phone # [phone](Nights/Weekends)

## 2017-03-03 NOTE — Consult Note (Signed)
[facility] CONSULT NOTE  Patient Care Team: Carmon Ginsberg, [place] as PCP - General (Family Medicine)  CHIEF COMPLAINTS/PURPOSE OF CONSULTATION:  GBM  HISTORY OF PRESENTING ILLNESS:  Martha [name] 71 y.o.  female history of GBM right temporal lobe resection in 2017 status post adjuvant chemoradiation; followed by maintenance Temodar- which was stopped approximately 2 months ago given poor tolerance to Temodar-with nausea vomiting. Patient's MRI May 23rd- stable no evidence of progression.  Patient states that she was having intermittent headaches; and gait instability- the last 2 weeks or so. Patient also had increased nausea and vomiting. No diarrhea or constipation. Patient had been also having "pain in her knee"-she had been taking Motrin/Aleve up to 2 or 3 a day. Also had decreased by mouth intake. She in fact fell hitting the right elbow on the glass table. No trauma to the head.  On the arrival to the emergency room patient was noted to have - creatinine of 3/acute renal failure. Patient has been admitted for further evaluation/management.  ROS:  complaints of tremors. No fever no chills.A complete 10 point review of system is done which is negative except mentioned above in history of present illness  MEDICAL HISTORY:  Past Medical History:  Diagnosis Date  . Cancer [facility])    thyroid cancer treated with radioactive iodine  . Depression   . Diabetes mellitus without complication ([place])   . Epilepsy ([place])   . Glioblastoma determined by biopsy of brain ([place])   . Hyperlipidemia   . Hypertension   . Thyroid disease    h/o thyroid ca- 30 years ago  . Varicose vein     SURGICAL HISTORY: Past Surgical History:  Procedure Laterality Date  . APPLICATION OF CRANIAL NAVIGATION N/A 08/02/2015   Procedure: APPLICATION OF CRANIAL NAVIGATION;  Surgeon: Consuella Lose, MD;  Location: [place] NEURO ORS;  Service: Neurosurgery;  Laterality: N/A;  APPLICATION OF CRANIAL  NAVIGATION  . BILATERAL CARPAL TUNNEL RELEASE  1990,1991   left hand in 1990 right hand in '91  . CERVICAL SPINE SURGERY  05/1997   spinal fusion  . CHOLECYSTECTOMY  04/24/2008   status post laparoscopic Dr.Ely  . CRANIOTOMY Right 08/02/2015   Procedure: CRANIOTOMY TUMOR EXCISION;  Surgeon: Consuella Lose, MD;  Location: [place] NEURO ORS;  Service: Neurosurgery;  Laterality: Right;  CRANIOTOMY TUMOR EXCISION  . radioactive iodine    . THYROIDECTOMY, PARTIAL  1964   due to cancerous tumor  . TOE SURGERY Left 1989    SOCIAL HISTORY: Social History   Social History  . Marital status: Single    Spouse name: N/A  . Number of children: N/A  . Years of education: N/A   Occupational History  . Not on file.   Social History Main Topics  . Smoking status: Former Research scientist (life sciences)  . Smokeless tobacco: Never Used     Comment: quit smoking in 2007  . Alcohol use No  . Drug use: No  . Sexual activity: No   Other Topics Concern  . Not on file   Social History Narrative  . No narrative on file    FAMILY HISTORY: Family History  Problem Relation Age of Onset  . Arthritis Mother   . Hyperlipidemia Mother   . Hypertension Mother   . Heart disease Mother   . Diabetes Mother        type 2  . Thyroid disease Mother   . Cancer Father        colon  . Alcohol  abuse Brother   . Hyperlipidemia Brother   . Hypertension Brother   . Breast cancer Neg Hx   . Ovarian cancer Neg Hx     ALLERGIES:  is allergic to ampicillin; fish-derived products; tomato; and penicillins.  MEDICATIONS:  Current Facility-Administered Medications  Medication Dose Route Frequency Provider Last Rate Last Dose  . 0.9 % NaCl with KCl 20 mEq/ L  infusion   Intravenous Continuous Saundra Shelling, MD 100 mL/hr at 03/03/17 0755    . acetaminophen (TYLENOL) tablet 650 mg  650 mg Oral Q6H PRN Loletha Grayer, MD       Or  . acetaminophen (TYLENOL) suppository 650 mg  650 mg Rectal Q6H PRN Wieting, Richard, MD      .  allopurinol (ZYLOPRIM) tablet 100 mg  100 mg Oral Daily Loletha Grayer, MD   100 mg at 03/03/17 0929  . colchicine tablet 0.6 mg  0.6 mg Oral Daily Loletha Grayer, MD   0.6 mg at 03/03/17 0929  . enoxaparin (LOVENOX) injection 30 mg  30 mg Subcutaneous Q24H Wieting, Richard, MD      . feeding supplement (ENSURE ENLIVE) (ENSURE ENLIVE) liquid 237 mL  237 mL Oral TID BM Loletha Grayer, MD   237 mL at 03/03/17 1405  . glipiZIDE (GLUCOTROL) tablet 5 mg  5 mg Oral BID AC Loletha Grayer, MD   5 mg at 03/03/17 1657  . levETIRAcetam (KEPPRA) tablet 500 mg  500 mg Oral BID Loletha Grayer, MD   500 mg at 03/03/17 0929  . levothyroxine (SYNTHROID, LEVOTHROID) tablet 100 mcg  100 mcg Oral QAC breakfast Loletha Grayer, MD   100 mcg at 03/03/17 9983  . nortriptyline (PAMELOR) capsule 10 mg  10 mg Oral QHS Loletha Grayer, MD   10 mg at 03/02/17 2350  . promethazine (PHENERGAN) tablet 25 mg  25 mg Oral Q6H PRN Loletha Grayer, MD          .  PHYSICAL EXAMINATION:  Vitals:   03/03/17 0423 03/03/17 1344  BP: 105/67 (!) 102/58  Pulse: 95 95  Resp: 16 17  Temp: 97.7 F (36.5 C) 97.8 F (36.6 C)   Filed Weights   03/02/17 1650 03/02/17 2315  Weight: 160 lb (72.6 kg) 158 lb 12.8 oz (72 kg)    GENERAL: Well-nourished well-developed; Alert, no distress and comfortable.   Accompanied by a friend.  EYES: no pallor or icterus OROPHARYNX: no thrush or ulceration. NECK: supple, no masses felt LYMPH:  no palpable lymphadenopathy in the cervical, axillary or inguinal regions LUNGS: decreased breath sounds to auscultation at bases and  No wheeze or crackles HEART/CVS: regular rate & rhythm and no murmurs; No lower extremity edema ABDOMEN: abdomen soft, non-tender and normal bowel sounds Musculoskeletal:no cyanosis of digits and no clubbing  PSYCH: alert & oriented x 3 with fluent speech NEURO: no focal motor/sensory deficits; tremor noted of the right upper extremity.  SKIN:  no rashes or  significant lesions  LABORATORY DATA:  I have reviewed the data as listed Lab Results  Component Value Date   WBC 8.1 03/03/2017   HGB 13.4 03/03/2017   HCT 37.1 03/03/2017   MCV 88.7 03/03/2017   PLT 156 03/03/2017    Recent Labs  11/16/16 1355 12/14/16 1452 02/25/17 2100 03/02/17 1655 03/03/17 0449 03/03/17 1234  NA 139 140 139 130* 138  --   K 3.5 3.6 3.5 3.0* 2.6* 3.3*  CL 106 107 104 94* 104  --   CO2 26 27 23  20*  21*  --   GLUCOSE 193* 183* 223* 176* 115*  --   BUN 26* 23* 25* 91* 78*  --   CREATININE 0.95 0.98 1.32* 3.01* 2.02*  --   CALCIUM 9.6 9.3 9.8 9.2 8.6*  --   GFRNONAA 59* 57* 40* 15* 24*  --   GFRAA >60 >60 46* 17* 28*  --   PROT 7.0 6.6 7.5  --   --   --   ALBUMIN 4.3 3.8 4.2  --   --   --   AST 25 22 20   --   --   --   ALT 37 32 30  --   --   --   ALKPHOS 89 97 112  --   --   --   BILITOT 0.8 0.8 1.4*  --   --   --     RADIOGRAPHIC STUDIES: I have personally reviewed the radiological images as listed and agreed with the findings in the report. Dg Ankle Complete Right  Result Date: 02/26/2017 CLINICAL DATA:  71 year old female with right ankle pain and swelling and redness. EXAM: RIGHT ANKLE - COMPLETE 3+ VIEW COMPARISON:  None. FINDINGS: There is no acute fracture or dislocation. The bones are osteopenic. The ankle mortise is intact. There is diffuse soft tissue swelling and skin thickening of the foot which may represent cellulitis. Clinical correlation is recommended. No radiopaque foreign object. IMPRESSION: 1. No acute osseous pathology. 2. Diffuse skin thickening and soft tissue swelling of the foot may represent cellulitis. Clinical correlation is recommended. Electronically Signed   By: Anner Crete M.D.   On: 02/26/2017 01:51   Ct Head Wo Contrast  Result Date: 02/25/2017 CLINICAL DATA:  Pt presents to ED via POV in w/c with c/o migraine headache x2 weeks, and Gout flare-up pain in bilateral knee, knees, and feet. Pt reports h/x of  migraines, current headache is similar. Of note, pt is currently undergoing Chemo t/x for brain cancer (last chemo t/s was 3 months ago). Pt reports (+) nausea, but denies V/D; no fever. Denies CP or SHOB. Hand grips strong and equal; MAEW; no slurred speech or facial droop. EXAM: CT HEAD WITHOUT CONTRAST TECHNIQUE: Contiguous axial images were obtained from the base of the skull through the vertex without intravenous contrast. COMPARISON:  Brain MRI, 12/25/2016.  Head CT, 07/31/2015. FINDINGS: Brain: No masses. No evidence of an acute infarct. No hydrocephalus. No acute intracranial hemorrhage. Status post right craniotomy with resection of portion of the underlying right temporal lobe. There is chronic appearing encephalomalacia stable from the prior MRI. There are multiple parenchymal dystrophic appearing calcifications, which are noted on the left present on the prior CT scan. Vascular: No hyperdense vessel or unexpected calcification. Skull: No acute skull fracture. No skull lesion. Well-aligned craniotomy flap. Sinuses/Orbits: Visualize globes and orbits are unremarkable. Visualized sinuses and mastoid air cells are clear. Other: None. IMPRESSION: 1. No acute intracranial abnormalities. 2. Chronic changes from a right temporal lobe resection through a right frontotemporal parietal craniotomy. 3. Chronic left-sided parenchymal calcifications. Electronically Signed   By: Lajean Manes M.D.   On: 02/25/2017 21:20   US Abdomen Complete  Result Date: 03/03/2017 CLINICAL DATA:  Abdominal pain, acute renal injury. Previous cholecystectomy EXAM: ABDOMEN ULTRASOUND COMPLETE COMPARISON:  Abdominal ultrasound dated January 22, 2017 FINDINGS: Gallbladder: The gallbladder is surgically absent. Common bile duct: Diameter: 4.7 mm Liver: The hepatic echotexture is mildly increased. There is no focal mass nor ductal dilation. The surface contour of the  liver is smooth where visualized. IVC: No abnormality visualized.  Pancreas: Visualized portion unremarkable. Spleen: Size and appearance within normal limits. Right Kidney: Length: 10.8 cm. The renal cortical echotexture remains lower than that of the liver. There is no hydronephrosis. Left Kidney: Length: 11.0 cm. The renal cortical echotexture is similar to that on the right. There is no hydronephrosis. There is a midpole cortical cyst measuring 3.9 cm in diameter. . Abdominal aorta: The proximal aorta measures 3.4 cm AP x 3.9 cm transversely. The mid and distal aorta exhibit normal tapering caliber. Other findings: There is no ascites. IMPRESSION: There is no hydronephrosis nor other acute renal abnormality observed. Mildly increased hepatic echotexture is compatible with fatty infiltrative change. This is stable. Previous cholecystectomy. Mild dilation of the proximal abdominal aorta to 3.9 cm in the transverse plane. Recommend followup by ultrasound in 2 years. This recommendation follows ACR consensus guidelines: White Paper of the ACR Incidental Findings Committee II on Vascular Findings. J Am Coll Radiol 2013; 10:789-794. Electronically Signed   By: David  Martinique M.D.   On: 03/03/2017 09:10   US Venous Img Lower Unilateral Right  Result Date: 02/26/2017 CLINICAL DATA:  Right leg swelling and pain EXAM: RIGHT LOWER EXTREMITY VENOUS DOPPLER ULTRASOUND TECHNIQUE: Gray-scale sonography with graded compression, as well as color Doppler and duplex ultrasound were performed to evaluate the lower extremity deep venous systems from the level of the common femoral vein and including the common femoral, femoral, profunda femoral, popliteal and calf veins including the posterior tibial, peroneal and gastrocnemius veins when visible. The superficial great saphenous vein was also interrogated. Spectral Doppler was utilized to evaluate flow at rest and with distal augmentation maneuvers in the common femoral, femoral and popliteal veins. COMPARISON:  None. FINDINGS: Contralateral  Common Femoral Vein: Respiratory phasicity is normal and symmetric with the symptomatic side. No evidence of thrombus. Normal compressibility. Common Femoral Vein: No evidence of thrombus. Normal compressibility, respiratory phasicity and response to augmentation. Saphenofemoral Junction: No evidence of thrombus. Normal compressibility and flow on color Doppler imaging. Profunda Femoral Vein: No evidence of thrombus. Normal compressibility and flow on color Doppler imaging. Femoral Vein: No evidence of thrombus. Normal compressibility, respiratory phasicity and response to augmentation. Popliteal Vein: No evidence of thrombus. Normal compressibility, respiratory phasicity and response to augmentation. Calf Veins: No evidence of thrombus. Normal compressibility and flow on color Doppler imaging. Superficial Great Saphenous Vein: No evidence of thrombus. Normal compressibility and flow on color Doppler imaging. Venous Reflux:  None. Other Findings:  None. IMPRESSION: No evidence of DVT within the right lower extremity. Electronically Signed   By: Ashley Royalty M.D.   On: 02/26/2017 02:36   US Aorta  Result Date: 02/03/2017 CLINICAL DATA:  Aneurysm on recent ultrasound EXAM: ULTRASOUND OF ABDOMINAL AORTA TECHNIQUE: Ultrasound examination of the abdominal aorta was performed to evaluate for abdominal aortic aneurysm. COMPARISON:  01/22/2017 FINDINGS: Abdominal aortic measurements as follows: Proximal:  3.2 cm cm Mid:  1.8 cm cm Distal:  1.6 cm cm IMPRESSION: Proximal dilatation with normal distal tapering. No infrarenal aortic aneurysm is seen. Electronically Signed   By: Inez Catalina M.D.   On: 02/03/2017 10:58    ASSESSMENT & PLAN:  # 71 year old female patient with history of GBM- is currently admitted to the hospital for poor by mouth intake/nausea vomiting/acute renal failure.  # GBM- right temporal lobe status post chemoradiation; currently off adjuvant Temodar approximately a month ago; given the  stability on the MRI end of May 2018. It  is unclear if patient has progression of the disease in the brain that has led to current symptoms. We'll recommend MRI of the brain once the renal function improves.  # Acute renal failure- appears prerenal/improving on hydration. Today creatinine is 2/improving. Kidney ultrasound-no evidence of hydronephrosis.  # Gait instability/tremors/ " migraine headaches"- question related to underlying progression of GBM vs other causes [pt had similar symptoms even when MRI brain in May 2018- was stable]. Recommend Neurology evaluation.   # Agree with palliative care evaluation. Discussed with palliative care nurse.   Thank you Dr. Earleen Newport for allowing me to participate in the care of your pleasant patient. Please do not hesitate to contact me with questions or concerns in the interim. All questions were answered.     Cammie Sickle, MD 03/03/2017 6:24 PM

## 2017-03-03 NOTE — Progress Notes (Signed)
Initial Nutrition Assessment  DOCUMENTATION CODES:   Obesity unspecified  INTERVENTION:  Recommend liberalizing diet to regular.  Recommend Ensure Enlive po TID, each supplement provides 350 kcal and 20 grams of protein.   Encouraged patient to eat small, frequent meals with calorie- and protein-dense foods.   NUTRITION DIAGNOSIS:   Inadequate oral intake related to poor appetite, nausea, vomiting as evidenced by per patient/family report.  GOAL:   Patient will meet greater than or equal to 90% of their needs   MONITOR:   PO intake, Supplement acceptance, Labs, Weight trends, I & O's  REASON FOR ASSESSMENT:   Malnutrition Screening Tool    ASSESSMENT:   71 year old female with PMHx of HTN, DM type 2, HLD, depression, thyroid disease, hx of thyroid cancer, glioblastoma who presents with nausea and weakness admitted with AKI and hyponatremia.   Spoke with patient at bedside. She reports her appetite has been poor for the past month. She was having abdominal distention (which she reports has now improved), nausea, vomiting, and abdominal pain. She cannot tolerate tomatoes or spicy foods. She reports that for the past few weeks she has not been eating food. She has been sipping beverages such as water in [place]. Patient did finish her eggs on her breakfast tray this morning and reports she was able to tolerate that.   Patient reports her UBW was 180 lbs. Per review of weights in chart patient was only up to 170-177 lb range in 2016. She was 167.5 lbs on 01/07/2017 and has lost 8.7 lbs (5.2% body weight) over the past 2 months, which is not significant for time frame.  Medications reviewed and include: allopurinol, glipizide 5 mg BID, Keppra, levothyroxine, NS with KCl 20 mEq/L @ 100 ml/hr.  Labs reviewed: Potassium 3.3 (trending up from 2.6 this AM), CO2 21, BUN 78, Creatinine 2.02.   Nutrition-Focused physical exam completed. Findings are mild-moderate fat depletion in upper  arm region, no muscle depletion, and no edema.   Patient is at risk for acute malnutrition, but does not meet the criteria at this time.  Diet Order:  Diet regular Room service appropriate? Yes; Fluid consistency: Thin  Skin:  Reviewed, no issues  Last BM:  03/01/2017  Height:   Ht Readings from Last 1 Encounters:  03/02/17 5\' 1"  (1.549 m)    Weight:   Wt Readings from Last 1 Encounters:  03/02/17 158 lb 12.8 oz (72 kg)    Ideal Body Weight:  47.7 kg  BMI:  Body mass index is 30 kg/m.  Estimated Nutritional Needs:   Kcal:  1540-1775 (MSJ x 1.3-1.5)  Protein:  86-108 grams (1.2-1.5 grams/kg)  Fluid:  1.8 L/day (25 ml/kg)  EDUCATION NEEDS:   Education needs addressed  Willey Blade, MS, RD, LDN Pager: [phone] After Hours Pager: [phone]

## 2017-03-03 NOTE — Progress Notes (Signed)
[facility] responded to an OR for an AD education. Ivy educated on the AD. Pt will inform when ready to complete. Pt indicated 03/04/17.    03/03/17 1500  Clinical Encounter Type  Visited With Patient  Visit Type Initial;Spiritual support  Referral From Nurse  Consult/Referral To Chaplain  Spiritual Encounters  Spiritual Needs Literature

## 2017-03-03 NOTE — Progress Notes (Signed)
PT Contraindication Note  Patient Details Name: Martha [name] MRN: [id number] DOB: 10-20-1945   Evaluation contraindicated:    Reason Eval/Treat Not Completed: Medical issues which prohibited therapy. Chart reviewed. Critical potassium of 2.6 reported this AM. Currently being corrected. Contraindicated for PT evaluation at this time. Will attempt PT evaluation once pt is medically stabilized and appropriate for evaluation.   Lyndel Safe Adeja Sarratt PT, DPT   Audra Bellard 03/03/2017, 10:48 AM

## 2017-03-04 ENCOUNTER — Telehealth: Payer: Self-pay | Admitting: Family Medicine

## 2017-03-04 LAB — BASIC METABOLIC PANEL
ANION GAP: 7 (ref 5–15)
BUN: 42 mg/dL — AB (ref 6–20)
CHLORIDE: 113 mmol/L — AB (ref 101–111)
CO2: 22 mmol/L (ref 22–32)
Calcium: 8.4 mg/dL — ABNORMAL LOW (ref 8.9–10.3)
Creatinine, Ser: 1.14 mg/dL — ABNORMAL HIGH (ref 0.44–1.00)
GFR calc Af Amer: 55 mL/min — ABNORMAL LOW (ref >60)
GFR calc non Af Amer: 48 mL/min — ABNORMAL LOW (ref >60)
GLUCOSE: 98 mg/dL (ref 65–99)
POTASSIUM: 3.2 mmol/L — AB (ref 3.5–5.1)
Sodium: 142 mmol/L (ref 135–145)

## 2017-03-04 LAB — MAGNESIUM: Magnesium: 1.8 mg/dL (ref 1.7–2.4)

## 2017-03-04 MED ORDER — PROMETHAZINE HCL 25 MG PO TABS: 25.0000 mg | ORAL_TABLET | Freq: Four times a day (QID) | ORAL | 0 refills | Status: DC | PRN

## 2017-03-04 MED ORDER — POTASSIUM CHLORIDE ER 20 MEQ PO TBCR: 20.0000 meq | EXTENDED_RELEASE_TABLET | Freq: Every day | ORAL | 0 refills | Status: DC

## 2017-03-04 MED ORDER — ENOXAPARIN SODIUM 40 MG/0.4ML ~~LOC~~ SOLN: 40.0000 mg | SUBCUTANEOUS | Status: DC

## 2017-03-04 MED ORDER — POTASSIUM CHLORIDE CRYS ER 20 MEQ PO TBCR
40.0000 meq | EXTENDED_RELEASE_TABLET | Freq: Once | ORAL | Status: AC
  Administered 2017-03-04: 09:00:00 40 meq via ORAL
  Filled 2017-03-04: qty 2

## 2017-03-04 MED ORDER — ALLOPURINOL 100 MG PO TABS: 200.0000 mg | ORAL_TABLET | Freq: Every day | ORAL | Status: DC

## 2017-03-04 MED ORDER — ENSURE ENLIVE PO LIQD: 237.0000 mL | Freq: Three times a day (TID) | ORAL | 0 refills | Status: AC

## 2017-03-04 MED ORDER — ALLOPURINOL 100 MG PO TABS: 200.0000 mg | ORAL_TABLET | Freq: Every day | ORAL | 0 refills | Status: DC

## 2017-03-04 NOTE — Discharge Summary (Signed)
[facility] NAME: Hanh Kertesz    MR#:  [id number]  DATE OF BIRTH:  1945/12/23  DATE OF ADMISSION:  03/02/2017 ADMITTING PHYSICIAN: Loletha Grayer, MD  DATE OF DISCHARGE: 03/04/2017  PRIMARY CARE PHYSICIAN: Carmon Ginsberg, PA    ADMISSION DIAGNOSIS:  Dehydration [E86.0] Abdominal distension [R14.0] Nausea [R11.0] Uremia, acute [N19] Acute kidney injury ([place]) [N17.9] Diarrhea, unspecified type [R19.7]  DISCHARGE DIAGNOSIS:  Active Problems:   Acute kidney injury ([place])   Glioblastoma ([place])   Poor fluid intake   Goals of care, counseling/discussion   Palliative care encounter   SECONDARY DIAGNOSIS:   Past Medical History:  Diagnosis Date  . Cancer [facility])    thyroid cancer treated with radioactive iodine  . Depression   . Diabetes mellitus without complication ([place])   . Epilepsy ([place])   . 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 Hyperlipidemia   . Hypertension   . Thyroid disease    h/o thyroid ca- 30 years ago  . Varicose vein     HOSPITAL COURSE:   1.  Acute kidney injury and hyponatremia. This is secondary to not eating well at home. The patient has been given IV fluid hydration during the hospital course. Creatinine went from 3.01to 2.02 down to 1.14. Her baseline creatinine is 0.98. Her sodium with IV fluid hydration went from 1:30 up to 142. 2. Hypokalemia the patient was replaced the potassium orally and IV during the hospital course. She will need potassium at home. 3. Leg pain and right knee pain. Patient was given IV fluid hydration. I held the pravastatin and I will continue to hold this upon discharge home. Uric acid very high. Started allopurinol and continue colchicine. 4. History of glioblastoma. Patient also had right arm tremor in the emergency room which seems better. Appreciate palliative care consultation. Palliative care to follow as outpatient. Appreciate oncology follow-up. Oncology  will probably get an MRI of the brain with contrast as outpatient. I would not do it at this point with the acute renal failure. 5. Seizure disorder on Keppra 6. Hypothyroidism unspecified and history of thyroid cancer on levothyroxine 7. Type 2 diabetes mellitus on glipizide 8. Weakness. Physical therapy recommended home with home health. We'll set up PT, OT, aide and social work. Care manager set up hospital bed since she has a 2 floor house and once sestamibi first floor. 9. Palliative care to follow-up as outpatient 10. Continue to hold antihypertensive medications 11. Ultrasound of the abdomen showing proximal abdominal aorta mild dilation to 3.9 cm. Recommend follow-up ultrasound in 2 years  DISCHARGE CONDITIONS:   Satisfactory  CONSULTS OBTAINED:  Treatment Team:  Cammie Sickle, MD  DRUG ALLERGIES:   Allergies  Allergen Reactions  . Ampicillin Other (See Comments)    Yeast rash   . Fish-Derived Products Other (See Comments)    gout  . Tomato Other (See Comments)    Upset stomach   . Penicillins Rash    DISCHARGE MEDICATIONS:   Current Discharge Medication List    START taking these medications   Details  allopurinol (ZYLOPRIM) 100 MG tablet Take 2 tablets (200 mg total) by mouth daily. Qty: 60 tablet, Refills: 0    feeding supplement, ENSURE ENLIVE, (ENSURE ENLIVE) LIQD Take 237 mLs by mouth 3 (three) times daily between meals. Qty: 90 Bottle, Refills: 0    potassium chloride 20 MEQ TBCR Take 20 mEq by mouth daily. Qty: 10 tablet, Refills: 0  CONTINUE these medications which have CHANGED   Details  promethazine (PHENERGAN) 25 MG tablet Take 1 tablet (25 mg total) by mouth every 6 (six) hours as needed for nausea or vomiting. 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 Qty: 90 tablet, Refills: 3    levETIRAcetam (KEPPRA) 500 MG tablet Take 1 tablet (500 mg total) by mouth 2 (two) times daily. 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 Qty: 30 tablet, Refills: 3   Associated Diagnoses: Postoperative hypothyroidism    nortriptyline (PAMELOR) 10 MG capsule TAKE ONE TO THREE CAPSULES AT BEDTIME TO HELP WITH SLEEP AND NERVE PAIN Qty: 30 capsule, Refills: 0   Associated Diagnoses: Herpes zoster without complication    glipiZIDE (GLUCOTROL) 5 MG tablet TAKE 1 TABLET BY MOUTH TWICE A DAY 30 MINUTES BEFORE A MEAL Qty: 60 tablet, Refills: 3   Associated Diagnoses: Type 2 diabetes mellitus with stage 3 chronic kidney disease, without long-term current use of insulin ([facility])      STOP taking these medications     aspirin-acetaminophen-caffeine (EXCEDRIN MIGRAINE) 250-250-65 MG tablet      lisinopril (PRINIVIL,ZESTRIL) 20 MG tablet      pravastatin (PRAVACHOL) 40 MG tablet      predniSONE (DELTASONE) 20 MG tablet      temozolomide (TEMODAR) 250 MG capsule          DISCHARGE INSTRUCTIONS:   Follow-up with PMD one week Follow-up with oncology as outpatient  If you experience worsening of your admission symptoms, develop shortness of breath, life threatening emergency, suicidal or homicidal thoughts you must seek medical attention immediately by calling 911 or calling your MD immediately  if symptoms less severe.  You Must read complete instructions/literature along with all the possible adverse reactions/side effects for all the Medicines you take and that have been prescribed to  you. Take any new Medicines after you have completely understood and accept all the possible adverse reactions/side effects.   Please note  You were cared for by a hospitalist during your hospital stay. If you have any questions about your discharge medications or the care you received while you were in the hospital after you are discharged, you can call the unit and asked to speak with the hospitalist on call if the hospitalist that took care of you is not available. Once you are discharged, your primary care physician will handle any further medical issues. Please note that NO REFILLS for any discharge medications will be authorized once you are discharged, as it is imperative that you return to your primary care physician (or establish a relationship with a primary care physician if you do not have one) for your aftercare needs so that they can reassess your need for medications and monitor your lab values.    Today   CHIEF COMPLAINT:   Chief Complaint  Patient presents with  . Nausea  . Weakness    HISTORY OF PRESENT  ILLNESS:  Zuleima Haser  is a 71 y.o. female presented with nausea and weakness and found to have acute kidney injury, hyponatremia and hypokalemia   VITAL SIGNS:  Blood pressure 120/78, pulse 94, temperature (!) 97.5 F (36.4 C), temperature source Oral, resp. rate (!) 22, height 5\' 1"  (1.549 m), weight 72 kg (158 lb 12.8 oz), SpO2 95 %.  Respirations 16 when I saw her  espirations when I saw her 1 PHYSICAL EXAMINATION:  GENERAL:  71 y.o.-year-old patient lying in the bed with no acute distress.  EYES: Pupils equal, round, reactive to light and accommodation. No scleral icterus. Extraocular muscles intact.  HEENT: Head atraumatic, normocephalic. Oropharynx and nasopharynx clear.  NECK:  Supple, no jugular venous distention. No thyroid enlargement, no tenderness.  LUNGS: Normal breath sounds bilaterally, no wheezing, rales,rhonchi or crepitation. No use of  accessory muscles of respiration.  CARDIOVASCULAR: S1, S2 normal. No murmurs, rubs, or gallops.  ABDOMEN: Soft, non-tender, non-distended. Bowel sounds present. No organomegaly or mass.  EXTREMITIES: No pedal edema, cyanosis, or clubbing.  NEUROLOGIC: Cranial nerves II through XII are intact. Muscle strength 5/5 in all extremities. Sensation intact. Gait not checked.  PSYCHIATRIC: The patient is alert and oriented x 3.  SKIN: No obvious rash, lesion, or ulcer.   DATA REVIEW:   CBC  Recent Labs Lab 03/03/17 0449  WBC 8.1  HGB 13.4  HCT 37.1  PLT 156    Chemistries   Recent Labs Lab 02/25/17 2100  03/04/17 0329  NA 139  < > 142  K 3.5  < > 3.2*  CL 104  < > 113*  CO2 23  < > 22  GLUCOSE 223*  < > 98  BUN 25*  < > 42*  CREATININE 1.32*  < > 1.14*  CALCIUM 9.8  < > 8.4*  MG  --   --  1.8  AST 20  --   --   ALT 30  --   --   ALKPHOS 112  --   --   BILITOT 1.4*  --   --   < > = values in this interval not displayed.  Cardiac Enzymes  Recent Labs Lab 02/25/17 2100  TROPONINI <0.03    Microbiology Results  Results for orders placed or performed during the hospital encounter of 07/27/15  Surgical pcr screen     Status: None   Collection Time: 08/01/15 11:42 PM  Result Value Ref Range Status   MRSA, PCR NEGATIVE NEGATIVE Final   Staphylococcus aureus NEGATIVE NEGATIVE Final    Comment:        The Xpert SA Assay (FDA approved for NASAL specimens in patients over 35 years of age), is one component of a comprehensive surveillance program.  Test performance has been validated by [facility] for patients greater than or equal to 10 year old. It is not intended to diagnose infection nor to guide or monitor treatment.     RADIOLOGY:  US Abdomen Complete  Result Date: 03/03/2017 CLINICAL DATA:  Abdominal pain, acute renal injury. Previous cholecystectomy EXAM: ABDOMEN ULTRASOUND COMPLETE COMPARISON:  Abdominal ultrasound dated January 22, 2017 FINDINGS:  Gallbladder: The gallbladder is surgically absent. Common bile duct: Diameter: 4.7 mm Liver: The hepatic echotexture is mildly increased. There is no focal mass nor ductal dilation. The surface contour of the liver is smooth where visualized. IVC: No abnormality visualized. Pancreas: Visualized portion unremarkable. Spleen: Size and appearance within normal limits. Right Kidney: Length: 10.8 cm. The renal cortical echotexture  remains lower than that of the liver. There is no hydronephrosis. Left Kidney: Length: 11.0 cm. The renal cortical echotexture is similar to that on the right. There is no hydronephrosis. There is a midpole cortical cyst measuring 3.9 cm in diameter. . Abdominal aorta: The proximal aorta measures 3.4 cm AP x 3.9 cm transversely. The mid and distal aorta exhibit normal tapering caliber. Other findings: There is no ascites. IMPRESSION: There is no hydronephrosis nor other acute renal abnormality observed. Mildly increased hepatic echotexture is compatible with fatty infiltrative change. This is stable. Previous cholecystectomy. Mild dilation of the proximal abdominal aorta to 3.9 cm in the transverse plane. Recommend followup by ultrasound in 2 years. This recommendation follows ACR consensus guidelines: White Paper of the ACR Incidental Findings Committee II on Vascular Findings. J Am Coll Radiol 2013; 10:789-794. Electronically Signed   By: David  Martinique M.D.   On: 03/03/2017 09:10    Management plans discussed with the patient, family and they are in agreement.  CODE STATUS:     Code Status Orders        Start     Ordered   03/02/17 2130  Full code  Continuous     03/02/17 2130    Code Status History    Date Active Date Inactive Code Status Order ID Comments User Context   08/02/2015  1:53 PM 08/05/2015  8:14 PM Full Code [id number]  Consuella Lose, MD Inpatient   07/27/2015  9:26 PM 08/02/2015  1:53 PM DNR [id number]  Bethena Roys, MD Inpatient    Advance  Directive Documentation     Most Recent Value  Type of Advance Directive  Healthcare Power of [place], Living will  Pre-existing out of facility DNR order (yellow form or pink MOST form)  -  "MOST" Form in Place?  -      TOTAL TIME TAKING CARE OF THIS PATIENT: 35 minutes.    Loletha Grayer M.D on 03/04/2017 at 1:20 PM  Between 7am to 6pm - Pager - [phone]  After 6pm go to [email or web address] - Proofreader  [employer] [facility]  [phone]  CC: Primary care physician; Carmon Ginsberg, [place]

## 2017-03-04 NOTE — Care Management (Signed)
Physical therapy evaluation completed. Recommending home with home health and physical. Martha [name] requested hospital bed.  Discussed home health agencies. [facility]. Will update Martha [name], [facility] representative, Discussed blister packs for her medications. Telephone call to [name]. They charge $1.00 a pack and $2.00 to deliver. Martha [name] declines this service at this time. Will continue to get prescriptions filled at [facility] in [place]. Discharge to home today per Dr. Maryfrances Bunnell RN MSN CCM Care Management [phone]

## 2017-03-04 NOTE — Telephone Encounter (Signed)
Pt is being discharged from [facility] for acute kidney injury.  I have scheduled a hospital follow up appointment/MW

## 2017-03-04 NOTE — Progress Notes (Signed)
New referral for Palliative to follow at home received from [facility]. Plan is for discharge home today. Referral made aware. Thank you. Flo Shanks RN, BSN, [place], hospital Liaison [phone] c

## 2017-03-04 NOTE — Evaluation (Signed)
Physical Therapy Evaluation Patient Details Name: Martha [name] MRN: [id number] DOB: [birth year] Today's Date: 03/04/2017   History of Present Illness  Martha [name] is a 71 y.o. female with a known history of glioblastoma presents with nausea and weakness. The patient states that she has not been feeling well. She's not been eating very much. She has a history of gout. She's been having nausea vomiting going on for the last few weeks. Difficult to walk with some right leg pain and also some right knee pain. She's been having some migraines with some auras. In the ER, she was found to have a creatinine of 3.01 and a sodium of 130 and hospitalists were contacted for evaluation. She is now admitted for AKI, abdominal distention, and hypokalemia.   Clinical Impression  Pt admitted with above diagnosis. Pt currently with functional limitations due to the deficits listed below (see PT Problem List).  Pt reports feeling more unsteady and deconditioned compared to her baseline. She has a history of falls and presents with balance deficits in narrow and single leg stance. She ambulates partially around RN station with rolling walker. VSS with ambulation and pt denies DOE. She reports feeling unsteady with gait. No external assist required and pt able to stabilize on rolling walker. She would benefit from [facility] PT at discharge. Pt reported to therapist that she doesn't have to go upstairs for a bed but MD and care manager report that her bedroom is upstairs. She many need a hospital bed for safety at home. Pt will benefit from PT services to address deficits in strength, balance, and mobility in order to return to full function at home.     Follow Up Recommendations Home health PT    Equipment Recommendations  None recommended by PT;Other (comment) (Encouraged pt to use her )    Recommendations for Other Services       Precautions / Restrictions Precautions Precautions: Fall Restrictions Weight  Bearing Restrictions: No      Mobility  Bed Mobility Overal bed mobility: Independent             General bed mobility comments: Good speed and sequencing with bed mobility  Transfers Overall transfer level: Modified independent Equipment used: Rolling walker (2 wheeled)             General transfer comment: Pt demonstrates fair speed and stability with sit to stand transfers. UE support on rolling walker once in standing  Ambulation/Gait Ambulation/Gait assistance: Min guard Ambulation Distance (Feet): 120 Feet Assistive device: Rolling walker (2 wheeled) Gait Pattern/deviations: Decreased step length - right;Decreased step length - left Gait velocity: Decreased Gait velocity interpretation: <1.8 ft/sec, indicative of risk for recurrent falls General Gait Details: Pt ambulates partially around RN station with rolling walker. VSS with ambulation and pt denies DOE. She reports feeling deconditioned and mildly unsteady. No external assist required and pt able to stabilize on rolling walker  Stairs            Wheelchair Mobility    Modified Rankin (Stroke Patients Only)       Balance Overall balance assessment: Needs assistance Sitting-balance support: No upper extremity supported Sitting balance-Leahy Scale: Good     Standing balance support: No upper extremity supported Standing balance-Leahy Scale: Fair Standing balance comment: Pt able to maintain wide and narrow stance balance without UE support. She does require increased time to place feet together and is mildly unsteady. Positive Rhomberg and single leg balance is approximately 1 second  Pertinent Vitals/Pain Pain Assessment: 0-10 Pain Score: 2  Pain Location: Right knee Pain Descriptors / Indicators: Aching Pain Intervention(s): Monitored during session    Home Living Family/patient expects to be discharged to:: Private residence Living Arrangements:  Alone Available Help at Discharge: Friend(s) Type of Home: House Home Access: Stairs to enter Entrance Stairs-Rails: Right Entrance Stairs-Number of Steps: 1 Home Layout: Multi-level;Able to live on main level with bedroom/bathroom Home Equipment: Kasandra Knudsen - single point;Walker - 2 wheels;Bedside commode;Crutches (no shower chair, no grab bars, no shower seat)      Prior Function Level of Independence: Needs assistance   Gait / Transfers Assistance Needed: Ambulates with single point cane. 3 falls in the last 12 months  ADL's / Homemaking Assistance Needed: Independent with ADLs, assist from friends with IADLs        Hand Dominance   Dominant Hand: Right    Extremity/Trunk Assessment   Upper Extremity Assessment Upper Extremity Assessment: Overall WFL for tasks assessed    Lower Extremity Assessment Lower Extremity Assessment: Overall WFL for tasks assessed       Communication   Communication: No difficulties  Cognition Arousal/Alertness: Awake/alert Behavior During Therapy: WFL for tasks assessed/performed Overall Cognitive Status: Within Functional Limits for tasks assessed                                        General Comments      Exercises     Assessment/Plan    PT Assessment Patient needs continued PT services  PT Problem List Decreased strength;Decreased activity tolerance;Decreased balance       PT Treatment Interventions DME instruction;Gait training;Stair training;Therapeutic activities;Functional mobility training;Therapeutic exercise;Balance training;Neuromuscular re-education;Patient/family education    PT Goals (Current goals can be found in the Care Plan section)  Acute Rehab PT Goals Patient Stated Goal: Return to prior function at home PT Goal Formulation: With patient Time For Goal Achievement: 03/18/17 Potential to Achieve Goals: Good    Frequency Min 2X/week   Barriers to discharge        Co-evaluation                AM-PAC PT "6 Clicks" Daily Activity  Outcome Measure Difficulty turning over in bed (including adjusting bedclothes, sheets and blankets)?: None Difficulty moving from lying on back to sitting on the side of the bed? : None Difficulty sitting down on and standing up from a chair with arms (e.g., wheelchair, bedside commode, etc,.)?: None Help needed moving to and from a bed to chair (including a wheelchair)?: None Help needed walking in hospital room?: None Help needed climbing 3-5 steps with a railing? : A Little 6 Click Score: 23    End of Session Equipment Utilized During Treatment: Gait belt Activity Tolerance: Patient tolerated treatment well Patient left: in bed;with call bell/phone within reach;with bed alarm set;Other (comment) (Palliative care NP in room with patient)   PT Visit Diagnosis: Unsteadiness on feet (R26.81);History of falling (Z91.81);Muscle weakness (generalized) (M62.81)    Time: 7858-8502 PT Time Calculation (min) (ACUTE ONLY): 16 min   Charges:   PT Evaluation $PT Eval Low Complexity: 1 Procedure     PT G Codes:   PT G-Codes **NOT FOR INPATIENT CLASS** Functional Assessment Tool Used: AM-PAC 6 Clicks Basic Mobility Functional Limitation: Mobility: Walking and moving around Mobility: Walking and Moving Around Current Status (D7412): At least 1 percent but less than 20 percent  impaired, limited or restricted Mobility: Walking and Moving Around Goal Status [phone]): At least 1 percent but less than 20 percent impaired, limited or restricted    Phillips Grout PT, DPT    Martha [name] 03/04/2017, 12:50 PM

## 2017-03-04 NOTE — Care Management (Signed)
Martha [name] has thyroid cancer and has had cervical spinal surgery in the past which requires her upper body to be positioned in ways not feasible with a normal bed. The head of the bed must be elevated at least 30 degess or more or she has extreme pain and breathing difficulty. Shelbie Ammons RN MSN CCM Care Management [phone]

## 2017-03-08 ENCOUNTER — Emergency Department: Payer: Medicare Other

## 2017-03-08 ENCOUNTER — Other Ambulatory Visit: Payer: Self-pay

## 2017-03-08 ENCOUNTER — Encounter: Payer: Self-pay | Admitting: Emergency Medicine

## 2017-03-08 ENCOUNTER — Inpatient Hospital Stay: Payer: Medicare Other

## 2017-03-08 ENCOUNTER — Inpatient Hospital Stay: Payer: Medicare Other | Attending: Internal Medicine | Admitting: Internal Medicine

## 2017-03-08 ENCOUNTER — Emergency Department
Admission: EM | Admit: 2017-03-08 | Discharge: 2017-03-08 | Disposition: A | Discharge status: Home / Self Care | Payer: Medicare Other | Attending: Student in an Organized Health Care Education/Training Program | Admitting: Student in an Organized Health Care Education/Training Program

## 2017-03-08 ENCOUNTER — Encounter: Payer: Self-pay | Admitting: Family Medicine

## 2017-03-08 VITALS — BP 95/68 | HR 106 | Temp 97.6°F | Resp 20

## 2017-03-08 DIAGNOSIS — C712 Malignant neoplasm of temporal lobe: Secondary | ICD-10-CM | POA: Insufficient documentation

## 2017-03-08 DIAGNOSIS — E860 Dehydration: Secondary | ICD-10-CM

## 2017-03-08 DIAGNOSIS — R140 Abdominal distension (gaseous): Secondary | ICD-10-CM | POA: Diagnosis not present

## 2017-03-08 DIAGNOSIS — R911 Solitary pulmonary nodule: Secondary | ICD-10-CM | POA: Insufficient documentation

## 2017-03-08 DIAGNOSIS — E785 Hyperlipidemia, unspecified: Secondary | ICD-10-CM | POA: Diagnosis not present

## 2017-03-08 DIAGNOSIS — Z85850 Personal history of malignant neoplasm of thyroid: Secondary | ICD-10-CM | POA: Diagnosis not present

## 2017-03-08 DIAGNOSIS — E039 Hypothyroidism, unspecified: Secondary | ICD-10-CM | POA: Diagnosis not present

## 2017-03-08 DIAGNOSIS — I129 Hypertensive chronic kidney disease with stage 1 through stage 4 chronic kidney disease, or unspecified chronic kidney disease: Secondary | ICD-10-CM | POA: Diagnosis not present

## 2017-03-08 DIAGNOSIS — C719 Malignant neoplasm of brain, unspecified: Secondary | ICD-10-CM

## 2017-03-08 DIAGNOSIS — E119 Type 2 diabetes mellitus without complications: Secondary | ICD-10-CM | POA: Insufficient documentation

## 2017-03-08 DIAGNOSIS — Z923 Personal history of irradiation: Secondary | ICD-10-CM | POA: Insufficient documentation

## 2017-03-08 DIAGNOSIS — Z79899 Other long term (current) drug therapy: Secondary | ICD-10-CM | POA: Insufficient documentation

## 2017-03-08 DIAGNOSIS — Z85841 Personal history of malignant neoplasm of brain: Secondary | ICD-10-CM | POA: Insufficient documentation

## 2017-03-08 DIAGNOSIS — T380X5A Adverse effect of glucocorticoids and synthetic analogues, initial encounter: Secondary | ICD-10-CM

## 2017-03-08 DIAGNOSIS — I10 Essential (primary) hypertension: Secondary | ICD-10-CM | POA: Diagnosis not present

## 2017-03-08 DIAGNOSIS — N183 Chronic kidney disease, stage 3 (moderate): Secondary | ICD-10-CM | POA: Insufficient documentation

## 2017-03-08 DIAGNOSIS — G40909 Epilepsy, unspecified, not intractable, without status epilepticus: Secondary | ICD-10-CM | POA: Insufficient documentation

## 2017-03-08 DIAGNOSIS — R109 Unspecified abdominal pain: Secondary | ICD-10-CM | POA: Diagnosis not present

## 2017-03-08 DIAGNOSIS — R1110 Vomiting, unspecified: Secondary | ICD-10-CM | POA: Diagnosis not present

## 2017-03-08 DIAGNOSIS — E273 Drug-induced adrenocortical insufficiency: Secondary | ICD-10-CM

## 2017-03-08 DIAGNOSIS — N179 Acute kidney failure, unspecified: Secondary | ICD-10-CM | POA: Insufficient documentation

## 2017-03-08 DIAGNOSIS — R112 Nausea with vomiting, unspecified: Secondary | ICD-10-CM | POA: Insufficient documentation

## 2017-03-08 DIAGNOSIS — K635 Polyp of colon: Secondary | ICD-10-CM | POA: Insufficient documentation

## 2017-03-08 DIAGNOSIS — F329 Major depressive disorder, single episode, unspecified: Secondary | ICD-10-CM | POA: Diagnosis not present

## 2017-03-08 DIAGNOSIS — R42 Dizziness and giddiness: Secondary | ICD-10-CM | POA: Diagnosis not present

## 2017-03-08 DIAGNOSIS — Z1239 Encounter for other screening for malignant neoplasm of breast: Secondary | ICD-10-CM

## 2017-03-08 DIAGNOSIS — R110 Nausea: Secondary | ICD-10-CM | POA: Diagnosis not present

## 2017-03-08 DIAGNOSIS — Z01419 Encounter for gynecological examination (general) (routine) without abnormal findings: Secondary | ICD-10-CM

## 2017-03-08 DIAGNOSIS — Z87891 Personal history of nicotine dependence: Secondary | ICD-10-CM | POA: Diagnosis not present

## 2017-03-08 LAB — URINALYSIS, COMPLETE (UACMP) WITH MICROSCOPIC
BACTERIA UA: NONE SEEN
BILIRUBIN URINE: NEGATIVE
GLUCOSE, UA: NEGATIVE mg/dL
Ketones, ur: NEGATIVE mg/dL
LEUKOCYTES UA: NEGATIVE
NITRITE: NEGATIVE
PH: 5 (ref 5.0–8.0)
Protein, ur: NEGATIVE mg/dL
RBC / HPF: NONE SEEN RBC/hpf (ref 0–5)
SPECIFIC GRAVITY, URINE: 1.014 (ref 1.005–1.030)
SQUAMOUS EPITHELIAL / LPF: NONE SEEN

## 2017-03-08 LAB — COMPREHENSIVE METABOLIC PANEL
ALK PHOS: 112 U/L (ref 38–126)
ALT: 53 U/L (ref 14–54)
ALT: 55 U/L — AB (ref 14–54)
AST: 49 U/L — AB (ref 15–41)
AST: 44 U/L — ABNORMAL HIGH (ref 15–41)
Albumin: 3.8 g/dL (ref 3.5–5.0)
Albumin: 3.8 g/dL (ref 3.5–5.0)
Alkaline Phosphatase: 116 U/L (ref 38–126)
Anion gap: 10 (ref 5–15)
Anion gap: 9 (ref 5–15)
BILIRUBIN TOTAL: 1 mg/dL (ref 0.3–1.2)
BILIRUBIN TOTAL: 0.9 mg/dL (ref 0.3–1.2)
BUN: 17 mg/dL (ref 6–20)
BUN: 18 mg/dL (ref 6–20)
CALCIUM: 8.6 mg/dL — AB (ref 8.9–10.3)
CALCIUM: 8.7 mg/dL — AB (ref 8.9–10.3)
CO2: 24 mmol/L (ref 22–32)
CO2: 23 mmol/L (ref 22–32)
CREATININE: 1.1 mg/dL — AB (ref 0.44–1.00)
CREATININE: 1.07 mg/dL — AB (ref 0.44–1.00)
Chloride: 104 mmol/L (ref 101–111)
Chloride: 104 mmol/L (ref 101–111)
GFR calc Af Amer: 58 mL/min — ABNORMAL LOW (ref >60)
GFR, EST AFRICAN AMERICAN: 60 mL/min — AB (ref >60)
GFR, EST NON AFRICAN AMERICAN: 50 mL/min — AB (ref >60)
GFR, EST NON AFRICAN AMERICAN: 51 mL/min — AB (ref >60)
Glucose, Bld: 157 mg/dL — ABNORMAL HIGH (ref 65–99)
Glucose, Bld: 174 mg/dL — ABNORMAL HIGH (ref 65–99)
Potassium: 3.7 mmol/L (ref 3.5–5.1)
Potassium: 3.6 mmol/L (ref 3.5–5.1)
Sodium: 138 mmol/L (ref 135–145)
Sodium: 136 mmol/L (ref 135–145)
TOTAL PROTEIN: 6.6 g/dL (ref 6.5–8.1)
TOTAL PROTEIN: 6.4 g/dL — AB (ref 6.5–8.1)

## 2017-03-08 LAB — CBC WITH DIFFERENTIAL/PLATELET
BASOS ABS: 0.1 10*3/uL (ref 0–0.1)
Basophils Absolute: 0.1 10*3/uL (ref 0–0.1)
Basophils Relative: 1 %
Basophils Relative: 1 %
Eosinophils Absolute: 0 10*3/uL (ref 0–0.7)
Eosinophils Absolute: 0 10*3/uL (ref 0–0.7)
Eosinophils Relative: 1 %
Eosinophils Relative: 1 %
HCT: 38 % (ref 35.0–47.0)
HEMATOCRIT: 39.7 % (ref 35.0–47.0)
HEMOGLOBIN: 13.6 g/dL (ref 12.0–16.0)
Hemoglobin: 13.9 g/dL (ref 12.0–16.0)
LYMPHS ABS: 1.7 10*3/uL (ref 1.0–3.6)
LYMPHS ABS: 1.7 10*3/uL (ref 1.0–3.6)
LYMPHS PCT: 21 %
LYMPHS PCT: 20 %
MCH: 31.1 pg (ref 26.0–34.0)
MCH: 31.1 pg (ref 26.0–34.0)
MCHC: 35 g/dL (ref 32.0–36.0)
MCHC: 35.7 g/dL (ref 32.0–36.0)
MCV: 89 fL (ref 80.0–100.0)
MCV: 87.3 fL (ref 80.0–100.0)
MONO ABS: 0.8 10*3/uL (ref 0.2–0.9)
Monocytes Absolute: 0.8 10*3/uL (ref 0.2–0.9)
Monocytes Relative: 10 %
Monocytes Relative: 9 %
NEUTROS ABS: 5.7 10*3/uL (ref 1.4–6.5)
NEUTROS PCT: 69 %
Neutro Abs: 5.9 10*3/uL (ref 1.4–6.5)
Neutrophils Relative %: 67 %
Platelets: 237 10*3/uL (ref 150–440)
Platelets: 269 10*3/uL (ref 150–440)
RBC: 4.46 MIL/uL (ref 3.80–5.20)
RBC: 4.36 MIL/uL (ref 3.80–5.20)
RDW: 12.6 % (ref 11.5–14.5)
RDW: 12.3 % (ref 11.5–14.5)
WBC: 8.4 10*3/uL (ref 3.6–11.0)
WBC: 8.5 10*3/uL (ref 3.6–11.0)

## 2017-03-08 LAB — CK: CK TOTAL: 183 U/L (ref 38–234)

## 2017-03-08 LAB — TROPONIN I: Troponin I: <0.03 ng/mL (ref <0.03)

## 2017-03-08 LAB — LIPASE, BLOOD: LIPASE: 83 U/L — AB (ref 11–51)

## 2017-03-08 MED ORDER — METOCLOPRAMIDE HCL 10 MG PO TABS: 10.0000 mg | ORAL_TABLET | Freq: Four times a day (QID) | ORAL | 0 refills | Status: DC | PRN

## 2017-03-08 MED ORDER — IOPAMIDOL (ISOVUE-300) INJECTION 61%
100.0000 mL | Freq: Once | INTRAVENOUS | Status: AC | PRN
  Administered 2017-03-08: 100 mL via INTRAVENOUS

## 2017-03-08 MED ORDER — SODIUM CHLORIDE 0.9 % IV BOLUS (SEPSIS)
1000.0000 mL | Freq: Once | INTRAVENOUS | Status: AC
  Administered 2017-03-08: 1000 mL via INTRAVENOUS

## 2017-03-08 MED ORDER — DEXAMETHASONE 4 MG PO TABS: 4.0000 mg | ORAL_TABLET | Freq: Two times a day (BID) | ORAL | 3 refills | Status: DC

## 2017-03-08 MED ORDER — PROMETHAZINE HCL 25 MG/ML IJ SOLN: 12.5000 mg | Freq: Four times a day (QID) | INTRAMUSCULAR | Status: DC | PRN

## 2017-03-08 MED ORDER — DEXAMETHASONE SODIUM PHOSPHATE 10 MG/ML IJ SOLN
10.0000 mg | Freq: Once | INTRAMUSCULAR | Status: AC
  Administered 2017-03-08: 10 mg via INTRAMUSCULAR
  Filled 2017-03-08: qty 1

## 2017-03-08 MED ORDER — DEXAMETHASONE SODIUM PHOSPHATE 10 MG/ML IJ SOLN: 10.0000 mg | Freq: Once | INTRAMUSCULAR | Status: DC

## 2017-03-08 NOTE — ED Notes (Signed)
Pt hypertensive and tachycardic; MD made aware, and instructed to continue with discharge.

## 2017-03-08 NOTE — Assessment & Plan Note (Addendum)
Status post resection followed by concurrent chemoradiation-currently on cycle # 8 of adjuvant therapy. Currently not on any active therapy- as her MRI May 24 stable disease no evidence of recurrence.  # Intractable nausea vomiting- question etiology. CT scan head noncontrast/abdomen pelvis with contrast in the emergency room this morning-negative for any acute process. Suspect adrenal insufficiency [? Prior extensively use of steroids] versus recurrence/progression of the brain tumor. Recommend dexamethasone 10 mg IM today. Also recommend dexamethasone 4 mg twice a day [prescription sent over].   # Abdominal discomfort-CT scan negative for any acute process. Question above.  # Acute renal failure- precarinal creatinine up to 3 on the recent admission; currently resolved.  # Given history of diabetes- on steroids; recommends close monitoring. In to inform us.  # History of thyroid cancer- on Synthroid. Recent TSH normal. Continue Synthroid.   # Palliative care consultation- I think is reasonable- if there is true progression/recurrence of disease. Await MRI.  # Ordered stat MRI of the brain; follow-up in 1-2 days post MRI.

## 2017-03-08 NOTE — ED Notes (Signed)
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

## 2017-03-08 NOTE — ED Notes (Signed)
Pt assisted to the bathroom by this RN. Pt given a ginger ale by this RN per her request. Pt refused any kind of crackers or food. MD made aware, states okay. Pt back to bed without incident. Pt is alert and oriented. Denies any further needs. Will continue to monitor for further patient needs.

## 2017-03-08 NOTE — ED Triage Notes (Signed)
Pt ems from home for nausea x 2 weeks. Pt with hx brain ca and has had nausea. Taking zofran. Recently been treated for uti.

## 2017-03-08 NOTE — Progress Notes (Signed)
Patient here for HP - follow-up . Pt states that she was evaluated in ED today for nausea/vomiting and abdominal distention. Pt c/o nausea at the present time-requested emesis bag. Pt unable to get out of w/c w/o assist. States that she "feels very weak."

## 2017-03-08 NOTE — Progress Notes (Signed)
[facility] OFFICE PROGRESS NOTE  Patient Care Team: Carmon Ginsberg, [place] as PCP - General (Family Medicine) [place], Tomasa Blase, RN as [place] Management  No matching staging information was found for the patient.   Oncology History   1. 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 (January, 2017; s/p resection Dr.Nundkumar; [place]) 2 starting radiation therapy and Temodar January of 2017 3.She has finished radiation therapy in April of 2017 and also finished Temodar for approximately 6 weeks 4.Patient started on maintenance Temodar from Jan 03, 2016; July 2017- MRI no recurrence; left hemisphere- whitish plaques ? 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    #Thyroid cancer s/p Thyroidectomy [s/p RAUI; 1967]     Glioblastoma determined by biopsy of brain ([place])   08/27/2015 Initial Diagnosis    Glioblastoma determined by biopsy of brain ([place])         INTERVAL HISTORY:  Martha [name] 71 y.o.  female pleasant patient above history of GBM right frontal status post resection; Adjuvant Temodar for 8 months- currently on surveillance is here for follow-up.  Patient interim was admitted to the hospital for acute renal failure nausea vomiting/abdominal discomfort. Symptoms/creatinine improved and was discharged home after IV hydration. MRI of the brain was not done because of acute renal failure.  Interestingly patient ended up in the emergency room again this morning with similar complaints- given IV fluids; CT scan of the abdomen- negative for any acute process.  Patient overall continues to feel poorly. Complains of intermittent headaches. Complains of abdominal discomfort. Extreme nausea. Nausea. No diarrhea. Gait instability. No seizures. Not interested in going back to the hospital.   REVIEW OF SYSTEMS:  A complete 10 point review of system is  done which is negative except mentioned above/history of present illness.   PAST MEDICAL HISTORY :  Past Medical History:  Diagnosis Date  . Cancer [facility])    thyroid cancer treated with radioactive iodine  . Depression   . Diabetes mellitus without complication ([place])   . Epilepsy ([place])   . Glioblastoma determined by biopsy of brain ([place])   . Hyperlipidemia   . Hypertension   . Thyroid disease    h/o thyroid ca- 30 years ago  . Varicose vein     PAST SURGICAL HISTORY :   Past Surgical History:  Procedure Laterality Date  . APPLICATION OF CRANIAL NAVIGATION N/A 08/02/2015   Procedure: APPLICATION OF CRANIAL NAVIGATION;  Surgeon: Consuella Lose, MD;  Location: [facility];  Service: Neurosurgery;  Laterality: N/A;  APPLICATION OF CRANIAL NAVIGATION  . BILATERAL CARPAL TUNNEL RELEASE  1990,1991   left hand in 1990 right hand in '91  . CERVICAL SPINE SURGERY  05/1997   spinal fusion  . CHOLECYSTECTOMY  04/24/2008   status post laparoscopic Dr.Ely  . CRANIOTOMY Right 08/02/2015   Procedure: CRANIOTOMY TUMOR EXCISION;  Surgeon: Consuella Lose, MD;  Location: [facility] NEURO ORS;  Service: Neurosurgery;  Laterality: Right;  CRANIOTOMY TUMOR EXCISION  . radioactive iodine    . THYROIDECTOMY, PARTIAL  1964   due to cancerous tumor  . TOE SURGERY Left 1989    FAMILY HISTORY :   Family History  Problem Relation Age of Onset  . Arthritis Mother   . Hyperlipidemia Mother   . Hypertension Mother   . Heart disease Mother   . Diabetes Mother        type 2  .  Thyroid disease Mother   . Cancer Father        colon  . Alcohol abuse Brother   . Hyperlipidemia Brother   . Hypertension Brother   . Breast cancer Neg Hx   . Ovarian cancer Neg Hx     SOCIAL HISTORY:   Social History  Substance Use Topics  . Smoking status: Former Research scientist (life sciences)  . Smokeless tobacco: Never Used     Comment: quit smoking in 2007  . Alcohol use No    ALLERGIES:  is allergic to ampicillin; fish-derived  products; tomato; and penicillins.  MEDICATIONS:  Current Outpatient Prescriptions  Medication Sig Dispense Refill  . acetaminophen (TYLENOL) 500 MG tablet Take 500 mg by mouth every 6 (six) hours as needed for mild pain or moderate pain.    Marland Kitchen allopurinol (ZYLOPRIM) 100 MG tablet Take 2 tablets (200 mg total) by mouth daily. 60 tablet 0  . colchicine (COLCRYS) 0.6 MG tablet Take 1 tablet (0.6 mg total) by mouth daily. 90 tablet 3  . feeding supplement, ENSURE ENLIVE, (ENSURE ENLIVE) LIQD Take 237 mLs by mouth 3 (three) times daily between meals. 90 Bottle 0  . glipiZIDE (GLUCOTROL) 5 MG tablet TAKE 1 TABLET BY MOUTH TWICE A DAY 30 MINUTES BEFORE A MEAL 60 tablet 3  . levETIRAcetam (KEPPRA) 500 MG tablet Take 1 tablet (500 mg total) by mouth 2 (two) times daily. Reported on 12/11/2015    . levothyroxine (SYNTHROID, LEVOTHROID) 100 MCG tablet TAKE 1 TABLET (100 MCG TOTAL) BY MOUTH DAILY. 30 tablet 3  . lisinopril (PRINIVIL,ZESTRIL) 20 MG tablet Take 20 mg by mouth daily.  3  . potassium chloride 20 MEQ TBCR Take 20 mEq by mouth daily. 10 tablet 0  . pravastatin (PRAVACHOL) 40 MG tablet Take 40 mg by mouth daily.  1  . promethazine (PHENERGAN) 25 MG tablet Take 1 tablet (25 mg total) by mouth every 6 (six) hours as needed for nausea or vomiting. 30 tablet 0  . dexamethasone (DECADRON) 4 MG tablet Take 1 tablet (4 mg total) by mouth 2 (two) times daily. With food; start tomorrow. 60 tablet 3  . metoCLOPramide (REGLAN) 10 MG tablet Take 1 tablet (10 mg total) by mouth every 6 (six) hours as needed for nausea. (Patient not taking: Reported on 03/08/2017) 12 tablet 0  . nortriptyline (PAMELOR) 10 MG capsule TAKE ONE TO THREE CAPSULES AT BEDTIME TO HELP WITH SLEEP AND NERVE PAIN (Patient not taking: Reported on 03/08/2017) 30 capsule 0   No current facility-administered medications for this visit.     PHYSICAL EXAMINATION: ECOG PERFORMANCE STATUS: 2 - Symptomatic, <50% confined to bed  BP 95/68    Pulse (!) 106   Temp 97.6 F (36.4 C) (Tympanic)   Resp 20   There were no vitals filed for this visit.  GENERAL: Well-nourished well-developed; Alert, Feels weak. She is in a wheelchair. She is accompanied by her friend.  EYES: no pallor or icterus OROPHARYNX: no thrush or ulceration; good dentition  NECK: supple, no masses felt LYMPH:  no palpable lymphadenopathy in the cervical, axillary or inguinal regions LUNGS: clear to auscultation and  No wheeze or crackles HEART/CVS: regular rate & rhythm and no murmurs; No lower extremity edema ABDOMEN:abdomen soft, non-tender and normal bowel sounds Musculoskeletal:no cyanosis of digits and no clubbing  PSYCH: alert & oriented x 3 with fluent speech NEURO: no focal motor/sensory deficits; No tremors. SKIN:  no rashes or significant lesions  LABORATORY DATA:  I have reviewed the  data as listed    Component Value Date/Time   NA 136 03/08/2017 1437   NA 148 (H) 11/14/2015 1623   NA 142 11/14/2014 1506   K 3.6 03/08/2017 1437   K 3.8 11/14/2014 1506   CL 104 03/08/2017 1437   CL 110 11/14/2014 1506   CO2 23 03/08/2017 1437   CO2 23 11/14/2014 1506   GLUCOSE 174 (H) 03/08/2017 1437   GLUCOSE 104 (H) 11/14/2014 1506   BUN 18 03/08/2017 1437   BUN 13 11/14/2015 1623   BUN 31 (H) 11/14/2014 1506   CREATININE 1.07 (H) 03/08/2017 1437   CREATININE 1.08 (H) 11/14/2014 1506   CALCIUM 8.7 (L) 03/08/2017 1437   CALCIUM 9.5 11/14/2014 1506   PROT 6.4 (L) 03/08/2017 1437   ALBUMIN 3.8 03/08/2017 1437   ALBUMIN 3.9 11/14/2015 1623   AST 44 (H) 03/08/2017 1437   ALT 55 (H) 03/08/2017 1437   ALKPHOS 112 03/08/2017 1437   BILITOT 0.9 03/08/2017 1437   GFRNONAA 51 (L) 03/08/2017 1437   GFRNONAA 53 (L) 11/14/2014 1506   GFRAA 60 (L) 03/08/2017 1437   GFRAA >60 11/14/2014 1506    No results found for: SPEP, UPEP  Lab Results  Component Value Date   WBC 8.5 03/08/2017   NEUTROABS 5.9 03/08/2017   HGB 13.6 03/08/2017   HCT 38.0  03/08/2017   MCV 87.3 03/08/2017   PLT 269 03/08/2017      Chemistry      Component Value Date/Time   NA 136 03/08/2017 1437   NA 148 (H) 11/14/2015 1623   NA 142 11/14/2014 1506   K 3.6 03/08/2017 1437   K 3.8 11/14/2014 1506   CL 104 03/08/2017 1437   CL 110 11/14/2014 1506   CO2 23 03/08/2017 1437   CO2 23 11/14/2014 1506   BUN 18 03/08/2017 1437   BUN 13 11/14/2015 1623   BUN 31 (H) 11/14/2014 1506   CREATININE 1.07 (H) 03/08/2017 1437   CREATININE 1.08 (H) 11/14/2014 1506      Component Value Date/Time   CALCIUM 8.7 (L) 03/08/2017 1437   CALCIUM 9.5 11/14/2014 1506   ALKPHOS 112 03/08/2017 1437   AST 44 (H) 03/08/2017 1437   ALT 55 (H) 03/08/2017 1437   BILITOT 0.9 03/08/2017 1437     IMPRESSION: 1. Unchanged appearance of right temporal lobe resection site and mild surrounding white matter T2 signal abnormality. 2. Unchanged scattered foci of enhancement and mineralization predominantly in the left cerebral hemisphere, indeterminate but may reflect treated thyroid metastases or prior infection.   Electronically Signed   By: Logan Bores M.D.   On: 09/04/2016 12:45  RADIOGRAPHIC STUDIES: I have personally reviewed the radiological images as listed and agreed with the findings in the report. Ct Head Wo Contrast  Result Date: 03/08/2017 CLINICAL DATA:  Intractable nausea.  History of glioblastoma EXAM: CT HEAD WITHOUT CONTRAST TECHNIQUE: Contiguous axial images were obtained from the base of the skull through the vertex without intravenous contrast. COMPARISON:  CT head 02/25/2017 FINDINGS: Brain: Postop resection of tumor in the right posterior temporal lobe appears stable. Encephalomalacia and dilatation of the right temporal horn unchanged. No mass-effect or evidence of recurrent tumor. MRI with contrast more sensitive for tumor recurrence. Negative for hydrocephalus. Generalized atrophy. Multiple calcifications in the left cerebral hemisphere stable from prior  studies. Vascular: Negative for hyperdense vessel Skull: Right temporal craniotomy.  No acute skull abnormality Sinuses/Orbits: Negative Other: None IMPRESSION: Stable CT head.  No acute abnormality.  Postop resection of right temporal lobe tumor without evidence of recurrent tumor or hydrocephalus. Electronically Signed   By: Franchot Gallo M.D.   On: 03/08/2017 10:33   Ct Abdomen Pelvis W Contrast  Result Date: 03/08/2017 CLINICAL DATA:  Abdominal pain and distention. Intractable nausea and vomiting for 2 weeks. EXAM: CT ABDOMEN AND PELVIS WITH CONTRAST TECHNIQUE: Multidetector CT imaging of the abdomen and pelvis was performed using the standard protocol following bolus administration of intravenous contrast. CONTRAST:  187mL ISOVUE-300 IOPAMIDOL (ISOVUE-300) INJECTION 61% COMPARISON:  11/09/2005 FINDINGS: Lower Chest: 7 mm pulmonary nodule seen right lower lobe. Hepatobiliary: No hepatic masses identified. Prior cholecystectomy. No evidence of biliary dilatation. Pancreas:  No mass or inflammatory changes. Spleen: Within normal limits in size and appearance. Adrenals/Urinary Tract: No masses identified. Simple renal cysts noted bilaterally. No evidence of hydronephrosis. Unremarkable unopacified urinary bladder. Stomach/Bowel: Colonic diverticulosis, without radiographic evidence of diverticulitis. Enhancing 1 cm polypoid density along the lateral wall of the ascending colon on image 43/2. Normal appendix visualized. Vascular/Lymphatic: No pathologically enlarged lymph nodes. No abdominal aortic aneurysm. Aortic atherosclerosis. Reproductive: Small uterine fibroids again seen, largest measuring approximately 4 cm. Adnexal regions are unremarkable. Other:  None. Musculoskeletal:  No suspicious bone lesions identified. IMPRESSION: No acute findings within the abdomen or pelvis. Uterine fibroids measuring up to 4 cm. Colonic diverticulosis, without radiographic evidence of diverticulitis. 1 cm enhancing polyp  suspected in the ascending colon. Suggest colonoscopy further evaluation. 7 mm indeterminate right lower lobe pulmonary nodule. Non-contrast chest CT at 6-12 months is recommended. If the nodule is stable at time of repeat CT, then future CT at 18-24 months (from today's scan) is considered optional for low-risk patients, but is recommended for high-risk patients. This recommendation follows the consensus statement: Guidelines for Management of Incidental Pulmonary Nodules Detected on CT Images: From the Fleischner Society 2017; Radiology 2017; 284:228-243. Electronically Signed   By: Earle Gell M.D.   On: 03/08/2017 11:10   Dg Chest Portable 1 View  Result Date: 03/08/2017 CLINICAL DATA:  Nausea for 2 weeks. EXAM: PORTABLE CHEST 1 VIEW COMPARISON:  Single-view of the chest 08/02/2015. PA and lateral chest 07/28/2015. FINDINGS: The lungs are clear. Heart size is normal. No pneumothorax or pleural fluid. Aortic atherosclerosis is noted. IMPRESSION: No acute disease. Atherosclerosis. Electronically Signed   By: Inge Rise M.D.   On: 03/08/2017 08:34     ASSESSMENT & PLAN:  Glioblastoma determined by biopsy of brain [facility]) Status post resection followed by concurrent chemoradiation-currently on cycle # 8 of adjuvant therapy. Currently not on any active therapy- as her MRI May 24 stable disease no evidence of recurrence.  # Intractable nausea vomiting- question etiology. CT scan head noncontrast/abdomen pelvis with contrast in the emergency room this morning-negative for any acute process. Suspect adrenal insufficiency [? Prior extensively use of steroids] versus recurrence/progression of the brain tumor. Recommend dexamethasone 10 mg IM today. Also recommend dexamethasone 4 mg twice a day [prescription sent over].   # Abdominal discomfort-CT scan negative for any acute process. Question above.  # Acute renal failure- precarinal creatinine up to 3 on the recent admission; currently resolved.  #  Given history of diabetes- on steroids; recommends close monitoring. In to inform us.  # History of thyroid cancer- on Synthroid. Recent TSH normal. Continue Synthroid.   # Palliative care consultation- I think is reasonable- if there is true progression/recurrence of disease. Await MRI.  # Ordered stat MRI of the brain; follow-up in 1-2 days post MRI.  Orders Placed This Encounter  Procedures  . MR BRAIN W WO CONTRAST    Standing Status:   Future    Standing Expiration Date:   05/08/2018    Order Specific Question:   Reason for Exam (SYMPTOM  OR DIAGNOSIS REQUIRED)    Answer:   GBM    Order Specific Question:   Preferred imaging location?    Answer:   [facility] (table limit-300lbs)    Order Specific Question:   Does the patient have a pacemaker or implanted devices?    Answer:   Yes    Order Specific Question:   What is the patient's sedation requirement?    Answer:   No Sedation    Order Specific Question:   Call Results- Best Contact Number?    Answer:   [phone] hold patient      Cammie Sickle, MD 03/08/2017 5:14 PM

## 2017-03-08 NOTE — ED Notes (Signed)
Patient up to bedside commode with assistance.  Patient is in no obvious distress at this time.  Reports relief from nausea.

## 2017-03-08 NOTE — ED Provider Notes (Signed)
[facility] Emergency Department Provider Note    None    (approximate)  I have reviewed the triage vital signs and the nursing notes.   HISTORY  Chief Complaint Nausea    HPI Martha [name] is a 71 y.o. female with history of glioblastoma presenting with several weeks of persistent nausea and intermittent abdominal distention. Denies any new numbness or tingling. She is on chronic steroids. Was recently admitted for dehydration and was started on antibiotics for "kidney infection". Denies any diarrhea. Denies any chest pain. No fevers. Does feel weak. She was at home alone.   Past Medical History:  Diagnosis Date  . Cancer [facility])    thyroid cancer treated with radioactive iodine  . Depression   . Diabetes mellitus without complication ([place])   . Epilepsy ([place])   . Glioblastoma determined by biopsy of brain ([place])   . Hyperlipidemia   . Hypertension   . Thyroid disease    h/o thyroid ca- 30 years ago  . Varicose vein    Family History  Problem Relation Age of Onset  . Arthritis Mother   . Hyperlipidemia Mother   . Hypertension Mother   . Heart disease Mother   . Diabetes Mother        type 2  . Thyroid disease Mother   . Cancer Father        colon  . Alcohol abuse Brother   . Hyperlipidemia Brother   . Hypertension Brother   . Breast cancer Neg Hx   . Ovarian cancer Neg Hx    Past Surgical History:  Procedure Laterality Date  . APPLICATION OF CRANIAL NAVIGATION N/A 08/02/2015   Procedure: APPLICATION OF CRANIAL NAVIGATION;  Surgeon: Consuella Lose, MD;  Location: [place] [facility];  Service: Neurosurgery;  Laterality: N/A;  APPLICATION OF CRANIAL NAVIGATION  . BILATERAL CARPAL TUNNEL RELEASE  1990,1991   left hand in 1990 right hand in '91  . CERVICAL SPINE SURGERY  05/1997   spinal fusion  . CHOLECYSTECTOMY  04/24/2008   status post laparoscopic Dr.Ely  . CRANIOTOMY Right 08/02/2015   Procedure: CRANIOTOMY TUMOR EXCISION;   Surgeon: Consuella Lose, MD;  Location: [place] [facility] OR[facility];  Service: Neurosurgery;  Laterality: Right;  CRANIOTOMY TUMOR EXCISION  . radioactive iodine    . THYROIDECTOMY, PARTIAL  1964   due to cancerous tumor  . TOE SURGERY Left 1989   Patient Active Problem List   Diagnosis Date Noted  . Glioblastoma ([place])   . Poor fluid intake   . Goals of care, counseling/discussion   . Palliative care encounter   . Acute kidney injury ([place]) 03/02/2017  . Right hip pain 10/14/2016  . Thyroid cancer ([place]) 10/14/2016  . CKD (chronic kidney disease) stage 3, GFR 30-59 ml/min 07/06/2016  . Gout 04/06/2016  . Glioblastoma determined by biopsy of brain ([place]) 08/27/2015  . S/P craniotomy 08/02/2015  . HLD (hyperlipidemia) 12/24/2014  . Adult hypothyroidism 12/24/2014  . Headache, migraine 12/24/2014  . Essential hypertension 05/29/2014  . Type 2 diabetes mellitus ([place]) 05/29/2014  . Seizure disorder ([place]) 05/29/2014  . Adiposity 05/29/2014      Prior to Admission medications   Medication Sig Start Date End Date Taking? Authorizing Provider  acetaminophen (TYLENOL) 500 MG tablet Take 500 mg by mouth every 6 (six) hours as needed for mild pain or moderate pain.   Yes [provider]  allopurinol (ZYLOPRIM) 100 MG tablet Take 2 tablets (200 mg total) by mouth daily. 03/05/17  Yes Wieting, Richard, MD  colchicine (COLCRYS) 0.6 MG tablet Take 1 tablet (0.6 mg total) by mouth daily. 02/25/17  Yes Chauvin, Herbie [place], PA  feeding supplement, ENSURE ENLIVE, (ENSURE ENLIVE) LIQD Take 237 mLs by mouth 3 (three) times daily between meals. 03/04/17  Yes Wieting, Richard, MD  glipiZIDE (GLUCOTROL) 5 MG tablet TAKE 1 TABLET BY MOUTH TWICE A DAY 30 MINUTES BEFORE A MEAL 02/15/17  Yes Carmon Ginsberg, PA  levETIRAcetam (KEPPRA) 500 MG tablet Take 1 tablet (500 mg total) by mouth 2 (two) times daily. Reported on 12/11/2015 12/11/15  Yes Choksi, Delorise Shiner, MD  levothyroxine (SYNTHROID, LEVOTHROID) 100 MCG tablet TAKE 1  TABLET (100 MCG TOTAL) BY MOUTH DAILY. 02/15/17  Yes Cammie Sickle, MD  lisinopril (PRINIVIL,ZESTRIL) 20 MG tablet Take 20 mg by mouth daily. 02/15/17  Yes [provider]  potassium chloride 20 MEQ TBCR Take 20 mEq by mouth daily. 03/04/17  Yes Wieting, Richard, MD  pravastatin (PRAVACHOL) 40 MG tablet Take 40 mg by mouth daily. 02/18/17  Yes [provider]  promethazine (PHENERGAN) 25 MG tablet Take 1 tablet (25 mg total) by mouth every 6 (six) hours as needed for nausea or vomiting. 03/04/17  Yes Leslye Peer, Richard, MD  metoCLOPramide (REGLAN) 10 MG tablet Take 1 tablet (10 mg total) by mouth every 6 (six) hours as needed for nausea. 03/08/17 03/08/18  Merlyn Lot, MD  nortriptyline (PAMELOR) 10 MG capsule TAKE ONE TO THREE CAPSULES AT BEDTIME TO HELP WITH SLEEP AND NERVE PAIN Patient not taking: Reported on 03/08/2017 10/08/16   Carmon Ginsberg, PA    Allergies Ampicillin; Fish-derived products; Tomato; and Penicillins    Social History Social History  Substance Use Topics  . Smoking status: Former Research scientist (life sciences)  . Smokeless tobacco: Never Used     Comment: quit smoking in 2007  . Alcohol use No    Review of Systems Patient denies headaches, rhinorrhea, blurry vision, numbness, shortness of breath, chest pain, edema, cough, abdominal pain, nausea, vomiting, diarrhea, dysuria, fevers, rashes or hallucinations unless otherwise stated above in HPI. ____________________________________________   PHYSICAL EXAM:  VITAL SIGNS: Vitals:   03/08/17 1230 03/08/17 1300  BP: (!) 155/117 (!) 158/109  Pulse: 98 100  Resp:    Temp:      Constitutional: Alert and oriented. in no acute distress. Eyes: Conjunctivae are normal.  Head: Atraumatic. Nose: No congestion/rhinnorhea. Mouth/Throat: Mucous membranes are dry.   Neck: No stridor. Painless ROM.  Cardiovascular: Normal rate, regular rhythm. Grossly normal heart sounds.  Good peripheral circulation. Respiratory: Normal  respiratory effort.  No retractions. Lungs CTAB. Gastrointestinal: Soft and nontender. No distention. No abdominal bruits. No CVA tenderness. Musculoskeletal: No lower extremity tenderness nor edema.  No joint effusions. Neurologic:  Normal speech and language. No gross focal neurologic deficits are appreciated. No facial droop Skin:  Skin is warm, dry and intact. No rash noted. Psychiatric: Mood and affect are normal. Speech and behavior are normal.  ____________________________________   LABS (all labs ordered are listed, but only abnormal results are displayed)  Results for orders placed or performed during the hospital encounter of 03/08/17 (from the past 24 hour(s))  CBC with Differential/Platelet     Status: None   Collection Time: 03/08/17  8:21 AM  Result Value Ref Range   WBC 8.4 3.6 - 11.0 K/uL   RBC 4.46 3.80 - 5.20 MIL/uL   Hemoglobin 13.9 12.0 - 16.0 g/dL   HCT 39.7 35.0 - 47.0 %   MCV 89.0 80.0 - 100.0  fL   MCH 31.1 26.0 - 34.0 pg   MCHC 35.0 32.0 - 36.0 g/dL   RDW 12.6 11.5 - 14.5 %   Platelets 237 150 - 440 K/uL   Neutrophils Relative % 67 %   Neutro Abs 5.7 1.4 - 6.5 K/uL   Lymphocytes Relative 21 %   Lymphs Abs 1.7 1.0 - 3.6 K/uL   Monocytes Relative 10 %   Monocytes Absolute 0.8 0.2 - 0.9 K/uL   Eosinophils Relative 1 %   Eosinophils Absolute 0.0 0 - 0.7 K/uL   Basophils Relative 1 %   Basophils Absolute 0.1 0 - 0.1 K/uL  Comprehensive metabolic panel     Status: Abnormal   Collection Time: 03/08/17  8:21 AM  Result Value Ref Range   Sodium 138 135 - 145 mmol/L   Potassium 3.7 3.5 - 5.1 mmol/L   Chloride 104 101 - 111 mmol/L   CO2 24 22 - 32 mmol/L   Glucose, Bld 157 (H) 65 - 99 mg/dL   BUN 17 6 - 20 mg/dL   Creatinine, Ser 1.10 (H) 0.44 - 1.00 mg/dL   Calcium 8.6 (L) 8.9 - 10.3 mg/dL   Total Protein 6.6 6.5 - 8.1 g/dL   Albumin 3.8 3.5 - 5.0 g/dL   AST 49 (H) 15 - 41 U/L   ALT 53 14 - 54 U/L   Alkaline Phosphatase 116 38 - 126 U/L   Total  Bilirubin 1.0 0.3 - 1.2 mg/dL   GFR calc non Af Amer 50 (L) >60 mL/min   GFR calc Af Amer 58 (L) >60 mL/min   Anion gap 10 5 - 15  Lipase, blood     Status: Abnormal   Collection Time: 03/08/17  8:21 AM  Result Value Ref Range   Lipase 83 (H) 11 - 51 U/L  Urinalysis, Complete w Microscopic     Status: Abnormal   Collection Time: 03/08/17  8:21 AM  Result Value Ref Range   Color, Urine STRAW (A) YELLOW   APPearance CLEAR (A) CLEAR   Specific Gravity, Urine 1.014 1.005 - 1.030   pH 5.0 5.0 - 8.0   Glucose, UA NEGATIVE NEGATIVE mg/dL   Hgb urine dipstick MODERATE (A) NEGATIVE   Bilirubin Urine NEGATIVE NEGATIVE   Ketones, ur NEGATIVE NEGATIVE mg/dL   Protein, ur NEGATIVE NEGATIVE mg/dL   Nitrite NEGATIVE NEGATIVE   Leukocytes, UA NEGATIVE NEGATIVE   RBC / HPF NONE SEEN 0 - 5 RBC/hpf   WBC, UA 0-5 0 - 5 WBC/hpf   Bacteria, UA NONE SEEN NONE SEEN   Squamous Epithelial / LPF NONE SEEN NONE SEEN  Troponin I     Status: None   Collection Time: 03/08/17  8:21 AM  Result Value Ref Range   Troponin I <0.03 <0.03 ng/mL   ____________________________________________  EKG My review and personal interpretation at Time: 8:19   Indication: nausea  Rate: 100  Rhythm: sinus Axis: normal Other: non specific st changes, no STEMI, normal intervals ____________________________________________  RADIOLOGY  I personally reviewed all radiographic images ordered to evaluate for the above acute complaints and reviewed radiology reports and findings.  These findings were personally discussed with the patient.  Please see medical record for radiology report.  ____________________________________________   PROCEDURES  Procedure(s) performed:  Procedures    Critical Care performed: no ____________________________________________   INITIAL IMPRESSION / ASSESSMENT AND PLAN / ED COURSE  Pertinent labs & imaging results that were available during my care of the patient were  reviewed by me and  considered in my medical decision making (see chart for details).  DDX: dehydration, aki, ich, edema, pna, uti, sepsis  TONETTA NAPOLES is a 71 y.o. who presents to the ED with nausea and dehydration as described above. Blood work sent to evaluate for above differential is reassuring. She is otherwise afebrile in no acute distress. CT imaging ordered to evaluate for evidence of worsening edema given her history of glioblastoma. CT imaging is roughly at baseline. She has no new focal neurologic deficits to indicate need for emergent MRI. CT abdomen ordered to evaluate for evidence of obstructive process, pancreatitis, cholecystitis or cholelithiasis. CT imaging is reassuring. Patient will be given IV fluids for symptomatically management.  Clinical Course as of Mar 08 1320  Mon Mar 08, 2017  1029 HCT: 39.7 [PR]  1237 Patient rechecked. Abdominal exam soft benign. Patient requesting something to eat and drink.  [PR]  1313 Patient was able to tolerate PO.  At this point do feel patient appropriate for discharge home.  Have discussed with the patient and available family all diagnostics and treatments performed thus far and all questions were answered to the best of my ability. The patient demonstrates understanding and agreement with plan.    [PR]    Clinical Course User Index [PR] Merlyn Lot, MD     ____________________________________________   FINAL CLINICAL IMPRESSION(S) / ED DIAGNOSES  Final diagnoses:  Nausea without vomiting  Dehydration  4    NEW MEDICATIONS STARTED DURING THIS VISIT:  New Prescriptions   METOCLOPRAMIDE (REGLAN) 10 MG TABLET    Take 1 tablet (10 mg total) by mouth every 6 (six) hours as needed for nausea.     Note:  This document was prepared using Dragon voice recognition software and may include unintentional dictation errors.    Merlyn Lot, MD 03/08/17 1322

## 2017-03-08 NOTE — ED Notes (Addendum)
This RN to bedside to introduce self to patient and family. Pt ambulated to bathroom by Vicente Males, RN. Pt denies any further needs. Pt c/o abdominal pain 3/01 at the umbilicus. VSS and WNL. Pt denies any needs. Will continue to monitor for further patient needs.

## 2017-03-08 NOTE — ED Notes (Signed)
Pt assisted to the bathroom by Vicente Males, RN.

## 2017-03-08 NOTE — ED Notes (Signed)
Pt ambulated to toilet in room with no difficulty or distress.

## 2017-03-08 NOTE — ED Notes (Signed)
Patient up to bedside commode.

## 2017-03-09 ENCOUNTER — Other Ambulatory Visit: Payer: Self-pay

## 2017-03-09 ENCOUNTER — Ambulatory Visit
Admission: RE | Admit: 2017-03-09 | Discharge: 2017-03-09 | Disposition: A | Discharge status: Home / Self Care | Payer: Medicare Other | Source: Ambulatory Visit | Attending: Internal Medicine | Admitting: Internal Medicine

## 2017-03-09 DIAGNOSIS — C719 Malignant neoplasm of brain, unspecified: Secondary | ICD-10-CM | POA: Diagnosis not present

## 2017-03-09 DIAGNOSIS — R9402 Abnormal brain scan: Secondary | ICD-10-CM | POA: Insufficient documentation

## 2017-03-09 DIAGNOSIS — R51 Headache: Secondary | ICD-10-CM | POA: Diagnosis not present

## 2017-03-09 IMAGING — MR MR HEAD WO/W CM
9 of 13 series · 34 of 48 positions shown · IV contrast (multihance)
Comparison: Brain MRI 12/12/2015 and earlier.

CLINICAL DATA: 69-year-old female with right temporal lobe
glioblastoma resected in July 2015. Restaging. Personal history
also of treated thyroid cancer. Multiple calcified lesions in the
left hemisphere, some demonstrating enhancement on prior MRIs.
Subsequent encounter.

EXAM:
MRI HEAD WITHOUT AND WITH CONTRAST
TECHNIQUE: Multiplanar, multiecho pulse sequences of the brain and surrounding
structures were obtained without and with intravenous contrast.
CONTRAST:  14mL MULTIHANCE GADOBENATE DIMEGLUMINE 529 MG/ML IV SOLN

[Series 4: DWI · axial · 4.0mm · 0.94mm/px · z∈[-84,+84]mm · 3 of 43 slices shown (1 of 4)]
[im 1/43]
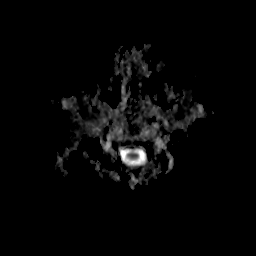
[im 22/43]
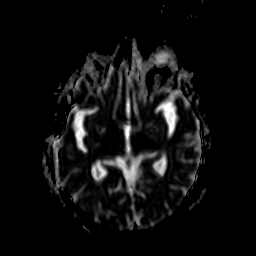
[im 43/43]
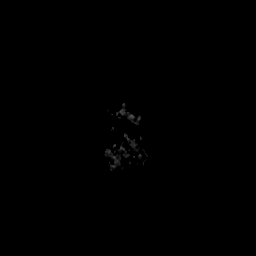

[Series 6: DWI · coronal · 5.0mm · 1.80mm/px · 3 of 41 slices shown (2 of 4)]
[im 1/41]
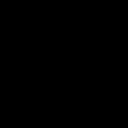
[im 21/41]
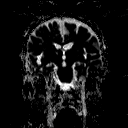
[im 41/41]
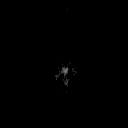

[Series 7: DWI · axial · 4.0mm · 0.94mm/px · z∈[-84,+84]mm · 8 of 86 slices shown (3 of 4)]
[im 1/86]
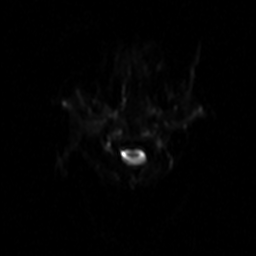
[im 13/86]
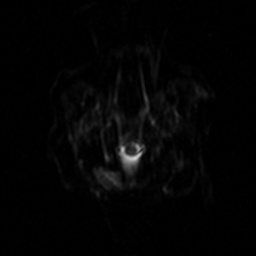
[im 25/86]
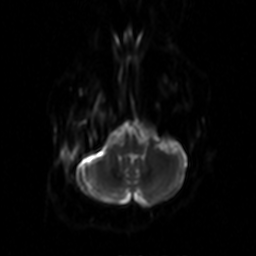
[im 37/86]
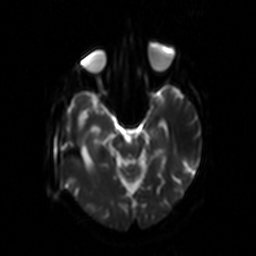
[im 49/86]
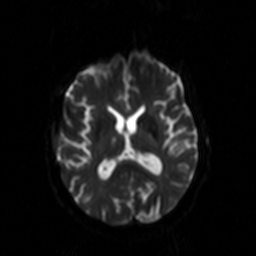
[im 61/86]
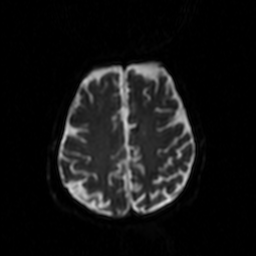
[im 73/86]
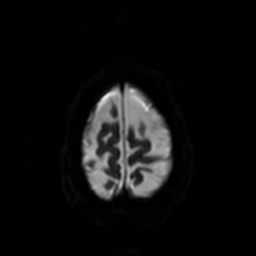
[im 86/86]
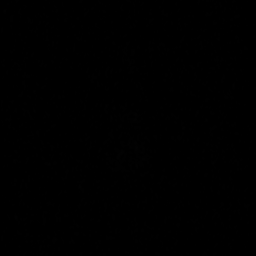

[Series 8: DWI · coronal · 5.0mm · 1.80mm/px · 3 of 39 slices shown (4 of 4)]
[im 1/39]
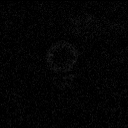
[im 20/39]
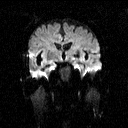
[im 39/39]
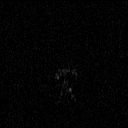

[Series 9: T2 · axial · 5.0mm · 0.45mm/px · z∈[-87,+95]mm · 3 of 29 slices shown]
[im 1/29]
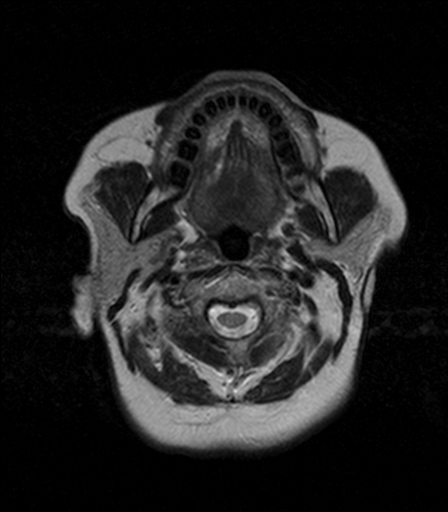
[im 15/29]
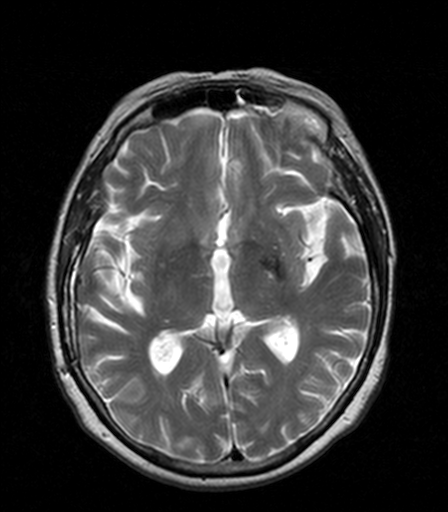
[im 29/29]
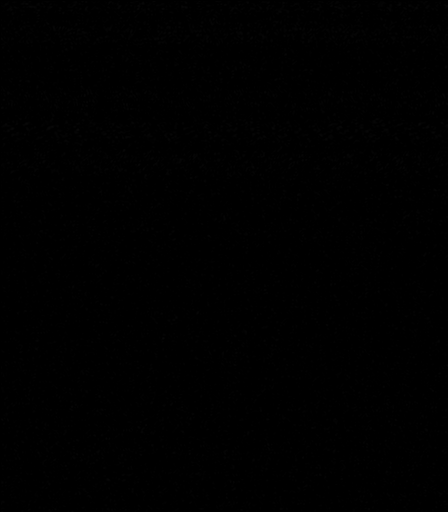

[Series 10: FLAIR · axial · 5.0mm · 0.90mm/px · z∈[-87,+95]mm · 2 of 28 slices shown]
[im 1/28]
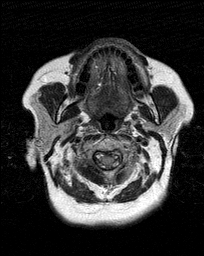
[im 28/28]
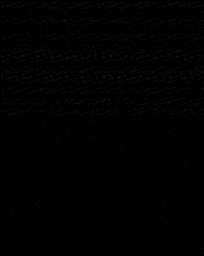

[Series 16: T1 post-contrast · axial · 3.0mm · 0.45mm/px · z∈[-91,+98]mm · 6 of 64 slices shown (1 of 3)]
[im 1/64]
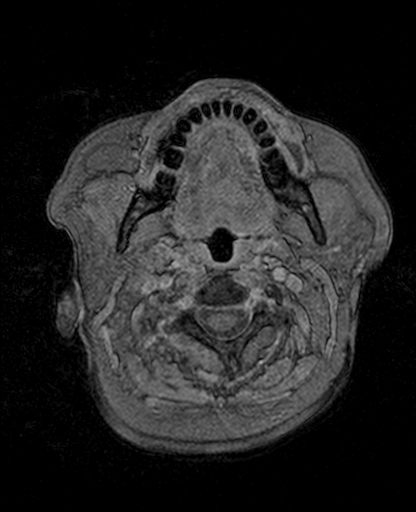
[im 13/64]
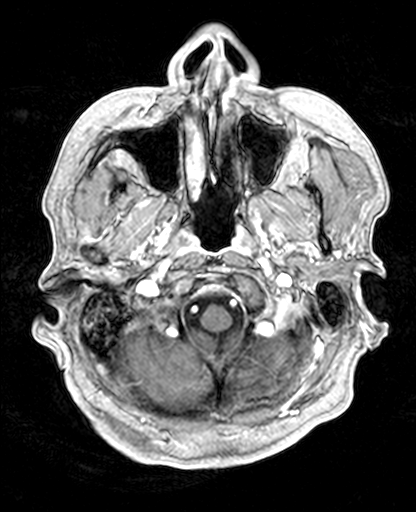
[im 26/64]
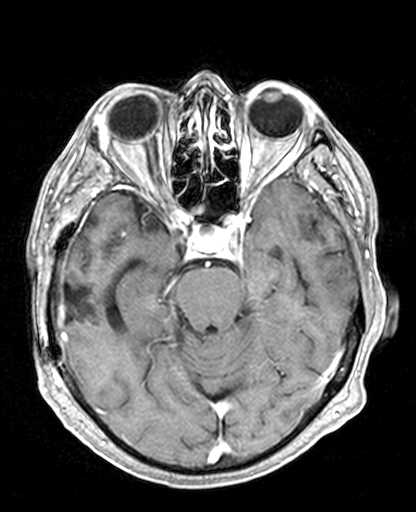
[im 38/64]
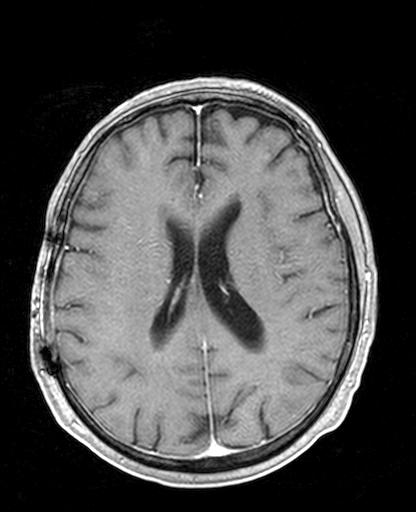
[im 51/64]
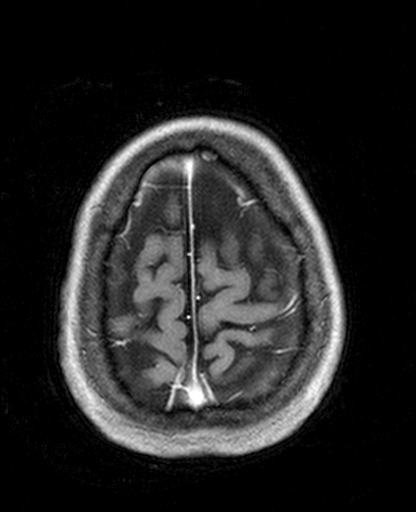
[im 64/64]
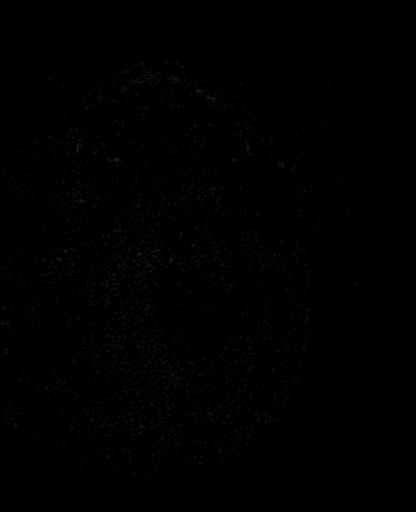

[Series 17: T1 post-contrast · coronal · 5.0mm · 0.45mm/px · 3 of 31 slices shown (2 of 3)]
[im 1/31]
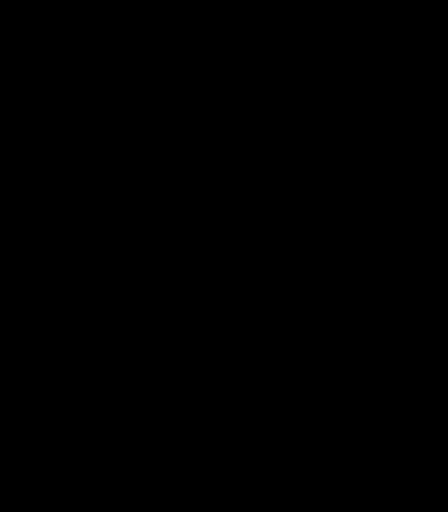
[im 16/31]
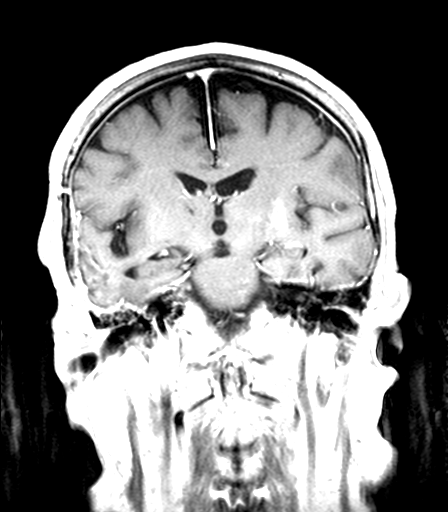
[im 31/31]
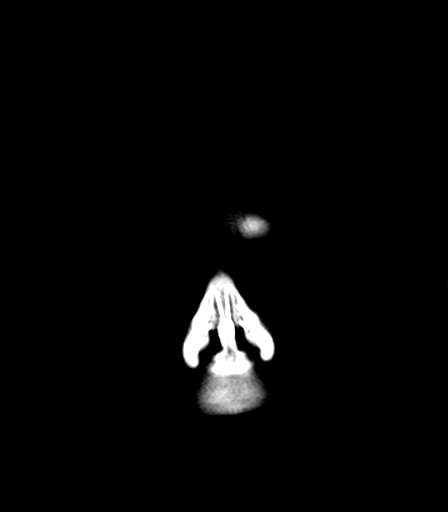

[Series 18: T1 post-contrast · sagittal · 5.0mm · 0.45mm/px · 3 of 31 slices shown (3 of 3)]
[im 1/31]
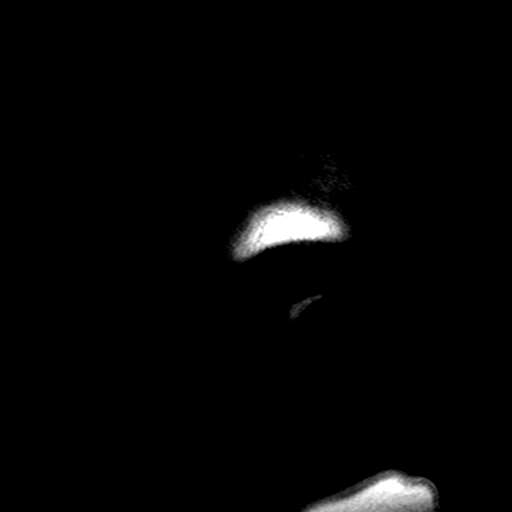
[im 16/31]
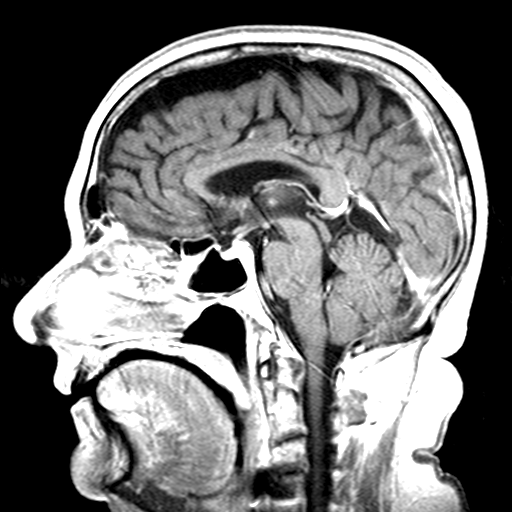
[im 31/31]
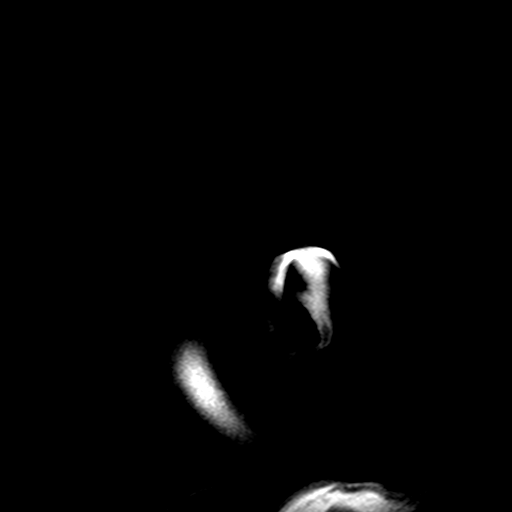

[34 of 48 positions shown; findings below may reference images not displayed]

FINDINGS: Sequelae of right frontotemporal craniotomy. Cystic right lateral
temporal lobe resection cavity with hemosiderin re- demonstrated
with only minimal curvilinear enhancement along the inferior margin
which is favored to be vascular in nature (series 16, image 25). No
suspicious enhancement. However, nearby there is increased T2
hyperintensity in the white matter of the right superior temporal
gyrus (series 9, image 14 and series 15, image 14), but no
associated gyral enlargement or regional mass effect. Somewhat
contiguous T2 hyperintensity in the mesial right temporal lobe
appears stable (series 9, image 13). Same findings are noted on
FLAIR. No diffusion changes in the region.

In the contralateral left hemisphere small areas of patchy
enhancement are re- demonstrated, superimposed on a low level of
intrinsic T1 hyperintensity (especially at the left basal ganglia.
The findings appear stable [REDACTED] as annotated in the left
hemisphere on series 16. Most of these areas demonstrate decreased
T2 * signal compatible with he mineralization or hemosiderin, and
these were heavily calcified on prior CT. No associated left
hemisphere edema or mass effect.

Stable gray and white matter signal elsewhere. Major intracranial
vascular flow voids are stable. No restricted diffusion or evidence
of acute infarction. No intracranial mass effect. No
ventriculomegaly. No acute intracranial hemorrhage identified.
Chronic micro hemorrhage in the left pons unchanged. Negative
pituitary and cervicomedullary junction. Stable visualized cervical
spine. Normal bone marrow signal.

Right mastoid effusion is mildly increased. Negative nasopharynx.
Stable paranasal sinuses. No acute orbit or scalp soft tissue
findings.
IMPRESSION: 1. Right temporal lobe GBM resection with stable appearance of the
resection cavity and no suspicious enhancement. However, there has
been mild increased temporal lobe white matter T2 signal abnormality
[REDACTED] (see series 9, image 14) but this is not mass-like and I
favor is post treatment related. Attention is directed on followup.
2. Interval stable appearance of on usual densely calcified and
mildly enhancing lesions in the left hemisphere. Given the personal
history of treated thyroid carcinoma, I favor these are treated
thyroid metastases.
3. No new intracranial abnormality.

## 2017-03-09 MED ORDER — GADOBENATE DIMEGLUMINE 529 MG/ML IV SOLN
15.0000 mL | Freq: Once | INTRAVENOUS | Status: AC | PRN
  Administered 2017-03-09: 14 mL via INTRAVENOUS

## 2017-03-09 NOTE — Patient Outreach (Signed)
[place] [facility] [place]) Care Management  03/09/2017  SHONTELL PROSSER [birth year] [id number]     EMMI-GENERAL DISCHARGE RED ON EMMI ALERT Day # 1 Date: 03/07/17 Red Alert Reason: "Transportation to follow up? No"   Outreach attempt # 1 to patient. No answer at present and RN CM unable to leave voicemail message.        Plan: RN CM will make outreach attempt to patient within one business day if no return call from patient.   Enzo Montgomery, RN,BSN,CCM [place] Management Telephonic Care Management Coordinator Direct Phone: [phone] Toll Free: [phone] Fax: [phone]

## 2017-03-09 NOTE — Telephone Encounter (Signed)
Transition Care Management Follow-up Telephone Call    Date discharged? 03/04/17  How have you been since you were released from the hospital? Doing better, no more migraines, nausea or vomiting. Still having abdominal cramps. Pt is drinking ginger ale, water and protein shakes (for breakfast). Appetite has not been regained fully.   Any patient concerns? The longevity of this issue and the cramping.   Items Reviewed:  Medications reviewed: Yes  Allergies reviewed: Yes  Dietary changes reviewed: N/A  Referrals reviewed: Yes, Dr. Rogue Bussing   Functional Questionnaire:  Independent - I Dependent - D    Activities of Daily Living (ADLs):    Personal hygiene - I Dressing - I Eating - I Maintaining continence - I Transferring - I   Independent Activities of Daily Living (iADLs): Basic communication skills - I Transportation - D Meal preparation - I Shopping - I Housework - Needs assistance Managing medications - I  Managing personal finances - I   Confirmed importance and date/time of follow-up visits scheduled YES  Provider Appointment booked with PCP 03/11/17 @ 11:00 AM  Confirmed with patient if condition begins to worsen call PCP or go to the ER.  Patient was given the office number and encouraged to call back with question or concerns: YES

## 2017-03-10 ENCOUNTER — Other Ambulatory Visit: Payer: Self-pay | Admitting: Internal Medicine

## 2017-03-10 ENCOUNTER — Other Ambulatory Visit: Payer: Self-pay

## 2017-03-10 NOTE — Progress Notes (Signed)
Noted  

## 2017-03-10 NOTE — Patient Outreach (Signed)
[place] [facility]) Care Management  03/10/2017  Martha [name] [birth year] [id number]    EMMI-GENERAL DISCHARGE RED ON EMMI ALERT Day # 1 Date: 03/07/17 Red Alert Reason: "Transportation to follow up? No"     Outreach attempt #2 to patient. No answer at present and unable to leave message.      Plan: RN CM will send unsuccessful outreach letter to patient and close case if no response within 10 business days.  Enzo Montgomery, RN,BSN,CCM [place] Management Telephonic Care Management Coordinator Direct Phone: [phone] Toll Free: [phone] Fax: [phone]

## 2017-03-10 NOTE — Progress Notes (Signed)
FYI- Will discuss the MRI at the tumor conference.

## 2017-03-11 ENCOUNTER — Inpatient Hospital Stay: Payer: Medicare Other | Attending: Internal Medicine | Admitting: Internal Medicine

## 2017-03-11 ENCOUNTER — Inpatient Hospital Stay: Payer: PRIVATE HEALTH INSURANCE | Admitting: Family Medicine

## 2017-03-11 ENCOUNTER — Encounter: Payer: Self-pay | Admitting: Family Medicine

## 2017-03-11 VITALS — BP 139/92 | HR 101 | Temp 97.5°F | Resp 20 | Ht 61.0 in | Wt 157.4 lb

## 2017-03-11 DIAGNOSIS — E119 Type 2 diabetes mellitus without complications: Secondary | ICD-10-CM | POA: Insufficient documentation

## 2017-03-11 DIAGNOSIS — G40909 Epilepsy, unspecified, not intractable, without status epilepticus: Secondary | ICD-10-CM | POA: Diagnosis not present

## 2017-03-11 DIAGNOSIS — Z79899 Other long term (current) drug therapy: Secondary | ICD-10-CM | POA: Insufficient documentation

## 2017-03-11 DIAGNOSIS — E785 Hyperlipidemia, unspecified: Secondary | ICD-10-CM | POA: Insufficient documentation

## 2017-03-11 DIAGNOSIS — C712 Malignant neoplasm of temporal lobe: Secondary | ICD-10-CM | POA: Diagnosis not present

## 2017-03-11 DIAGNOSIS — Z7952 Long term (current) use of systemic steroids: Secondary | ICD-10-CM | POA: Insufficient documentation

## 2017-03-11 DIAGNOSIS — Z923 Personal history of irradiation: Secondary | ICD-10-CM | POA: Diagnosis not present

## 2017-03-11 DIAGNOSIS — N179 Acute kidney failure, unspecified: Secondary | ICD-10-CM | POA: Diagnosis not present

## 2017-03-11 DIAGNOSIS — Z85850 Personal history of malignant neoplasm of thyroid: Secondary | ICD-10-CM | POA: Insufficient documentation

## 2017-03-11 DIAGNOSIS — F329 Major depressive disorder, single episode, unspecified: Secondary | ICD-10-CM | POA: Insufficient documentation

## 2017-03-11 DIAGNOSIS — E2740 Unspecified adrenocortical insufficiency: Secondary | ICD-10-CM | POA: Diagnosis not present

## 2017-03-11 DIAGNOSIS — R5381 Other malaise: Secondary | ICD-10-CM | POA: Diagnosis not present

## 2017-03-11 DIAGNOSIS — Z87891 Personal history of nicotine dependence: Secondary | ICD-10-CM | POA: Insufficient documentation

## 2017-03-11 DIAGNOSIS — Z7984 Long term (current) use of oral hypoglycemic drugs: Secondary | ICD-10-CM | POA: Insufficient documentation

## 2017-03-11 DIAGNOSIS — I10 Essential (primary) hypertension: Secondary | ICD-10-CM | POA: Diagnosis not present

## 2017-03-11 DIAGNOSIS — C719 Malignant neoplasm of brain, unspecified: Secondary | ICD-10-CM

## 2017-03-11 NOTE — Progress Notes (Signed)
[place] OFFICE PROGRESS NOTE  Patient Care Team: Carmon Ginsberg, [place] as PCP - General (Family Medicine) [place], Tomasa Blase, RN as [place] Management  No matching staging information was found for the patient.   Oncology History   1. 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 (January, 2017; s/p resection Dr.Nundkumar; [place]) 2 starting radiation therapy and Temodar January of 2017 3.She has finished radiation therapy in April of 2017 and also finished Temodar for approximately 6 weeks 4.Patient started on maintenance Temodar from Jan 03, 2016; July 2017- MRI no recurrence; left hemisphere- whitish plaques ? 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    #Thyroid cancer s/p Thyroidectomy [s/p RAUI; 1967]     Glioblastoma determined by biopsy of brain ([place])   08/27/2015 Initial Diagnosis    Glioblastoma determined by biopsy of brain ([place])         INTERVAL HISTORY:  Martha [name] 71 y.o.  female pleasant patient above history of GBM right frontal status post resection; Status post adjuvant Temodar 8 months.   Patient has been off Temodar for the last 2 months. Patient is here to review the results of her brain MRI.  Since the last visit after receiving steroids in the office; and also started on dexamethasone 4 mg twice a day- patient has noted significant improvement of her symptoms.  Her abdominal pain nausea vomiting has resolved. Her appetite is improving.   She was told to have improvement of headaches. She is walking herself. Accompanied by family friend.  REVIEW OF SYSTEMS:  A complete 10 point review of system is done which is negative except mentioned above/history of present illness.   PAST MEDICAL HISTORY :  Past Medical History:  Diagnosis Date  . Cancer [facility])    thyroid cancer treated with radioactive iodine  .  Depression   . Diabetes mellitus without complication ([place])   . Epilepsy ([place])   . Glioblastoma determined by biopsy of brain ([place])   . Hyperlipidemia   . Hypertension   . Thyroid disease    h/o thyroid ca- 30 years ago  . Varicose vein     PAST SURGICAL HISTORY :   Past Surgical History:  Procedure Laterality Date  . APPLICATION OF CRANIAL NAVIGATION N/A 08/02/2015   Procedure: APPLICATION OF CRANIAL NAVIGATION;  Surgeon: Consuella Lose, MD;  Location: [place] NEURO ORS;  Service: Neurosurgery;  Laterality: N/A;  APPLICATION OF CRANIAL NAVIGATION  . BILATERAL CARPAL TUNNEL RELEASE  1990,1991   left hand in 1990 right hand in '91  . CERVICAL SPINE SURGERY  05/1997   spinal fusion  . CHOLECYSTECTOMY  04/24/2008   status post laparoscopic Dr.Ely  . CRANIOTOMY Right 08/02/2015   Procedure: CRANIOTOMY TUMOR EXCISION;  Surgeon: Consuella Lose, MD;  Location: [place] NEURO ORS;  Service: Neurosurgery;  Laterality: Right;  CRANIOTOMY TUMOR EXCISION  . radioactive iodine    . THYROIDECTOMY, PARTIAL  1964   due to cancerous tumor  . TOE SURGERY Left 1989    FAMILY HISTORY :   Family History  Problem Relation Age of Onset  . Arthritis Mother   . Hyperlipidemia Mother   . Hypertension Mother   . Heart disease Mother   . Diabetes Mother        type 2  . Thyroid disease Mother   . Cancer Father        colon  . Alcohol abuse Brother   .  Hyperlipidemia Brother   . Hypertension Brother   . Breast cancer Neg Hx   . Ovarian cancer Neg Hx     SOCIAL HISTORY:   Social History  Substance Use Topics  . Smoking status: Former Research scientist (life sciences)  . Smokeless tobacco: Never Used     Comment: quit smoking in 2007  . Alcohol use No    ALLERGIES:  is allergic to ampicillin; fish-derived products; tomato; and penicillins.  MEDICATIONS:  Current Outpatient Prescriptions  Medication Sig Dispense Refill  . acetaminophen (TYLENOL) 500 MG tablet Take 500 mg by mouth every 6 (six) hours as needed for  mild pain or moderate pain.    Marland Kitchen allopurinol (ZYLOPRIM) 100 MG tablet Take 2 tablets (200 mg total) by mouth daily. 60 tablet 0  . colchicine (COLCRYS) 0.6 MG tablet Take 1 tablet (0.6 mg total) by mouth daily. 90 tablet 3  . dexamethasone (DECADRON) 4 MG tablet Take 1 tablet (4 mg total) by mouth 2 (two) times daily. With food; start tomorrow. 60 tablet 3  . feeding supplement, ENSURE ENLIVE, (ENSURE ENLIVE) LIQD Take 237 mLs by mouth 3 (three) times daily between meals. 90 Bottle 0  . glipiZIDE (GLUCOTROL) 5 MG tablet TAKE 1 TABLET BY MOUTH TWICE A DAY 30 MINUTES BEFORE A MEAL 60 tablet 3  . levETIRAcetam (KEPPRA) 500 MG tablet Take 1 tablet (500 mg total) by mouth 2 (two) times daily. Reported on 12/11/2015    . levothyroxine (SYNTHROID, LEVOTHROID) 100 MCG tablet TAKE 1 TABLET (100 MCG TOTAL) BY MOUTH DAILY. 30 tablet 3  . lisinopril (PRINIVIL,ZESTRIL) 20 MG tablet Take 20 mg by mouth daily.  3  . metoCLOPramide (REGLAN) 10 MG tablet Take 1 tablet (10 mg total) by mouth every 6 (six) hours as needed for nausea. 12 tablet 0  . nortriptyline (PAMELOR) 10 MG capsule TAKE ONE TO THREE CAPSULES AT BEDTIME TO HELP WITH SLEEP AND NERVE PAIN 30 capsule 0  . potassium chloride 20 MEQ TBCR Take 20 mEq by mouth daily. 10 tablet 0  . pravastatin (PRAVACHOL) 40 MG tablet Take 40 mg by mouth daily.  1  . promethazine (PHENERGAN) 25 MG tablet Take 1 tablet (25 mg total) by mouth every 6 (six) hours as needed for nausea or vomiting. 30 tablet 0   No current facility-administered medications for this visit.     PHYSICAL EXAMINATION: ECOG PERFORMANCE STATUS: 1 - Symptomatic but completely ambulatory  BP (!) 139/92   Pulse (!) 101   Temp (!) 97.5 F (36.4 C) (Tympanic)   Resp 20   Ht 5\' 1"  (1.549 m)   Wt 157 lb 6.4 oz (71.4 kg)   BMI 29.74 kg/m   Filed Weights   03/11/17 1028  Weight: 157 lb 6.4 oz (71.4 kg)    GENERAL: Well-nourished well-developed; Alert,She seems to be more energetic today. She  is walking herself She is accompanied by her friend.  EYES: no pallor or icterus OROPHARYNX: no thrush or ulceration; good dentition  NECK: supple, no masses felt LYMPH:  no palpable lymphadenopathy in the cervical, axillary or inguinal regions LUNGS: clear to auscultation and  No wheeze or crackles HEART/CVS: regular rate & rhythm and no murmurs; No lower extremity edema ABDOMEN:abdomen soft, non-tender and normal bowel sounds Musculoskeletal:no cyanosis of digits and no clubbing  PSYCH: alert & oriented x 3 with fluent speech NEURO: no focal motor/sensory deficits; No tremors. SKIN:  no rashes or significant lesions  LABORATORY DATA:  I have reviewed the data as listed  Component Value Date/Time   NA 136 03/08/2017 1437   NA 148 (H) 11/14/2015 1623   NA 142 11/14/2014 1506   K 3.6 03/08/2017 1437   K 3.8 11/14/2014 1506   CL 104 03/08/2017 1437   CL 110 11/14/2014 1506   CO2 23 03/08/2017 1437   CO2 23 11/14/2014 1506   GLUCOSE 174 (H) 03/08/2017 1437   GLUCOSE 104 (H) 11/14/2014 1506   BUN 18 03/08/2017 1437   BUN 13 11/14/2015 1623   BUN 31 (H) 11/14/2014 1506   CREATININE 1.07 (H) 03/08/2017 1437   CREATININE 1.08 (H) 11/14/2014 1506   CALCIUM 8.7 (L) 03/08/2017 1437   CALCIUM 9.5 11/14/2014 1506   PROT 6.4 (L) 03/08/2017 1437   ALBUMIN 3.8 03/08/2017 1437   ALBUMIN 3.9 11/14/2015 1623   AST 44 (H) 03/08/2017 1437   ALT 55 (H) 03/08/2017 1437   ALKPHOS 112 03/08/2017 1437   BILITOT 0.9 03/08/2017 1437   GFRNONAA 51 (L) 03/08/2017 1437   GFRNONAA 53 (L) 11/14/2014 1506   GFRAA 60 (L) 03/08/2017 1437   GFRAA >60 11/14/2014 1506    No results found for: SPEP, UPEP  Lab Results  Component Value Date   WBC 8.5 03/08/2017   NEUTROABS 5.9 03/08/2017   HGB 13.6 03/08/2017   HCT 38.0 03/08/2017   MCV 87.3 03/08/2017   PLT 269 03/08/2017      Chemistry      Component Value Date/Time   NA 136 03/08/2017 1437   NA 148 (H) 11/14/2015 1623   NA 142 11/14/2014  1506   K 3.6 03/08/2017 1437   K 3.8 11/14/2014 1506   CL 104 03/08/2017 1437   CL 110 11/14/2014 1506   CO2 23 03/08/2017 1437   CO2 23 11/14/2014 1506   BUN 18 03/08/2017 1437   BUN 13 11/14/2015 1623   BUN 31 (H) 11/14/2014 1506   CREATININE 1.07 (H) 03/08/2017 1437   CREATININE 1.08 (H) 11/14/2014 1506      Component Value Date/Time   CALCIUM 8.7 (L) 03/08/2017 1437   CALCIUM 9.5 11/14/2014 1506   ALKPHOS 112 03/08/2017 1437   AST 44 (H) 03/08/2017 1437   ALT 55 (H) 03/08/2017 1437   BILITOT 0.9 03/08/2017 1437     Conclusion:  - Progressed abnormal T2 and FLAIR hyperintensity in right hemisphere white matter and the thalamus since January.  - similar abnormal T2 and FLAIR hyperintensity has also developed in both cerebellar hemispheres.  - No associated enhancement. And no mass effect, but rather perhaps there is some volume loss in the affected areas.  - It is unclear whether these changes reflect progressed infiltrative glioblastoma or an inflammatory encephalitis such as related to therapy or a superimposed infection. Has there been a change in therapy since January? Is the patient immunocompromised? CSF analysis might be valuable.   Electronically Signed   By: Genevie Ann M.D.   On: 03/11/2017 09:03   RADIOGRAPHIC STUDIES: I have personally reviewed the radiological images as listed and agreed with the findings in the report. No results found.   ASSESSMENT & PLAN:  Glioblastoma determined by biopsy of brain ([place]) Right temporal GBM- status post resection followed by chemoradiation; currently status post 8 adjuvant cycles [stopped Temodar in June 2018-secondary to intolerance]. MRI shows- signal changes suggestive of recurrence with thalamic area/tumor bed; also shows enhancement in the cerebellar region [unusual/new-C discussion below].  # Patient seems to be symptomatic- however this seems to be multifactorial. Given the significant  improvement on  steroids- I suspect a component of adrenal insufficiency.  # Given the progressive changes as noted on the MRI- I would recommend starting the patient Avastin. I reviewed the case with the patient's neurosurgeon Dr. Kathyrn Sheriff who plans to present the case at the tumor conference [place].  # Reviewed the rationale for using Avastin. Discussed the potential side effects including but not limited to elevated blood pressure ; nephrotic syndrome wound healing problems.  # Intractable nausea vomiting- adrenal insufficiency/worsening GBM- significant improvement on steroids. Continue dexamethasone 4 mg twice a day. We'll taper slowly.  # Acute renal failure- precarinal creatinine up to 3 on the recent admission; currently resolved.  # History of thyroid cancer- on Synthroid. Recent TSH normal. Continue Synthroid.   # Debility/limited ambulation- recommend home health.   # Agree with palliative care evaluation given the concerns for progression in the brain. I would at least try to see if patient would improve on Avastin- before discussing about hospice.  # Patient's case was also discussed the tumor conference.   # Start Avastin next week; follow-up with me and every 2 weeks.    Orders Placed This Encounter  Procedures  . Urinalysis, Complete w Microscopic    Standing Status:   Standing    Number of Occurrences:   3    Standing Expiration Date:   03/11/2018  . CBC with Differential/Platelet    Standing Status:   Standing    Number of Occurrences:   3    Standing Expiration Date:   03/11/2018  . Comprehensive metabolic panel    Standing Status:   Standing    Number of Occurrences:   3    Standing Expiration Date:   03/11/2018      Cammie Sickle, MD 03/12/2017 6:31 PM

## 2017-03-11 NOTE — Assessment & Plan Note (Addendum)
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 MRI shows- signal changes suggestive of recurrence with thalamic area/tumor bed; also shows enhancement in the cerebellar region [unusual/new-C discussion below].  # Patient seems to be symptomatic- however this seems to be multifactorial. Given the significant improvement on steroids- I suspect a component of adrenal insufficiency.  # Given the progressive changes as noted on the MRI- I would recommend starting the patient Avastin. I reviewed the case with the patient's neurosurgeon Dr. Kathyrn Sheriff who plans to present the case at the tumor conference [place].  # Reviewed the rationale for using Avastin. Discussed the potential side effects including but not limited to elevated blood pressure ; nephrotic syndrome wound healing problems.  # Intractable nausea vomiting- adrenal insufficiency/worsening GBM- significant improvement on steroids. Continue dexamethasone 4 mg twice a day. We'll taper slowly.  # Acute renal failure- precarinal creatinine up to 3 on the recent admission; currently resolved.  # History of thyroid cancer- on Synthroid. Recent TSH normal. Continue Synthroid.   # Debility/limited ambulation- recommend home health.   # Agree with palliative care evaluation given the concerns for progression in the brain. I would at least try to see if patient would improve on Avastin- before discussing about hospice.  # Patient's case was also discussed the tumor conference.   # Start Avastin next week; follow-up with me and every 2 weeks.    Addendum:   Patient's case was also discussed the neurology tumor conference in [place]. As per the discussion, thru Dr.Veslav - it appears that patient MRI is not very suggestive of progression of GBM [although unable to explain the weight is changes noted on the recent MRI in  August 2018]. . Plan to hold Avastin.   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

## 2017-03-11 NOTE — Patient Instructions (Signed)
Bevacizumab injection What is this medicine? BEVACIZUMAB (be va SIZ yoo mab) is a monoclonal antibody. It is used to treat many types of cancer. This medicine may be used for other purposes; ask your health care provider or pharmacist if you have questions. COMMON BRAND NAME(S): Avastin What should I tell my health care provider before I take this medicine? They need to know if you have any of these conditions: -diabetes -heart disease -high blood pressure -history of coughing up blood -prior anthracycline chemotherapy (e.g., doxorubicin, daunorubicin, epirubicin) -recent or ongoing radiation therapy -recent or planning to have surgery -stroke -an unusual or allergic reaction to bevacizumab, hamster proteins, mouse proteins, other medicines, foods, dyes, or preservatives -pregnant or trying to get pregnant -breast-feeding How should I use this medicine? This medicine is for infusion into a vein. It is given by a health care professional in a hospital or clinic setting. Talk to your pediatrician regarding the use of this medicine in children. Special care may be needed. Overdosage: If you think you have taken too much of this medicine contact a poison control center or emergency room at once. NOTE: This medicine is only for you. Do not share this medicine with others. What if I miss a dose? It is important not to miss your dose. Call your doctor or health care professional if you are unable to keep an appointment. What may interact with this medicine? Interactions are not expected. This list may not describe all possible interactions. Give your health care provider a list of all the medicines, herbs, non-prescription drugs, or dietary supplements you use. Also tell them if you smoke, drink alcohol, or use illegal drugs. Some items may interact with your medicine. What should I watch for while using this medicine? Your condition will be monitored carefully while you are receiving this  medicine. You will need important blood work and urine testing done while you are taking this medicine. This medicine may increase your risk to bruise or bleed. Call your doctor or health care professional if you notice any unusual bleeding. This medicine should be started at least 28 days following major surgery and the site of the surgery should be totally healed. Check with your doctor before scheduling dental work or surgery while you are receiving this treatment. Talk to your doctor if you have recently had surgery or if you have a wound that has not healed. Do not become pregnant while taking this medicine or for 6 months after stopping it. Women should inform their doctor if they wish to become pregnant or think they might be pregnant. There is a potential for serious side effects to an unborn child. Talk to your health care professional or pharmacist for more information. Do not breast-feed an infant while taking this medicine and for 6 months after the last dose. This medicine has caused ovarian failure in some women. This medicine may interfere with the ability to have a child. You should talk to your doctor or health care professional if you are concerned about your fertility. What side effects may I notice from receiving this medicine? Side effects that you should report to your doctor or health care professional as soon as possible: -allergic reactions like skin rash, itching or hives, swelling of the face, lips, or tongue -chest pain or chest tightness -chills -coughing up blood -high fever -seizures -severe constipation -signs and symptoms of bleeding such as bloody or black, tarry stools; red or dark-brown urine; spitting up blood or brown material that looks   like coffee grounds; red spots on the skin; unusual bruising or bleeding from the eye, gums, or nose -signs and symptoms of a blood clot such as breathing problems; chest pain; severe, sudden headache; pain, swelling, warmth in  the leg -signs and symptoms of a stroke like changes in vision; confusion; trouble speaking or understanding; severe headaches; sudden numbness or weakness of the face, arm or leg; trouble walking; dizziness; loss of balance or coordination -stomach pain -sweating -swelling of legs or ankles -vomiting -weight gain Side effects that usually do not require medical attention (report to your doctor or health care professional if they continue or are bothersome): -back pain -changes in taste -decreased appetite -dry skin -nausea -tiredness This list may not describe all possible side effects. Call your doctor for medical advice about side effects. You may report side effects to FDA at 1-800-FDA-1088. Where should I keep my medicine? This drug is given in a hospital or clinic and will not be stored at home. NOTE: This sheet is a summary. It may not cover all possible information. If you have questions about this medicine, talk to your doctor, pharmacist, or health care provider.  2018 Elsevier/Gold Standard (2016-07-24 14:33:29)  

## 2017-03-11 NOTE — Progress Notes (Signed)
START ON PATHWAY REGIMEN - Neuro     A cycle is every 14 days:     Bevacizumab   **Always confirm dose/schedule in your pharmacy ordering system**    Patient Characteristics: Glioblastoma, Anaplastic Astrocytoma, and Anaplastic Oligodendroglioma, Recurrent or Progressive, Nonsurgical Candidate, Systemic Therapy Candidate Disease Status: Recurrent or Progressive Disease Classification: Glioblastoma Treatment Classification: Nonsurgical Candidate Treatment (Nonsurgical/Adjuvant): Systemic Therapy Candidate Would you be surprised if this patient died  in the next year<= I would be surprised if this patient died in the next year Intent of Therapy: Non-Curative / Palliative Intent, Discussed with Patient 

## 2017-03-12 ENCOUNTER — Telehealth: Payer: Self-pay | Admitting: *Deleted

## 2017-03-12 ENCOUNTER — Telehealth: Payer: Self-pay | Admitting: Family Medicine

## 2017-03-12 DIAGNOSIS — M6281 Muscle weakness (generalized): Secondary | ICD-10-CM | POA: Diagnosis not present

## 2017-03-12 DIAGNOSIS — E039 Hypothyroidism, unspecified: Secondary | ICD-10-CM | POA: Diagnosis not present

## 2017-03-12 DIAGNOSIS — Z9089 Acquired absence of other organs: Secondary | ICD-10-CM | POA: Diagnosis not present

## 2017-03-12 DIAGNOSIS — C719 Malignant neoplasm of brain, unspecified: Secondary | ICD-10-CM | POA: Diagnosis not present

## 2017-03-12 DIAGNOSIS — R262 Difficulty in walking, not elsewhere classified: Secondary | ICD-10-CM | POA: Diagnosis not present

## 2017-03-12 DIAGNOSIS — F329 Major depressive disorder, single episode, unspecified: Secondary | ICD-10-CM | POA: Diagnosis not present

## 2017-03-12 DIAGNOSIS — M109 Gout, unspecified: Secondary | ICD-10-CM | POA: Diagnosis not present

## 2017-03-12 DIAGNOSIS — Z85850 Personal history of malignant neoplasm of thyroid: Secondary | ICD-10-CM | POA: Diagnosis not present

## 2017-03-12 DIAGNOSIS — G40909 Epilepsy, unspecified, not intractable, without status epilepticus: Secondary | ICD-10-CM | POA: Diagnosis not present

## 2017-03-12 DIAGNOSIS — Z7984 Long term (current) use of oral hypoglycemic drugs: Secondary | ICD-10-CM | POA: Diagnosis not present

## 2017-03-12 DIAGNOSIS — I10 Essential (primary) hypertension: Secondary | ICD-10-CM | POA: Diagnosis not present

## 2017-03-12 DIAGNOSIS — E119 Type 2 diabetes mellitus without complications: Secondary | ICD-10-CM | POA: Diagnosis not present

## 2017-03-12 DIAGNOSIS — Z87891 Personal history of nicotine dependence: Secondary | ICD-10-CM | POA: Diagnosis not present

## 2017-03-12 DIAGNOSIS — E785 Hyperlipidemia, unspecified: Secondary | ICD-10-CM | POA: Diagnosis not present

## 2017-03-12 NOTE — Telephone Encounter (Signed)
Please find out who made the referral, it wasn't me

## 2017-03-12 NOTE — Telephone Encounter (Signed)
Gave order for Home Health nursing assessment for medication management. Patient is also pending palliative care consult.

## 2017-03-12 NOTE — Telephone Encounter (Signed)
Palliative Care Orders faxed.

## 2017-03-12 NOTE — Telephone Encounter (Signed)
-----   Message from Manus Rudd, RN sent at 03/09/2017  3:42 PM EDT ----- Regarding: Paliative care order [facility] needs signed palliative care order faxed.to [phone]

## 2017-03-12 NOTE — Telephone Encounter (Signed)
Spoke with [name] and she states orders came from the hospital when patient was in from 7/24-7/26 and order was for [employer] officer, [employer]. [name] states when she came to assess the patient she saw how confused and forgetful she was and her medications are all messed up and states patient is for sure would benefit from medication management help to help her. CB to [name] is [phone]

## 2017-03-12 NOTE — Telephone Encounter (Signed)
Please review-aa 

## 2017-03-12 NOTE — Telephone Encounter (Signed)
[facility] with [place] request a verbal order for nursing to go to pt home for medication management.  FW#[phone]/MO

## 2017-03-15 DIAGNOSIS — M6281 Muscle weakness (generalized): Secondary | ICD-10-CM | POA: Diagnosis not present

## 2017-03-15 DIAGNOSIS — E119 Type 2 diabetes mellitus without complications: Secondary | ICD-10-CM | POA: Diagnosis not present

## 2017-03-15 DIAGNOSIS — G40909 Epilepsy, unspecified, not intractable, without status epilepticus: Secondary | ICD-10-CM | POA: Diagnosis not present

## 2017-03-15 DIAGNOSIS — I10 Essential (primary) hypertension: Secondary | ICD-10-CM | POA: Diagnosis not present

## 2017-03-15 DIAGNOSIS — R262 Difficulty in walking, not elsewhere classified: Secondary | ICD-10-CM | POA: Diagnosis not present

## 2017-03-15 DIAGNOSIS — C719 Malignant neoplasm of brain, unspecified: Secondary | ICD-10-CM | POA: Diagnosis not present

## 2017-03-16 ENCOUNTER — Inpatient Hospital Stay: Payer: Medicare Other

## 2017-03-16 ENCOUNTER — Telehealth: Payer: Self-pay | Admitting: Internal Medicine

## 2017-03-16 NOTE — Telephone Encounter (Signed)
msg sent to scheduling team to cnl apts below and schedule new apts on 8/17 per md order

## 2017-03-16 NOTE — Telephone Encounter (Signed)
I spoke to the patient that her case was discussed at tumor conference at [facility]. Less likely progression of disease in the brain; recommend holding Avastin at this time. We'll repeat brain MRI in 2 months. CANCEL THE CHEMO class/chemo infusion.   # I asked patient to take/taper dexamethasone [4 mg] 1 pill a day in AM [starting today] x 3 days; and then 2 mg a day [starting 8/10]  # Make appointment for the patient to see me on August 17th. at 3 PM- NO LABS.

## 2017-03-17 ENCOUNTER — Telehealth: Payer: Self-pay | Admitting: Family Medicine

## 2017-03-17 DIAGNOSIS — C719 Malignant neoplasm of brain, unspecified: Secondary | ICD-10-CM | POA: Diagnosis not present

## 2017-03-17 DIAGNOSIS — G40909 Epilepsy, unspecified, not intractable, without status epilepticus: Secondary | ICD-10-CM | POA: Diagnosis not present

## 2017-03-17 DIAGNOSIS — E119 Type 2 diabetes mellitus without complications: Secondary | ICD-10-CM | POA: Diagnosis not present

## 2017-03-17 DIAGNOSIS — R262 Difficulty in walking, not elsewhere classified: Secondary | ICD-10-CM | POA: Diagnosis not present

## 2017-03-17 DIAGNOSIS — M6281 Muscle weakness (generalized): Secondary | ICD-10-CM | POA: Diagnosis not present

## 2017-03-17 DIAGNOSIS — I10 Essential (primary) hypertension: Secondary | ICD-10-CM | POA: Diagnosis not present

## 2017-03-17 NOTE — Telephone Encounter (Signed)
Amy with [facility] request a verbal order for occupational therapy and a medical social worker.  CB#[phone] or [phone]

## 2017-03-18 ENCOUNTER — Inpatient Hospital Stay: Payer: Medicare Other

## 2017-03-18 ENCOUNTER — Telehealth: Payer: Self-pay | Admitting: *Deleted

## 2017-03-18 NOTE — Telephone Encounter (Signed)
Please review-aa 

## 2017-03-18 NOTE — Telephone Encounter (Signed)
Orders given. Palliative care also pending.

## 2017-03-18 NOTE — Telephone Encounter (Signed)
Received phone call from [place] from Referral Intake for Martha [name]  Patient was reached by pallative care [name] to schedule an apt. Patient refused palliative care referral and stated that she was feeling "better and didn't want to initiate a palliative care referral at this time."

## 2017-03-19 DIAGNOSIS — G40909 Epilepsy, unspecified, not intractable, without status epilepticus: Secondary | ICD-10-CM | POA: Diagnosis not present

## 2017-03-19 DIAGNOSIS — C719 Malignant neoplasm of brain, unspecified: Secondary | ICD-10-CM | POA: Diagnosis not present

## 2017-03-19 DIAGNOSIS — I10 Essential (primary) hypertension: Secondary | ICD-10-CM | POA: Diagnosis not present

## 2017-03-19 DIAGNOSIS — R262 Difficulty in walking, not elsewhere classified: Secondary | ICD-10-CM | POA: Diagnosis not present

## 2017-03-19 DIAGNOSIS — M6281 Muscle weakness (generalized): Secondary | ICD-10-CM | POA: Diagnosis not present

## 2017-03-19 DIAGNOSIS — E119 Type 2 diabetes mellitus without complications: Secondary | ICD-10-CM | POA: Diagnosis not present

## 2017-03-22 DIAGNOSIS — M6281 Muscle weakness (generalized): Secondary | ICD-10-CM | POA: Diagnosis not present

## 2017-03-22 DIAGNOSIS — G40909 Epilepsy, unspecified, not intractable, without status epilepticus: Secondary | ICD-10-CM | POA: Diagnosis not present

## 2017-03-22 DIAGNOSIS — I10 Essential (primary) hypertension: Secondary | ICD-10-CM | POA: Diagnosis not present

## 2017-03-22 DIAGNOSIS — R262 Difficulty in walking, not elsewhere classified: Secondary | ICD-10-CM | POA: Diagnosis not present

## 2017-03-22 DIAGNOSIS — E119 Type 2 diabetes mellitus without complications: Secondary | ICD-10-CM | POA: Diagnosis not present

## 2017-03-22 DIAGNOSIS — C719 Malignant neoplasm of brain, unspecified: Secondary | ICD-10-CM | POA: Diagnosis not present

## 2017-03-23 ENCOUNTER — Telehealth: Payer: Self-pay

## 2017-03-23 DIAGNOSIS — C719 Malignant neoplasm of brain, unspecified: Secondary | ICD-10-CM | POA: Diagnosis not present

## 2017-03-23 DIAGNOSIS — G40909 Epilepsy, unspecified, not intractable, without status epilepticus: Secondary | ICD-10-CM | POA: Diagnosis not present

## 2017-03-23 DIAGNOSIS — R262 Difficulty in walking, not elsewhere classified: Secondary | ICD-10-CM | POA: Diagnosis not present

## 2017-03-23 DIAGNOSIS — E119 Type 2 diabetes mellitus without complications: Secondary | ICD-10-CM | POA: Diagnosis not present

## 2017-03-23 DIAGNOSIS — M6281 Muscle weakness (generalized): Secondary | ICD-10-CM | POA: Diagnosis not present

## 2017-03-23 DIAGNOSIS — I10 Essential (primary) hypertension: Secondary | ICD-10-CM | POA: Diagnosis not present

## 2017-03-23 NOTE — Telephone Encounter (Signed)
OK 

## 2017-03-23 NOTE — Telephone Encounter (Signed)
Martha [name] was given ok.

## 2017-03-23 NOTE — Telephone Encounter (Signed)
Izora Gala from [facility] called requesting an order for OT 2x a week for 1 week and then 1x a week for 1 week. Please call back [phone]. Thanks!

## 2017-03-24 ENCOUNTER — Other Ambulatory Visit: Payer: Self-pay

## 2017-03-24 NOTE — Patient Outreach (Signed)
[facility]) Care Management  03/24/2017  Martha [name] October 21, 1945 [id number]   EMMI-GENERAL DISCHARGE RED ON EMMI ALERT Day # 1 Date: 03/07/17 Red Alert Reason: "Transportation to follow up? No"    Multiple attempts to establish contact with patient without success. No response from letter mailed to patient. Case is being closed at this time.      Plan: RN CM will notify [facility] administrative assistant of case status.   Enzo Montgomery, RN,BSN,CCM [place] Management Telephonic Care Management Coordinator Direct Phone: [phone] Toll Free: [phone] Fax: [phone]

## 2017-03-26 ENCOUNTER — Inpatient Hospital Stay (HOSPITAL_BASED_OUTPATIENT_CLINIC_OR_DEPARTMENT_OTHER): Payer: Medicare Other | Admitting: Internal Medicine

## 2017-03-26 VITALS — BP 139/88 | HR 97 | Temp 97.8°F | Resp 20 | Ht 61.0 in | Wt 156.2 lb

## 2017-03-26 DIAGNOSIS — N179 Acute kidney failure, unspecified: Secondary | ICD-10-CM

## 2017-03-26 DIAGNOSIS — E2740 Unspecified adrenocortical insufficiency: Secondary | ICD-10-CM | POA: Diagnosis not present

## 2017-03-26 DIAGNOSIS — R5381 Other malaise: Secondary | ICD-10-CM

## 2017-03-26 DIAGNOSIS — C719 Malignant neoplasm of brain, unspecified: Secondary | ICD-10-CM

## 2017-03-26 DIAGNOSIS — Z923 Personal history of irradiation: Secondary | ICD-10-CM | POA: Diagnosis not present

## 2017-03-26 DIAGNOSIS — Z7952 Long term (current) use of systemic steroids: Secondary | ICD-10-CM | POA: Diagnosis not present

## 2017-03-26 DIAGNOSIS — Z79899 Other long term (current) drug therapy: Secondary | ICD-10-CM

## 2017-03-26 DIAGNOSIS — I10 Essential (primary) hypertension: Secondary | ICD-10-CM | POA: Diagnosis not present

## 2017-03-26 DIAGNOSIS — C712 Malignant neoplasm of temporal lobe: Secondary | ICD-10-CM | POA: Diagnosis not present

## 2017-03-26 NOTE — Progress Notes (Signed)
[place] OFFICE PROGRESS NOTE  Patient Care Team: Carmon Ginsberg, [place] as PCP - General (Family Medicine)  No matching staging information was found for the patient.   Oncology History   1. 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 (January, 2017; s/p resection Dr.Nundkumar; [place]) 2 starting radiation therapy and Temodar January of 2017 3.She has finished radiation therapy in April of 2017 and also finished Temodar for approximately 6 weeks 4.Patient started on maintenance Temodar from Jan 03, 2016; July 2017- MRI no recurrence; left hemisphere- whitish plaques ? 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    #Thyroid cancer s/p Thyroidectomy [s/p RAUI; 1967]     Glioblastoma determined by biopsy of brain ([place])   08/27/2015 Initial Diagnosis    Glioblastoma determined by biopsy of brain ([place])       Glioblastoma ([place])    Initial Diagnosis    Glioblastoma ([place])        INTERVAL HISTORY:  Martha [name] 71 y.o.  female pleasant patient above history of GBM right frontal status post resection; Status post adjuvant Temodar 8 months [stopped in June 2018 sec to intol].  Patient since being started on steroids dexamethasone 4 mg twice a day; significant improvement of her symptoms.  Patient stated that she is sleeping well. Appetite is good. No nausea no vomiting no headaches. No abdominal pain.  States that she has been working with physical therapy. Today she is in a wheelchair.. Accompanied by family friend.  REVIEW OF SYSTEMS:  A complete 10 point review of system is done which is negative except mentioned above/history of present illness.   PAST MEDICAL HISTORY :  Past Medical History:  Diagnosis Date  . Cancer [facility])    thyroid cancer treated with radioactive iodine  . Depression   . Diabetes mellitus without  complication ([place])   . Epilepsy ([place])   . Glioblastoma determined by biopsy of brain ([place])   . Hyperlipidemia   . Hypertension   . Thyroid disease    h/o thyroid ca- 30 years ago  . Varicose vein     PAST SURGICAL HISTORY :   Past Surgical History:  Procedure Laterality Date  . APPLICATION OF CRANIAL NAVIGATION N/A 08/02/2015   Procedure: APPLICATION OF CRANIAL NAVIGATION;  Surgeon: Consuella Lose, MD;  Location: [place] NEURO ORS;  Service: Neurosurgery;  Laterality: N/A;  APPLICATION OF CRANIAL NAVIGATION  . BILATERAL CARPAL TUNNEL RELEASE  1990,1991   left hand in 1990 right hand in '91  . CERVICAL SPINE SURGERY  05/1997   spinal fusion  . CHOLECYSTECTOMY  04/24/2008   status post laparoscopic Dr.Ely  . CRANIOTOMY Right 08/02/2015   Procedure: CRANIOTOMY TUMOR EXCISION;  Surgeon: Consuella Lose, MD;  Location: [place] NEURO ORS;  Service: Neurosurgery;  Laterality: Right;  CRANIOTOMY TUMOR EXCISION  . radioactive iodine    . THYROIDECTOMY, PARTIAL  1964   due to cancerous tumor  . TOE SURGERY Left 1989    FAMILY HISTORY :   Family History  Problem Relation Age of Onset  . Arthritis Mother   . Hyperlipidemia Mother   . Hypertension Mother   . Heart disease Mother   . Diabetes Mother        type 2  . Thyroid disease Mother   . Cancer Father        colon  . Alcohol abuse Brother   .  Hyperlipidemia Brother   . Hypertension Brother   . Breast cancer Neg Hx   . Ovarian cancer Neg Hx     SOCIAL HISTORY:   Social History  Substance Use Topics  . Smoking status: Former Research scientist (life sciences)  . Smokeless tobacco: Never Used     Comment: quit smoking in 2007  . Alcohol use No    ALLERGIES:  is allergic to ampicillin; fish-derived products; tomato; and penicillins.  MEDICATIONS:  Current Outpatient Prescriptions  Medication Sig Dispense Refill  . acetaminophen (TYLENOL) 500 MG tablet Take 500 mg by mouth every 6 (six) hours as needed for mild pain or moderate pain.    Marland Kitchen allopurinol  (ZYLOPRIM) 100 MG tablet Take 2 tablets (200 mg total) by mouth daily. 60 tablet 0  . colchicine (COLCRYS) 0.6 MG tablet Take 1 tablet (0.6 mg total) by mouth daily. 90 tablet 3  . glipiZIDE (GLUCOTROL) 5 MG tablet TAKE 1 TABLET BY MOUTH TWICE A DAY 30 MINUTES BEFORE A MEAL 60 tablet 3  . levETIRAcetam (KEPPRA) 500 MG tablet Take 1 tablet (500 mg total) by mouth 2 (two) times daily. Reported on 12/11/2015    . levothyroxine (SYNTHROID, LEVOTHROID) 100 MCG tablet TAKE 1 TABLET (100 MCG TOTAL) BY MOUTH DAILY. 30 tablet 3  . lisinopril (PRINIVIL,ZESTRIL) 20 MG tablet Take 20 mg by mouth daily.  3  . metoCLOPramide (REGLAN) 10 MG tablet Take 1 tablet (10 mg total) by mouth every 6 (six) hours as needed for nausea. 12 tablet 0  . nortriptyline (PAMELOR) 10 MG capsule TAKE ONE TO THREE CAPSULES AT BEDTIME TO HELP WITH SLEEP AND NERVE PAIN 30 capsule 0  . pravastatin (PRAVACHOL) 40 MG tablet Take 40 mg by mouth daily.  1  . feeding supplement, ENSURE ENLIVE, (ENSURE ENLIVE) LIQD Take 237 mLs by mouth 3 (three) times daily between meals. (Patient not taking: Reported on 03/26/2017) 90 Bottle 0  . promethazine (PHENERGAN) 25 MG tablet Take 1 tablet (25 mg total) by mouth every 6 (six) hours as needed for nausea or vomiting. (Patient not taking: Reported on 03/26/2017) 30 tablet 0   No current facility-administered medications for this visit.     PHYSICAL EXAMINATION: ECOG PERFORMANCE STATUS: 1 - Symptomatic but completely ambulatory  BP 139/88   Pulse 97   Temp 97.8 F (36.6 C) (Tympanic)   Resp 20   Ht 5\' 1"  (1.549 m)   Wt 156 lb 3.2 oz (70.9 kg)   BMI 29.51 kg/m   Filed Weights   03/26/17 1517  Weight: 156 lb 3.2 oz (70.9 kg)    GENERAL: Well-nourished well-developed; Alert,She seems to be more energetic today. She is walking herself She is accompanied by her friend.  EYES: no pallor or icterus OROPHARYNX: no thrush or ulceration; good dentition  NECK: supple, no masses felt LYMPH:  no  palpable lymphadenopathy in the cervical, axillary or inguinal regions LUNGS: clear to auscultation and  No wheeze or crackles HEART/CVS: regular rate & rhythm and no murmurs; No lower extremity edema ABDOMEN:abdomen soft, non-tender and normal bowel sounds Musculoskeletal:no cyanosis of digits and no clubbing  PSYCH: alert & oriented x 3 with fluent speech NEURO: no focal motor/sensory deficits; No tremors. SKIN:  no rashes or significant lesions  LABORATORY DATA:  I have reviewed the data as listed    Component Value Date/Time   NA 136 03/08/2017 1437   NA 148 (H) 11/14/2015 1623   NA 142 11/14/2014 1506   K 3.6 03/08/2017 1437  K 3.8 11/14/2014 1506   CL 104 03/08/2017 1437   CL 110 11/14/2014 1506   CO2 23 03/08/2017 1437   CO2 23 11/14/2014 1506   GLUCOSE 174 (H) 03/08/2017 1437   GLUCOSE 104 (H) 11/14/2014 1506   BUN 18 03/08/2017 1437   BUN 13 11/14/2015 1623   BUN 31 (H) 11/14/2014 1506   CREATININE 1.07 (H) 03/08/2017 1437   CREATININE 1.08 (H) 11/14/2014 1506   CALCIUM 8.7 (L) 03/08/2017 1437   CALCIUM 9.5 11/14/2014 1506   PROT 6.4 (L) 03/08/2017 1437   ALBUMIN 3.8 03/08/2017 1437   ALBUMIN 3.9 11/14/2015 1623   AST 44 (H) 03/08/2017 1437   ALT 55 (H) 03/08/2017 1437   ALKPHOS 112 03/08/2017 1437   BILITOT 0.9 03/08/2017 1437   GFRNONAA 51 (L) 03/08/2017 1437   GFRNONAA 53 (L) 11/14/2014 1506   GFRAA 60 (L) 03/08/2017 1437   GFRAA >60 11/14/2014 1506    No results found for: SPEP, UPEP  Lab Results  Component Value Date   WBC 8.5 03/08/2017   NEUTROABS 5.9 03/08/2017   HGB 13.6 03/08/2017   HCT 38.0 03/08/2017   MCV 87.3 03/08/2017   PLT 269 03/08/2017      Chemistry      Component Value Date/Time   NA 136 03/08/2017 1437   NA 148 (H) 11/14/2015 1623   NA 142 11/14/2014 1506   K 3.6 03/08/2017 1437   K 3.8 11/14/2014 1506   CL 104 03/08/2017 1437   CL 110 11/14/2014 1506   CO2 23 03/08/2017 1437   CO2 23 11/14/2014 1506   BUN 18  03/08/2017 1437   BUN 13 11/14/2015 1623   BUN 31 (H) 11/14/2014 1506   CREATININE 1.07 (H) 03/08/2017 1437   CREATININE 1.08 (H) 11/14/2014 1506      Component Value Date/Time   CALCIUM 8.7 (L) 03/08/2017 1437   CALCIUM 9.5 11/14/2014 1506   ALKPHOS 112 03/08/2017 1437   AST 44 (H) 03/08/2017 1437   ALT 55 (H) 03/08/2017 1437   BILITOT 0.9 03/08/2017 1437     Conclusion:  - Progressed abnormal T2 and FLAIR hyperintensity in right hemisphere white matter and the thalamus since January.  - similar abnormal T2 and FLAIR hyperintensity has also developed in both cerebellar hemispheres.  - No associated enhancement. And no mass effect, but rather perhaps there is some volume loss in the affected areas.  - It is unclear whether these changes reflect progressed infiltrative glioblastoma or an inflammatory encephalitis such as related to therapy or a superimposed infection. Has there been a change in therapy since January? Is the patient immunocompromised? CSF analysis might be valuable.   Electronically Signed   By: Genevie Ann M.D.   On: 03/11/2017 09:03   RADIOGRAPHIC STUDIES: I have personally reviewed the radiological images as listed and agreed with the findings in the report. No results found.   ASSESSMENT & PLAN:  Glioblastoma ([place]) Right temporal GBM- status post resection followed by chemoradiation; currently status post 8 adjuvant cycles [stopped Temodar in June 2018-secondary to intolerance].  # 03/09/2017- MRI shows- signal changes ? 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 Significant improvement on steroids. Patient  currently on dexamethasone 4 mg  a day; recommend cutting down to 2 mg once a day  # History of thyroid cancer- on Synthroid. Recent TSH normal. Continue Synthroid. Check TSH at next visit  # Debility/steroid myopathy- agree with PT.   # follow up in 4 weeks/labs. We will plan MRI in September/October 2018.   No orders of the defined types were placed in this encounter.     Cammie Sickle, MD 03/26/2017 4:08 PM

## 2017-03-26 NOTE — Assessment & Plan Note (Addendum)
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  # 03/09/2017- MRI shows- signal changes ? 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 Significant improvement on steroids. Patient currently on dexamethasone 4 mg a day; recommend cutting down to 2 mg once a day  # History of thyroid cancer- on Synthroid. Recent TSH normal. Continue Synthroid. Check TSH at next visit  # Debility/steroid myopathy- agree with PT.   # follow up in 4 weeks/labs. We will plan MRI in September/October 2018.

## 2017-03-26 NOTE — Patient Instructions (Signed)
Recommend taking dexamethasone 2 mg [1/2 a pill once a day in morning] until next visit; DO NOT STOP.

## 2017-03-29 DIAGNOSIS — E119 Type 2 diabetes mellitus without complications: Secondary | ICD-10-CM | POA: Diagnosis not present

## 2017-03-29 DIAGNOSIS — C719 Malignant neoplasm of brain, unspecified: Secondary | ICD-10-CM | POA: Diagnosis not present

## 2017-03-29 DIAGNOSIS — G40909 Epilepsy, unspecified, not intractable, without status epilepticus: Secondary | ICD-10-CM | POA: Diagnosis not present

## 2017-03-29 DIAGNOSIS — M6281 Muscle weakness (generalized): Secondary | ICD-10-CM | POA: Diagnosis not present

## 2017-03-29 DIAGNOSIS — I10 Essential (primary) hypertension: Secondary | ICD-10-CM | POA: Diagnosis not present

## 2017-03-29 DIAGNOSIS — R262 Difficulty in walking, not elsewhere classified: Secondary | ICD-10-CM | POA: Diagnosis not present

## 2017-03-30 ENCOUNTER — Ambulatory Visit (INDEPENDENT_AMBULATORY_CARE_PROVIDER_SITE_OTHER): Payer: Medicare Other | Admitting: Obstetrics and Gynecology

## 2017-03-30 ENCOUNTER — Encounter: Payer: Self-pay | Admitting: Obstetrics and Gynecology

## 2017-03-30 VITALS — BP 153/87 | HR 93 | Ht 61.0 in | Wt 164.8 lb

## 2017-03-30 DIAGNOSIS — Z01419 Encounter for gynecological examination (general) (routine) without abnormal findings: Secondary | ICD-10-CM | POA: Diagnosis not present

## 2017-03-30 DIAGNOSIS — Z13820 Encounter for screening for osteoporosis: Secondary | ICD-10-CM | POA: Diagnosis not present

## 2017-03-30 DIAGNOSIS — E669 Obesity, unspecified: Secondary | ICD-10-CM | POA: Diagnosis not present

## 2017-03-30 DIAGNOSIS — Z124 Encounter for screening for malignant neoplasm of cervix: Secondary | ICD-10-CM | POA: Diagnosis not present

## 2017-03-30 DIAGNOSIS — Z1231 Encounter for screening mammogram for malignant neoplasm of breast: Secondary | ICD-10-CM

## 2017-03-30 DIAGNOSIS — Z1239 Encounter for other screening for malignant neoplasm of breast: Secondary | ICD-10-CM

## 2017-03-30 NOTE — Progress Notes (Signed)
ANNUAL PREVENTATIVE CARE GYNECOLOGY  ENCOUNTER NOTE  Subjective:       Martha [name] is a 71 y.o. G0P0000 female here for a routine annual gynecologic exam.  Patient with remote h/o thyroid cancer s/p thyroidectomy, brain cancer with mets (s/p chemotherapy). The patient has never been sexually active. The patient has never been taking hormone replacement therapy.  She denies complaints today. Patient denies post-menopausal vaginal bleeding. The patient wears seatbelts: yes. The patient participates in regular exercise: yes. Has the patient ever been transfused or tattooed?: no. The patient reports that there is not domestic violence in her life.    Gynecologic History No LMP recorded. Patient is postmenopausal. Contraception: post menopausal status Last Pap: 2015. Results were: normal Last mammogram: 2015. Results were: normal.  H/o remote abnormal mammogram.  Last Colonoscopy: 2015.  Results: diverticulitis and diverticulosis  Last Dexa Scan: Has never had one.    Obstetric History OB History  Gravida Para Term Preterm AB Living  0 0 0 0 0 0  SAB TAB Ectopic Multiple Live Births  0 0 0 0          Past Medical History:  Diagnosis Date  . Cancer [facility])    thyroid cancer treated with radioactive iodine  . Depression   . Diabetes mellitus without complication ([place])   . Epilepsy ([place])   . Glioblastoma determined by biopsy of brain ([place])   . Hyperlipidemia   . Hypertension   . Thyroid disease    h/o thyroid ca- 30 years ago  . Varicose vein     Family History  Problem Relation Age of Onset  . Arthritis Mother   . Hyperlipidemia Mother   . Hypertension Mother   . Heart disease Mother   . Diabetes Mother        type 2  . Thyroid disease Mother   . Cancer Father        colon  . Alcohol abuse Brother   . Hyperlipidemia Brother   . Hypertension Brother   . Breast cancer Neg Hx   . Ovarian cancer Neg Hx     Past Surgical History:  Procedure Laterality Date  .  APPLICATION OF CRANIAL NAVIGATION N/A 08/02/2015   Procedure: APPLICATION OF CRANIAL NAVIGATION;  Surgeon: Consuella Lose, MD;  Location: [place] NEURO ORS;  Service: Neurosurgery;  Laterality: N/A;  APPLICATION OF CRANIAL NAVIGATION  . BILATERAL CARPAL TUNNEL RELEASE  1990,1991   left hand in 1990 right hand in '91  . CERVICAL SPINE SURGERY  05/1997   spinal fusion  . CHOLECYSTECTOMY  04/24/2008   status post laparoscopic Dr.Ely  . CRANIOTOMY Right 08/02/2015   Procedure: CRANIOTOMY TUMOR EXCISION;  Surgeon: Consuella Lose, MD;  Location: [place] NEURO ORS;  Service: Neurosurgery;  Laterality: Right;  CRANIOTOMY TUMOR EXCISION  . radioactive iodine    . THYROIDECTOMY, PARTIAL  1964   due to cancerous tumor  . TOE SURGERY Left 1989    Social History   Social History  . Marital status: Single    Spouse name: N/A  . Number of children: N/A  . Years of education: N/A   Occupational History  . Not on file.   Social History Main Topics  . Smoking status: Former Research scientist (life sciences)  . Smokeless tobacco: Never Used     Comment: quit smoking in 2007  . Alcohol use No  . Drug use: No  . Sexual activity: No   Other Topics Concern  . Not on file   Social  History Narrative  . No narrative on file    Current Outpatient Prescriptions on File Prior to Visit  Medication Sig Dispense Refill  . acetaminophen (TYLENOL) 500 MG tablet Take 500 mg by mouth every 6 (six) hours as needed for mild pain or moderate pain.    Marland Kitchen allopurinol (ZYLOPRIM) 100 MG tablet Take 2 tablets (200 mg total) by mouth daily. 60 tablet 0  . colchicine (COLCRYS) 0.6 MG tablet Take 1 tablet (0.6 mg total) by mouth daily. 90 tablet 3  . glipiZIDE (GLUCOTROL) 5 MG tablet TAKE 1 TABLET BY MOUTH TWICE A DAY 30 MINUTES BEFORE A MEAL 60 tablet 3  . levETIRAcetam (KEPPRA) 500 MG tablet Take 1 tablet (500 mg total) by mouth 2 (two) times daily. Reported on 12/11/2015    . levothyroxine (SYNTHROID, LEVOTHROID) 100 MCG tablet TAKE 1 TABLET  (100 MCG TOTAL) BY MOUTH DAILY. 30 tablet 3  . lisinopril (PRINIVIL,ZESTRIL) 20 MG tablet Take 20 mg by mouth daily.  3  . metoCLOPramide (REGLAN) 10 MG tablet Take 1 tablet (10 mg total) by mouth every 6 (six) hours as needed for nausea. 12 tablet 0  . nortriptyline (PAMELOR) 10 MG capsule TAKE ONE TO THREE CAPSULES AT BEDTIME TO HELP WITH SLEEP AND NERVE PAIN 30 capsule 0  . pravastatin (PRAVACHOL) 40 MG tablet Take 40 mg by mouth daily.  1  . feeding supplement, ENSURE ENLIVE, (ENSURE ENLIVE) LIQD Take 237 mLs by mouth 3 (three) times daily between meals. (Patient not taking: Reported on 03/26/2017) 90 Bottle 0  . promethazine (PHENERGAN) 25 MG tablet Take 1 tablet (25 mg total) by mouth every 6 (six) hours as needed for nausea or vomiting. (Patient not taking: Reported on 03/26/2017) 30 tablet 0   No current facility-administered medications on file prior to visit.     Allergies  Allergen Reactions  . Ampicillin Other (See Comments)    Yeast rash   . Fish-Derived Products Other (See Comments)    gout  . Tomato Other (See Comments)    Upset stomach   . Penicillins Rash    .Has patient had a PCN reaction causing immediate rash, facial/tongue/throat swelling, SOB or lightheadedness with hypotension: Unknown Has patient had a PCN reaction causing severe rash involving mucus membranes or skin necrosis: Unknown Has patient had a PCN reaction that required hospitalization: Unknown Has patient had a PCN reaction occurring within the last 10 years: Unknown If all of the above answers are "NO", then may proceed with Cephalosporin use.       Review of Systems ROS Review of Systems - General ROS: negative for - chills, fatigue, fever, hot flashes, night sweats, weight gain or weight loss Psychological ROS: negative for - anxiety, decreased libido, depression, mood swings, physical abuse or sexual abuse Ophthalmic ROS: negative for - blurry vision, eye pain or loss of vision ENT ROS: negative  for - headaches, hearing change, visual changes or vocal changes Allergy and Immunology ROS: negative for - hives, itchy/watery eyes or seasonal allergies Hematological and Lymphatic ROS: negative for - bleeding problems, bruising, swollen lymph nodes or weight loss Endocrine ROS: negative for - galactorrhea, hair pattern changes, hot flashes, malaise/lethargy, mood swings, palpitations, polydipsia/polyuria, skin changes, temperature intolerance or unexpected weight changes Breast ROS: negative for - new or changing breast lumps or nipple discharge Respiratory ROS: negative for - cough or shortness of breath Cardiovascular ROS: negative for - chest pain, irregular heartbeat, palpitations or shortness of breath Gastrointestinal ROS: no abdominal pain, change  in bowel habits, or black or bloody stools Genito-Urinary ROS: no dysuria, trouble voiding, or hematuria Musculoskeletal ROS: negative for - joint pain or joint stiffness Neurological ROS: negative for - bowel and bladder control changes Dermatological ROS: negative for rash and skin lesion changes   Objective:   BP (!) 153/87 (BP Location: Left Arm, Patient Position: Sitting, Cuff Size: Normal)   Pulse 93   Ht 5\' 1"  (1.549 m)   Wt 164 lb 12.8 oz (74.8 kg)   BMI 31.14 kg/m  CONSTITUTIONAL: Well-developed, well-nourished female in no acute distress.  PSYCHIATRIC: Normal mood and affect. Normal behavior. Normal judgment and thought content. [place]: Alert and oriented to person, place, and time. Normal muscle tone coordination. No cranial nerve deficit noted. HENT:  Normocephalic, atraumatic, External right and left ear normal. Oropharynx is clear and moist EYES: Conjunctivae and EOM are normal. Pupils are equal, round, and reactive to light. No scleral icterus.  NECK: Normal range of motion, supple, no masses.  Normal thyroid.  SKIN: Skin is warm and dry. No rash noted. Not diaphoretic. No erythema. No pallor. CARDIOVASCULAR: Normal  heart rate noted, regular rhythm, no murmur. RESPIRATORY: Clear to auscultation bilaterally. Effort and breath sounds normal, no problems with respiration noted. BREASTS: Symmetric in size. No masses, skin changes, nipple drainage, or lymphadenopathy. ABDOMEN: Soft, normal bowel sounds, no distention noted.  No tenderness, rebound or guarding.  BLADDER: Normal PELVIC:  Bladder no bladder distension noted  Urethra: normal appearing urethra with no masses, tenderness or lesions  Vulva: normal appearing vulva with no masses, tenderness or lesions  Vagina: atrophic and narrow introitus  Cervix: normal appearing cervix without discharge or lesions and nulliparous os  Uterus: uterus is normal size, shape, consistency and nontender  Adnexa: normal adnexa in size, nontender and no masses  RV: External Exam NormaI, No Rectal Masses and Normal Sphincter tone  MUSCULOSKELETAL: Normal range of motion. No tenderness.  No cyanosis, clubbing, or edema.  2+ distal pulses. LYMPHATIC: No Axillary, Supraclavicular, or Inguinal Adenopathy.    Labs:  Lab Results  Component Value Date   WBC 8.5 03/08/2017   HGB 13.6 03/08/2017   HCT 38.0 03/08/2017   MCV 87.3 03/08/2017   PLT 269 03/08/2017    Lab Results  Component Value Date   CREATININE 1.07 (H) 03/08/2017   BUN 18 03/08/2017   NA 136 03/08/2017   K 3.6 03/08/2017   CL 104 03/08/2017   CO2 23 03/08/2017    Lab Results  Component Value Date   ALT 55 (H) 03/08/2017   AST 44 (H) 03/08/2017   ALKPHOS 112 03/08/2017   BILITOT 0.9 03/08/2017    Lab Results  Component Value Date   TSH 3.400 03/03/2017    Lab Results  Component Value Date   CHOL 201 (H) 03/25/2016   HDL 44 03/25/2016   LDLCALC Comment 03/25/2016   TRIG 405 (H) 03/25/2016   CHOLHDL 4.6 (H) 03/25/2016     Assessment:   Annual gynecologic examination 71 y.o. Contraception: post menopausal status Overweight H/o DM Elevated lipids  Plan:  Pap: performed today.   Not needed after today if returns normal, based on age.  Mammogram: Ordered.  Dexa Scan: ordered, however based on insurance does not appear that it will pay.  Patient currently declines due to insurance.  Stool Guaiac Testing:  Not performed.  Patient has had colonoscopy within past 3 years.  Labs: Has appt with PCP in 1 week.  Will have labs drawn then.  Routine preventative health maintenance measures emphasized: Exercise/Diet/Weight control and Tobacco Warnings (former smoker) H/o DM and elevated lipids, has follow up appointment with PCP.  Return to [place], MD [facility]

## 2017-03-30 NOTE — Patient Instructions (Signed)
Health Maintenance for Postmenopausal Women Menopause is a normal process in which your reproductive ability comes to an end. This process happens gradually over a span of months to years, usually between the ages of 22 and 9. Menopause is complete when you have missed 12 consecutive menstrual periods. It is important to talk with your health care provider about some of the most common conditions that affect postmenopausal women, such as heart disease, cancer, and bone loss (osteoporosis). Adopting a healthy lifestyle and getting preventive care can help to promote your health and wellness. Those actions can also lower your chances of developing some of these common conditions. What should I know about menopause? During menopause, you may experience a number of symptoms, such as:  Moderate-to-severe hot flashes.  Night sweats.  Decrease in sex drive.  Mood swings.  Headaches.  Tiredness.  Irritability.  Memory problems.  Insomnia.  Choosing to treat or not to treat menopausal changes is an individual decision that you make with your health care provider. What should I know about hormone replacement therapy and supplements? Hormone therapy products are effective for treating symptoms that are associated with menopause, such as hot flashes and night sweats. Hormone replacement carries certain risks, especially as you become older. If you are thinking about using estrogen or estrogen with progestin treatments, discuss the benefits and risks with your health care provider. What should I know about heart disease and stroke? Heart disease, heart attack, and stroke become more likely as you age. This may be due, in part, to the hormonal changes that your body experiences during menopause. These can affect how your body processes dietary fats, triglycerides, and cholesterol. Heart attack and stroke are both medical emergencies. There are many things that you can do to help prevent heart disease  and stroke:  Have your blood pressure checked at least every 1-2 years. High blood pressure causes heart disease and increases the risk of stroke.  If you are 53-22 years old, ask your health care provider if you should take aspirin to prevent a heart attack or a stroke.  Do not use any tobacco products, including cigarettes, chewing tobacco, or electronic cigarettes. If you need help quitting, ask your health care provider.  It is important to eat a healthy diet and maintain a healthy weight. ? Be sure to include plenty of vegetables, fruits, low-fat dairy products, and lean protein. ? Avoid eating foods that are high in solid fats, added sugars, or salt (sodium).  Get regular exercise. This is one of the most important things that you can do for your health. ? Try to exercise for at least 150 minutes each week. The type of exercise that you do should increase your heart rate and make you sweat. This is known as moderate-intensity exercise. ? Try to do strengthening exercises at least twice each week. Do these in addition to the moderate-intensity exercise.  Know your numbers.Ask your health care provider to check your cholesterol and your blood glucose. Continue to have your blood tested as directed by your health care provider.  What should I know about cancer screening? There are several types of cancer. Take the following steps to reduce your risk and to catch any cancer development as early as possible. Breast Cancer  Practice breast self-awareness. ? This means understanding how your breasts normally appear and feel. ? It also means doing regular breast self-exams. Let your health care provider know about any changes, no matter how small.  If you are 40  or older, have a clinician do a breast exam (clinical breast exam or CBE) every year. Depending on your age, family history, and medical history, it may be recommended that you also have a yearly breast X-ray (mammogram).  If you  have a family history of breast cancer, talk with your health care provider about genetic screening.  If you are at high risk for breast cancer, talk with your health care provider about having an MRI and a mammogram every year.  Breast cancer (BRCA) gene test is recommended for women who have family members with BRCA-related cancers. Results of the assessment will determine the need for genetic counseling and BRCA1 and for BRCA2 testing. BRCA-related cancers include these types: ? Breast. This occurs in males or females. ? Ovarian. ? Tubal. This may also be called fallopian tube cancer. ? Cancer of the abdominal or pelvic lining (peritoneal cancer). ? Prostate. ? Pancreatic.  Cervical, Uterine, and Ovarian Cancer Your health care provider may recommend that you be screened regularly for cancer of the pelvic organs. These include your ovaries, uterus, and vagina. This screening involves a pelvic exam, which includes checking for microscopic changes to the surface of your cervix (Pap test).  For women ages 21-65, health care providers may recommend a pelvic exam and a Pap test every three years. For women ages 79-65, they may recommend the Pap test and pelvic exam, combined with testing for human papilloma virus (HPV), every five years. Some types of HPV increase your risk of cervical cancer. Testing for HPV may also be done on women of any age who have unclear Pap test results.  Other health care providers may not recommend any screening for nonpregnant women who are considered low risk for pelvic cancer and have no symptoms. Ask your health care provider if a screening pelvic exam is right for you.  If you have had past treatment for cervical cancer or a condition that could lead to cancer, you need Pap tests and screening for cancer for at least 20 years after your treatment. If Pap tests have been discontinued for you, your risk factors (such as having a new sexual partner) need to be  reassessed to determine if you should start having screenings again. Some women have medical problems that increase the chance of getting cervical cancer. In these cases, your health care provider may recommend that you have screening and Pap tests more often.  If you have a family history of uterine cancer or ovarian cancer, talk with your health care provider about genetic screening.  If you have vaginal bleeding after reaching menopause, tell your health care provider.  There are currently no reliable tests available to screen for ovarian cancer.  Lung Cancer Lung cancer screening is recommended for adults 69-62 years old who are at high risk for lung cancer because of a history of smoking. A yearly low-dose CT scan of the lungs is recommended if you:  Currently smoke.  Have a history of at least 30 pack-years of smoking and you currently smoke or have quit within the past 15 years. A pack-year is smoking an average of one pack of cigarettes per day for one year.  Yearly screening should:  Continue until it has been 15 years since you quit.  Stop if you develop a health problem that would prevent you from having lung cancer treatment.  Colorectal Cancer  This type of cancer can be detected and can often be prevented.  Routine colorectal cancer screening usually begins at  age 42 and continues through age 45.  If you have risk factors for colon cancer, your health care provider may recommend that you be screened at an earlier age.  If you have a family history of colorectal cancer, talk with your health care provider about genetic screening.  Your health care provider may also recommend using home test kits to check for hidden blood in your stool.  A small camera at the end of a tube can be used to examine your colon directly (sigmoidoscopy or colonoscopy). This is done to check for the earliest forms of colorectal cancer.  Direct examination of the colon should be repeated every  5-10 years until age 71. However, if early forms of precancerous polyps or small growths are found or if you have a family history or genetic risk for colorectal cancer, you may need to be screened more often.  Skin Cancer  Check your skin from head to toe regularly.  Monitor any moles. Be sure to tell your health care provider: ? About any new moles or changes in moles, especially if there is a change in a mole's shape or color. ? If you have a mole that is larger than the size of a pencil eraser.  If any of your family members has a history of skin cancer, especially at a young age, talk with your health care provider about genetic screening.  Always use sunscreen. Apply sunscreen liberally and repeatedly throughout the day.  Whenever you are outside, protect yourself by wearing long sleeves, pants, a wide-brimmed hat, and sunglasses.  What should I know about osteoporosis? Osteoporosis is a condition in which bone destruction happens more quickly than new bone creation. After menopause, you may be at an increased risk for osteoporosis. To help prevent osteoporosis or the bone fractures that can happen because of osteoporosis, the following is recommended:  If you are 46-71 years old, get at least 1,000 mg of calcium and at least 600 mg of vitamin D per day.  If you are older than age 55 but younger than age 65, get at least 1,200 mg of calcium and at least 600 mg of vitamin D per day.  If you are older than age 54, get at least 1,200 mg of calcium and at least 800 mg of vitamin D per day.  Smoking and excessive alcohol intake increase the risk of osteoporosis. Eat foods that are rich in calcium and vitamin D, and do weight-bearing exercises several times each week as directed by your health care provider. What should I know about how menopause affects my mental health? Depression may occur at any age, but it is more common as you become older. Common symptoms of depression  include:  Low or sad mood.  Changes in sleep patterns.  Changes in appetite or eating patterns.  Feeling an overall lack of motivation or enjoyment of activities that you previously enjoyed.  Frequent crying spells.  Talk with your health care provider if you think that you are experiencing depression. What should I know about immunizations? It is important that you get and maintain your immunizations. These include:  Tetanus, diphtheria, and pertussis (Tdap) booster vaccine.  Influenza every year before the flu season begins.  Pneumonia vaccine.  Shingles vaccine.  Your health care provider may also recommend other immunizations. This information is not intended to replace advice given to you by your health care provider. Make sure you discuss any questions you have with your health care provider. Document Released: 09/18/2005  Document Revised: 02/14/2016 Document Reviewed: 04/30/2015 Elsevier Interactive Patient Education  2018 [employer] Inc[employer]  

## 2017-03-31 ENCOUNTER — Telehealth: Payer: Self-pay | Admitting: Family Medicine

## 2017-03-31 ENCOUNTER — Other Ambulatory Visit: Payer: Self-pay | Admitting: Family Medicine

## 2017-03-31 DIAGNOSIS — E119 Type 2 diabetes mellitus without complications: Secondary | ICD-10-CM | POA: Diagnosis not present

## 2017-03-31 DIAGNOSIS — R262 Difficulty in walking, not elsewhere classified: Secondary | ICD-10-CM | POA: Diagnosis not present

## 2017-03-31 DIAGNOSIS — G40909 Epilepsy, unspecified, not intractable, without status epilepticus: Secondary | ICD-10-CM | POA: Diagnosis not present

## 2017-03-31 DIAGNOSIS — M6281 Muscle weakness (generalized): Secondary | ICD-10-CM | POA: Diagnosis not present

## 2017-03-31 DIAGNOSIS — C719 Malignant neoplasm of brain, unspecified: Secondary | ICD-10-CM | POA: Diagnosis not present

## 2017-03-31 DIAGNOSIS — I10 Essential (primary) hypertension: Secondary | ICD-10-CM | POA: Diagnosis not present

## 2017-03-31 MED ORDER — AMLODIPINE BESYLATE 5 MG PO TABS: 5.0000 mg | ORAL_TABLET | Freq: Every day | ORAL | 1 refills | Status: DC

## 2017-03-31 NOTE — Telephone Encounter (Signed)
Izora Gala with [facility] is returning call.  PB#[phone]/[id number]

## 2017-03-31 NOTE — Telephone Encounter (Signed)
Martha [name] with [facility] advised pt blood pressure at 9:20 in left arm while resting was 180/100.  She took the blood pressure 30 minutes later and was 160/90.  Pt is taking lisinopril (PRINIVIL,ZESTRIL) 20 MG tablet only.  Delphine is asking if pt will need to do anything different for blood pressure medication.       Beauty states a Rx for Potassium is listed for pt to be taking but pt does not has a Rx for this and there is no medication in the home.  TG#[phone]/[id number]

## 2017-03-31 NOTE — Telephone Encounter (Signed)
Martha [name] with [facility] advised.

## 2017-03-31 NOTE — Telephone Encounter (Signed)
I have sent in amlodipine to her pharmacy. Continue Lisinopril. Does not need to be taking potassium at this time.

## 2017-03-31 NOTE — Telephone Encounter (Signed)
Left message for Martha [name] with [facility] to return call.

## 2017-04-01 ENCOUNTER — Other Ambulatory Visit: Payer: Medicare Other

## 2017-04-01 ENCOUNTER — Ambulatory Visit: Payer: Medicare Other | Admitting: Internal Medicine

## 2017-04-01 ENCOUNTER — Ambulatory Visit: Payer: Medicare Other

## 2017-04-01 LAB — PAP IG AND HPV HIGH-RISK
HPV, high-risk: NEGATIVE
PAP Smear Comment: 0

## 2017-04-02 ENCOUNTER — Telehealth: Payer: Self-pay

## 2017-04-02 DIAGNOSIS — I10 Essential (primary) hypertension: Secondary | ICD-10-CM | POA: Diagnosis not present

## 2017-04-02 DIAGNOSIS — M6281 Muscle weakness (generalized): Secondary | ICD-10-CM | POA: Diagnosis not present

## 2017-04-02 DIAGNOSIS — R262 Difficulty in walking, not elsewhere classified: Secondary | ICD-10-CM | POA: Diagnosis not present

## 2017-04-02 DIAGNOSIS — E119 Type 2 diabetes mellitus without complications: Secondary | ICD-10-CM | POA: Diagnosis not present

## 2017-04-02 DIAGNOSIS — C719 Malignant neoplasm of brain, unspecified: Secondary | ICD-10-CM | POA: Diagnosis not present

## 2017-04-02 DIAGNOSIS — G40909 Epilepsy, unspecified, not intractable, without status epilepticus: Secondary | ICD-10-CM | POA: Diagnosis not present

## 2017-04-02 NOTE — Telephone Encounter (Signed)
Martha [name], physical therapist, called and states she needs a verbal order to make up physical therapy visit that was missed this week. CB is [phone]

## 2017-04-02 NOTE — Telephone Encounter (Signed)
Please give verbal order.

## 2017-04-02 NOTE — Telephone Encounter (Signed)
Martha [name] advised. Martha [name] also wanted to just let you know that she checked patient's b/p and it was 150/110 not long ago but patient has not taking her medication yet, so patient took her medication and right before I called her b/p went down already to 148/90. Her resting heart rate has not changed its 105, they will not be doing physical therapy exercises today due to this.-aa

## 2017-04-05 DIAGNOSIS — M6281 Muscle weakness (generalized): Secondary | ICD-10-CM | POA: Diagnosis not present

## 2017-04-05 DIAGNOSIS — G40909 Epilepsy, unspecified, not intractable, without status epilepticus: Secondary | ICD-10-CM | POA: Diagnosis not present

## 2017-04-05 DIAGNOSIS — C719 Malignant neoplasm of brain, unspecified: Secondary | ICD-10-CM | POA: Diagnosis not present

## 2017-04-05 DIAGNOSIS — E119 Type 2 diabetes mellitus without complications: Secondary | ICD-10-CM | POA: Diagnosis not present

## 2017-04-05 DIAGNOSIS — I10 Essential (primary) hypertension: Secondary | ICD-10-CM | POA: Diagnosis not present

## 2017-04-05 DIAGNOSIS — R262 Difficulty in walking, not elsewhere classified: Secondary | ICD-10-CM | POA: Diagnosis not present

## 2017-04-06 ENCOUNTER — Telehealth: Payer: Self-pay

## 2017-04-06 DIAGNOSIS — E119 Type 2 diabetes mellitus without complications: Secondary | ICD-10-CM | POA: Diagnosis not present

## 2017-04-06 DIAGNOSIS — C719 Malignant neoplasm of brain, unspecified: Secondary | ICD-10-CM | POA: Diagnosis not present

## 2017-04-06 DIAGNOSIS — I10 Essential (primary) hypertension: Secondary | ICD-10-CM | POA: Diagnosis not present

## 2017-04-06 DIAGNOSIS — G40909 Epilepsy, unspecified, not intractable, without status epilepticus: Secondary | ICD-10-CM | POA: Diagnosis not present

## 2017-04-06 DIAGNOSIS — M6281 Muscle weakness (generalized): Secondary | ICD-10-CM | POA: Diagnosis not present

## 2017-04-06 DIAGNOSIS — R262 Difficulty in walking, not elsewhere classified: Secondary | ICD-10-CM | POA: Diagnosis not present

## 2017-04-06 NOTE — Telephone Encounter (Signed)
-----   Message from Rubie Maid, MD sent at 04/02/2017  2:35 PM EDT ----- Please inform of normal pap smear.

## 2017-04-06 NOTE — Telephone Encounter (Signed)
Called pt informed her of normal PAP. Pt gave verbal understanding.

## 2017-04-08 ENCOUNTER — Telehealth: Payer: Self-pay | Admitting: Family Medicine

## 2017-04-08 NOTE — Telephone Encounter (Signed)
Give home care the ok on occupational therapy. Have her bring the medications she is currently taking to  her appointment with me tomorrow.

## 2017-04-08 NOTE — Telephone Encounter (Signed)
Spoke with [place] Izora Gala) and approved OT, per her request she asked for approval for social worker to come out to home to evaluate patient since she has had compliance issues and has no help at home. Per Mikki Santee okay to approve social worker coming out to home. Left detailed message for patient on her home voicemail reminding her to bring all prescriptions. KW

## 2017-04-08 NOTE — Telephone Encounter (Signed)
[facility] called needing OT for 1 time a week for 3 weeks.   They also want to switch her pharmacy from [facility] to [facility].  They will need new prescriptions sent to [facility].  She needs bubble packs/  Her call back is [phone]  Thanks [employer]

## 2017-04-08 NOTE — Telephone Encounter (Signed)
Nurse Shateka returned Martha [name]'s call.  Please call her back.  Thanks [employer]

## 2017-04-08 NOTE — Telephone Encounter (Signed)
[id number] 

## 2017-04-08 NOTE — Telephone Encounter (Signed)
Please review. KW 

## 2017-04-09 ENCOUNTER — Ambulatory Visit (INDEPENDENT_AMBULATORY_CARE_PROVIDER_SITE_OTHER): Payer: Medicare Other | Admitting: Family Medicine

## 2017-04-09 ENCOUNTER — Encounter: Payer: Self-pay | Admitting: Family Medicine

## 2017-04-09 VITALS — BP 140/100 | HR 85 | Temp 98.0°F | Resp 16 | Wt 159.2 lb

## 2017-04-09 DIAGNOSIS — I10 Essential (primary) hypertension: Secondary | ICD-10-CM | POA: Diagnosis not present

## 2017-04-09 DIAGNOSIS — N183 Chronic kidney disease, stage 3 unspecified: Secondary | ICD-10-CM

## 2017-04-09 DIAGNOSIS — E1122 Type 2 diabetes mellitus with diabetic chronic kidney disease: Secondary | ICD-10-CM | POA: Diagnosis not present

## 2017-04-09 DIAGNOSIS — M1000 Idiopathic gout, unspecified site: Secondary | ICD-10-CM | POA: Diagnosis not present

## 2017-04-09 DIAGNOSIS — E890 Postprocedural hypothyroidism: Secondary | ICD-10-CM

## 2017-04-09 DIAGNOSIS — C719 Malignant neoplasm of brain, unspecified: Secondary | ICD-10-CM | POA: Diagnosis not present

## 2017-04-09 DIAGNOSIS — G40909 Epilepsy, unspecified, not intractable, without status epilepticus: Secondary | ICD-10-CM

## 2017-04-09 LAB — POCT GLYCOSYLATED HEMOGLOBIN (HGB A1C): HEMOGLOBIN A1C: 8.6

## 2017-04-09 MED ORDER — GLIPIZIDE 5 MG PO TABS: ORAL_TABLET | ORAL | 3 refills | Status: DC

## 2017-04-09 MED ORDER — ALLOPURINOL 100 MG PO TABS: 200.0000 mg | ORAL_TABLET | Freq: Every day | ORAL | 3 refills | Status: DC

## 2017-04-09 MED ORDER — AMLODIPINE BESYLATE 5 MG PO TABS: 5.0000 mg | ORAL_TABLET | Freq: Every day | ORAL | 3 refills | Status: DC

## 2017-04-09 MED ORDER — LEVOTHYROXINE SODIUM 100 MCG PO TABS: 100.0000 ug | ORAL_TABLET | Freq: Every day | ORAL | 3 refills | Status: DC

## 2017-04-09 MED ORDER — LEVETIRACETAM 500 MG PO TABS: 500.0000 mg | ORAL_TABLET | Freq: Two times a day (BID) | ORAL | 3 refills | Status: AC

## 2017-04-09 MED ORDER — COLCHICINE 0.6 MG PO TABS: 0.6000 mg | ORAL_TABLET | Freq: Every day | ORAL | 3 refills | Status: DC

## 2017-04-09 MED ORDER — LISINOPRIL 10 MG PO TABS: 10.0000 mg | ORAL_TABLET | Freq: Every day | ORAL | 3 refills | Status: DC

## 2017-04-09 NOTE — Progress Notes (Signed)
Subjective:     Patient ID: Martha [name], female   DOB: 14-Dec-1945, 71 y.o.   MRN: [id number]  HPI  Chief Complaint  Patient presents with  . Diabetes    Patient comes in office today for 3 month follow up, last office visit was 01/07/17 HgbA1C in house was 8.2%. Patient was advised last visit to resume Glipizide before largest meal, she denies any hypoglycemia incidents since last visit and reports good compliance and tolerance on medication  . Hypertension    Follow up from 01/07/17 patients condition was stable and blood pressure in house was 122/80. Patient reports good compliance and tolerance on medication.   She is currently receiving full home care evaluation and found to be forgetful about medication. Have requested change to blister packs for dosing. She accepts their help but values her independence. On review of her med list she does not think she has been taking glipizide. Both pravastatin and lisinopril were discontinued after hospitalization in July for AKI and dehydration. Also has reported to me myalgias in the past.   Review of Systems     Objective:   Physical Exam  Constitutional: She appears well-developed and well-nourished. No distress (s.p. cane for ambulation).  Cardiovascular: Normal rate and regular rhythm.   Pulmonary/Chest: Breath sounds normal.  Musculoskeletal: She exhibits no edema (of lower extremities).       Assessment:    1. Seizure disorder (HCC) - levETIRAcetam (KEPPRA) 500 MG tablet; Take 1 tablet (500 mg total) by mouth 2 (two) times daily. Reported on 12/11/2015  Dispense: 180 tablet; Refill: 3  2. 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 Type 2 diabetes mellitus with stage 3 chronic kidney disease, without long-term current use of insulin (HCC) - glipiZIDE (GLUCOTROL) 5 MG tablet; TAKE 1 TABLET BY MOUTH TWICE A DAY 30 MINUTES BEFORE A MEAL  Dispense: 60 tablet; Refill: 3 - POCT glycosylated hemoglobin (Hb A1C)  4. Idiopathic  gout, unspecified chronicity, unspecified site - Uric acid - colchicine (COLCRYS) 0.6 MG tablet; Take 1 tablet (0.6 mg total) by mouth daily.  Dispense: 90 tablet; Refill: 3 - allopurinol (ZYLOPRIM) 100 MG tablet; Take 2 tablets (200 mg total) by mouth daily.  Dispense: 180 tablet; Refill: 3  5. Essential hypertension: will resume lisinopril at lower dose  - amLODipine (NORVASC) 5 MG tablet; Take 1 tablet (5 mg total) by mouth daily.  Dispense: 90 tablet; Refill: 3 - lisinopril (PRINIVIL,ZESTRIL) 10 MG tablet; Take 1 tablet (10 mg total) by mouth daily.  Dispense: 90 tablet; Refill: 3  6. Postoperative hypothyroidism - levothyroxine (SYNTHROID, LEVOTHROID) 100 MCG tablet; Take 1 tablet (100 mcg total) by mouth daily.  Dispense: 90 tablet; Refill: 3      Plan:    F/u pending lab work and in 3 weeks. She has outstanding lab orders for oncology as well. Have requested blister packs and home delivery for all refilled medications.

## 2017-04-09 NOTE — Patient Instructions (Signed)
Please get the lab drawn for uric acid (there may be also lab orders from your oncologist). We will call you with the results.

## 2017-04-12 DIAGNOSIS — C719 Malignant neoplasm of brain, unspecified: Secondary | ICD-10-CM | POA: Diagnosis not present

## 2017-04-12 DIAGNOSIS — R262 Difficulty in walking, not elsewhere classified: Secondary | ICD-10-CM | POA: Diagnosis not present

## 2017-04-12 DIAGNOSIS — G40909 Epilepsy, unspecified, not intractable, without status epilepticus: Secondary | ICD-10-CM | POA: Diagnosis not present

## 2017-04-12 DIAGNOSIS — M6281 Muscle weakness (generalized): Secondary | ICD-10-CM | POA: Diagnosis not present

## 2017-04-12 DIAGNOSIS — I10 Essential (primary) hypertension: Secondary | ICD-10-CM | POA: Diagnosis not present

## 2017-04-12 DIAGNOSIS — E119 Type 2 diabetes mellitus without complications: Secondary | ICD-10-CM | POA: Diagnosis not present

## 2017-04-13 DIAGNOSIS — E119 Type 2 diabetes mellitus without complications: Secondary | ICD-10-CM | POA: Diagnosis not present

## 2017-04-13 DIAGNOSIS — I10 Essential (primary) hypertension: Secondary | ICD-10-CM | POA: Diagnosis not present

## 2017-04-13 DIAGNOSIS — R262 Difficulty in walking, not elsewhere classified: Secondary | ICD-10-CM | POA: Diagnosis not present

## 2017-04-13 DIAGNOSIS — C719 Malignant neoplasm of brain, unspecified: Secondary | ICD-10-CM | POA: Diagnosis not present

## 2017-04-13 DIAGNOSIS — G40909 Epilepsy, unspecified, not intractable, without status epilepticus: Secondary | ICD-10-CM | POA: Diagnosis not present

## 2017-04-13 DIAGNOSIS — M6281 Muscle weakness (generalized): Secondary | ICD-10-CM | POA: Diagnosis not present

## 2017-04-14 DIAGNOSIS — G40909 Epilepsy, unspecified, not intractable, without status epilepticus: Secondary | ICD-10-CM | POA: Diagnosis not present

## 2017-04-14 DIAGNOSIS — I10 Essential (primary) hypertension: Secondary | ICD-10-CM | POA: Diagnosis not present

## 2017-04-14 DIAGNOSIS — C719 Malignant neoplasm of brain, unspecified: Secondary | ICD-10-CM | POA: Diagnosis not present

## 2017-04-14 DIAGNOSIS — M6281 Muscle weakness (generalized): Secondary | ICD-10-CM | POA: Diagnosis not present

## 2017-04-14 DIAGNOSIS — R262 Difficulty in walking, not elsewhere classified: Secondary | ICD-10-CM | POA: Diagnosis not present

## 2017-04-14 DIAGNOSIS — E119 Type 2 diabetes mellitus without complications: Secondary | ICD-10-CM | POA: Diagnosis not present

## 2017-04-15 ENCOUNTER — Other Ambulatory Visit: Payer: Self-pay | Admitting: Family Medicine

## 2017-04-20 ENCOUNTER — Telehealth: Payer: Self-pay

## 2017-04-20 DIAGNOSIS — M6281 Muscle weakness (generalized): Secondary | ICD-10-CM | POA: Diagnosis not present

## 2017-04-20 DIAGNOSIS — E119 Type 2 diabetes mellitus without complications: Secondary | ICD-10-CM | POA: Diagnosis not present

## 2017-04-20 DIAGNOSIS — C719 Malignant neoplasm of brain, unspecified: Secondary | ICD-10-CM | POA: Diagnosis not present

## 2017-04-20 DIAGNOSIS — I10 Essential (primary) hypertension: Secondary | ICD-10-CM | POA: Diagnosis not present

## 2017-04-20 DIAGNOSIS — R262 Difficulty in walking, not elsewhere classified: Secondary | ICD-10-CM | POA: Diagnosis not present

## 2017-04-20 DIAGNOSIS — G40909 Epilepsy, unspecified, not intractable, without status epilepticus: Secondary | ICD-10-CM | POA: Diagnosis not present

## 2017-04-20 NOTE — Telephone Encounter (Signed)
Have her come in for a nurse bp check today if she can get a ride.

## 2017-04-20 NOTE — Telephone Encounter (Signed)
Left a message for Jacayla to call back. [facility] as well, but phone line was disconnected. Unable to contact her when I called back.

## 2017-04-20 NOTE — Telephone Encounter (Signed)
Izora Gala, an OT from [facility] called concerned about the patients BP. She reports that she is having a hard time recording her BP. She feels that her BP may be too low. She reports that her pulse is anywhere between 85-105.   She also mentions that the patient may have taken a whole blister pack of Lisinopril that was delivered to her house on 04/14/2017. She received the allopurinol as well, but she has not taken any of those tablets. She reports that the patient is a poor historian, and she can not tell her which medications she is taking. She looked all around her house for Lisinopril and she could not find any of the pills. She also reports that she is not taking her thyroid medication either.   She reports that the patient denies any shortness of breath, dizziness, chest pain, blurred vision, or confusion. Would you like the patient to come in the office for evaluation? Izora Gala (the OT) reports that we can call the patient's caregiver [name] at [phone].

## 2017-04-22 DIAGNOSIS — E119 Type 2 diabetes mellitus without complications: Secondary | ICD-10-CM | POA: Diagnosis not present

## 2017-04-22 DIAGNOSIS — G40909 Epilepsy, unspecified, not intractable, without status epilepticus: Secondary | ICD-10-CM | POA: Diagnosis not present

## 2017-04-22 DIAGNOSIS — M6281 Muscle weakness (generalized): Secondary | ICD-10-CM | POA: Diagnosis not present

## 2017-04-22 DIAGNOSIS — C719 Malignant neoplasm of brain, unspecified: Secondary | ICD-10-CM | POA: Diagnosis not present

## 2017-04-22 DIAGNOSIS — I10 Essential (primary) hypertension: Secondary | ICD-10-CM | POA: Diagnosis not present

## 2017-04-22 DIAGNOSIS — R262 Difficulty in walking, not elsewhere classified: Secondary | ICD-10-CM | POA: Diagnosis not present

## 2017-04-23 ENCOUNTER — Inpatient Hospital Stay: Payer: Medicare Other | Attending: Internal Medicine

## 2017-04-23 ENCOUNTER — Inpatient Hospital Stay (HOSPITAL_BASED_OUTPATIENT_CLINIC_OR_DEPARTMENT_OTHER): Payer: Medicare Other | Admitting: Internal Medicine

## 2017-04-23 VITALS — BP 105/74 | HR 106 | Temp 97.8°F | Resp 20 | Ht 61.0 in | Wt 157.4 lb

## 2017-04-23 DIAGNOSIS — G40909 Epilepsy, unspecified, not intractable, without status epilepticus: Secondary | ICD-10-CM | POA: Insufficient documentation

## 2017-04-23 DIAGNOSIS — Z87891 Personal history of nicotine dependence: Secondary | ICD-10-CM | POA: Diagnosis not present

## 2017-04-23 DIAGNOSIS — T380X5A Adverse effect of glucocorticoids and synthetic analogues, initial encounter: Secondary | ICD-10-CM | POA: Insufficient documentation

## 2017-04-23 DIAGNOSIS — R110 Nausea: Secondary | ICD-10-CM | POA: Insufficient documentation

## 2017-04-23 DIAGNOSIS — R634 Abnormal weight loss: Secondary | ICD-10-CM

## 2017-04-23 DIAGNOSIS — Z9221 Personal history of antineoplastic chemotherapy: Secondary | ICD-10-CM

## 2017-04-23 DIAGNOSIS — E2740 Unspecified adrenocortical insufficiency: Secondary | ICD-10-CM | POA: Diagnosis not present

## 2017-04-23 DIAGNOSIS — E119 Type 2 diabetes mellitus without complications: Secondary | ICD-10-CM | POA: Insufficient documentation

## 2017-04-23 DIAGNOSIS — F329 Major depressive disorder, single episode, unspecified: Secondary | ICD-10-CM | POA: Insufficient documentation

## 2017-04-23 DIAGNOSIS — E890 Postprocedural hypothyroidism: Secondary | ICD-10-CM | POA: Insufficient documentation

## 2017-04-23 DIAGNOSIS — Z79899 Other long term (current) drug therapy: Secondary | ICD-10-CM | POA: Diagnosis not present

## 2017-04-23 DIAGNOSIS — G720 Drug-induced myopathy: Secondary | ICD-10-CM | POA: Insufficient documentation

## 2017-04-23 DIAGNOSIS — E785 Hyperlipidemia, unspecified: Secondary | ICD-10-CM | POA: Diagnosis not present

## 2017-04-23 DIAGNOSIS — Z7984 Long term (current) use of oral hypoglycemic drugs: Secondary | ICD-10-CM | POA: Insufficient documentation

## 2017-04-23 DIAGNOSIS — Z85850 Personal history of malignant neoplasm of thyroid: Secondary | ICD-10-CM | POA: Insufficient documentation

## 2017-04-23 DIAGNOSIS — R413 Other amnesia: Secondary | ICD-10-CM | POA: Diagnosis not present

## 2017-04-23 DIAGNOSIS — I10 Essential (primary) hypertension: Secondary | ICD-10-CM | POA: Insufficient documentation

## 2017-04-23 DIAGNOSIS — C719 Malignant neoplasm of brain, unspecified: Secondary | ICD-10-CM | POA: Diagnosis not present

## 2017-04-23 DIAGNOSIS — Z923 Personal history of irradiation: Secondary | ICD-10-CM | POA: Insufficient documentation

## 2017-04-23 LAB — COMPREHENSIVE METABOLIC PANEL
ALBUMIN: 3.5 g/dL (ref 3.5–5.0)
ALK PHOS: 110 U/L (ref 38–126)
ALT: 25 U/L (ref 14–54)
AST: 18 U/L (ref 15–41)
Anion gap: 11 (ref 5–15)
BUN: 24 mg/dL — AB (ref 6–20)
CALCIUM: 9 mg/dL (ref 8.9–10.3)
CO2: 23 mmol/L (ref 22–32)
CREATININE: 1.27 mg/dL — AB (ref 0.44–1.00)
Chloride: 102 mmol/L (ref 101–111)
GFR calc Af Amer: 48 mL/min — ABNORMAL LOW (ref >60)
GFR calc non Af Amer: 42 mL/min — ABNORMAL LOW (ref >60)
GLUCOSE: 169 mg/dL — AB (ref 65–99)
Potassium: 3.1 mmol/L — ABNORMAL LOW (ref 3.5–5.1)
Sodium: 136 mmol/L (ref 135–145)
Total Bilirubin: 1.5 mg/dL — ABNORMAL HIGH (ref 0.3–1.2)
Total Protein: 6.6 g/dL (ref 6.5–8.1)

## 2017-04-23 LAB — CBC WITH DIFFERENTIAL/PLATELET
BASOS PCT: 1 %
Basophils Absolute: 0.1 10*3/uL (ref 0–0.1)
EOS ABS: 0.1 10*3/uL (ref 0–0.7)
Eosinophils Relative: 1 %
HCT: 37.8 % (ref 35.0–47.0)
HEMOGLOBIN: 13.2 g/dL (ref 12.0–16.0)
LYMPHS PCT: 12 %
Lymphs Abs: 1.1 10*3/uL (ref 1.0–3.6)
MCH: 31.5 pg (ref 26.0–34.0)
MCHC: 34.8 g/dL (ref 32.0–36.0)
MCV: 90.4 fL (ref 80.0–100.0)
Monocytes Absolute: 0.6 10*3/uL (ref 0.2–0.9)
Monocytes Relative: 6 %
Neutro Abs: 7.4 10*3/uL — ABNORMAL HIGH (ref 1.4–6.5)
Neutrophils Relative %: 80 %
Platelets: 271 10*3/uL (ref 150–440)
RBC: 4.18 MIL/uL (ref 3.80–5.20)
RDW: 13.3 % (ref 11.5–14.5)
WBC: 9.3 10*3/uL (ref 3.6–11.0)

## 2017-04-23 LAB — URINALYSIS, COMPLETE (UACMP) WITH MICROSCOPIC
BILIRUBIN URINE: NEGATIVE
GLUCOSE, UA: 150 mg/dL — AB
HGB URINE DIPSTICK: NEGATIVE
KETONES UR: NEGATIVE mg/dL
Leukocytes, UA: NEGATIVE
Nitrite: NEGATIVE
PROTEIN: NEGATIVE mg/dL
Specific Gravity, Urine: 1.006 (ref 1.005–1.030)
pH: 5 (ref 5.0–8.0)

## 2017-04-23 MED ORDER — PANTOPRAZOLE SODIUM 40 MG PO TBEC: 40.0000 mg | DELAYED_RELEASE_TABLET | Freq: Every day | ORAL | 3 refills | Status: DC

## 2017-04-23 MED ORDER — DEXAMETHASONE 1 MG PO TABS: ORAL_TABLET | ORAL | 3 refills | Status: DC

## 2017-04-23 NOTE — Assessment & Plan Note (Addendum)
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   # 03/09/2017- MRI shows- signal changes ? recurrence with thalamic area/tumor bed; also shows enhancement in the cerebellar region- after extensive evaluation- thought not to be consistent with his progression. Currently on surveillance. No obvious evidence of disease progression. MRI has been ordered today.  #Adrenal insufficiency- checked pharmacy unfortunately patient not on Steroid supplementation. Recommend starting dexamethasone 1 mg a day. New prescription sent.  # Epigastric discomfort question related to gastritis from steroids- recommend Protonix. Prescription sent.   # weight loss/ Memory issues/ coping issues-   # History of thyroid cancer- on Synthroid. Recent TSH normal. Continue Synthroid.   # Debility/steroid myopathy- agree with PT.   # follow up in  4 weeks; labs/MRI few days prior.

## 2017-04-23 NOTE — Progress Notes (Signed)
[place] OFFICE PROGRESS NOTE  Patient Care Team: Carmon Ginsberg, [place] as PCP - General (Family Medicine)  No matching staging information was found for the patient.   Oncology History   1. 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 (January, 2017; s/p resection Dr.Nundkumar; [place]) 2 starting radiation therapy and Temodar January of 2017 3.She has finished radiation therapy in April of 2017 and also finished Temodar for approximately 6 weeks 4.Patient started on maintenance Temodar from Jan 03, 2016; July 2017- MRI no recurrence; left hemisphere- whitish plaques ? 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    #Thyroid cancer s/p Thyroidectomy [s/p RAUI; 1967]     Glioblastoma determined by biopsy of brain ([place])   08/27/2015 Initial Diagnosis    Glioblastoma determined by biopsy of brain ([place])       Glioblastoma ([place]) (Resolved)    Initial Diagnosis    Glioblastoma ([place])        INTERVAL HISTORY:  Martha [name] 71 y.o.  female pleasant patient above history of GBM right frontal status post resection; Status post adjuvant Temodar 8 months [stopped in June 2018 sec to intol].  Patient also diagnosed with adrenal insufficiency- had noted significant improvement with steroids.  The last few days she has been feeling poorly; intermittent nausea without any vomiting. She has been more impatient/frustrated. Not wanting to cooperate with physical therapy. She is not sure if she is taking steroids.  Patient's frustrated; having memory problems. She does not want to interact with therapists/social worker coming home. She declines any depression. However having difficulty coping with diagnosis/prognosis.  Loss in weight. No nausea no vomiting no headaches. No abdominal pain. Today she is in a wheelchair.. Accompanied by family  friend.  REVIEW OF SYSTEMS:  A complete 10 point review of system is done which is negative except mentioned above/history of present illness.   PAST MEDICAL HISTORY :  Past Medical History:  Diagnosis Date  . Cancer [facility])    thyroid cancer treated with radioactive iodine  . Depression   . Diabetes mellitus without complication ([place])   . Epilepsy ([place])   . Glioblastoma determined by biopsy of brain ([place])   . Hyperlipidemia   . Hypertension   . Thyroid disease    h/o thyroid ca- 30 years ago  . Varicose vein     PAST SURGICAL HISTORY :   Past Surgical History:  Procedure Laterality Date  . APPLICATION OF CRANIAL NAVIGATION N/A 08/02/2015   Procedure: APPLICATION OF CRANIAL NAVIGATION;  Surgeon: Consuella Lose, MD;  Location: [facility];  Service: Neurosurgery;  Laterality: N/A;  APPLICATION OF CRANIAL NAVIGATION  . BILATERAL CARPAL TUNNEL RELEASE  1990,1991   left hand in 1990 right hand in '91  . CERVICAL SPINE SURGERY  05/1997   spinal fusion  . CHOLECYSTECTOMY  04/24/2008   status post laparoscopic Dr.Ely  . CRANIOTOMY Right 08/02/2015   Procedure: CRANIOTOMY TUMOR EXCISION;  Surgeon: Consuella Lose, MD;  Location: [facility];  Service: Neurosurgery;  Laterality: Right;  CRANIOTOMY TUMOR EXCISION  . radioactive iodine    . THYROIDECTOMY, PARTIAL  1964   due to cancerous tumor  . TOE SURGERY Left 1989    FAMILY HISTORY :   Family History  Problem Relation Age of Onset  . Arthritis Mother   . Hyperlipidemia Mother   . Hypertension Mother   .  Heart disease Mother   . Diabetes Mother        type 2  . Thyroid disease Mother   . Cancer Father        colon  . Alcohol abuse Brother   . Hyperlipidemia Brother   . Hypertension Brother   . Breast cancer Neg Hx   . Ovarian cancer Neg Hx     SOCIAL HISTORY:   Social History  Substance Use Topics  . Smoking status: Former Research scientist (life sciences)  . Smokeless tobacco: Never Used     Comment: quit smoking in 2007  . Alcohol  use No    ALLERGIES:  is allergic to ampicillin; fish-derived products; pravastatin; tomato; and penicillins.  MEDICATIONS:  Current Outpatient Prescriptions  Medication Sig Dispense Refill  . allopurinol (ZYLOPRIM) 100 MG tablet Take 2 tablets (200 mg total) by mouth daily. 180 tablet 3  . amLODipine (NORVASC) 5 MG tablet Take 1 tablet (5 mg total) by mouth daily. 90 tablet 3  . feeding supplement, ENSURE ENLIVE, (ENSURE ENLIVE) LIQD Take 237 mLs by mouth 3 (three) times daily between meals. 90 Bottle 0  . glipiZIDE (GLUCOTROL) 5 MG tablet TAKE 1 TABLET BY MOUTH TWICE A DAY 30 MINUTES BEFORE A MEAL 60 tablet 3  . levETIRAcetam (KEPPRA) 500 MG tablet Take 1 tablet (500 mg total) by mouth 2 (two) times daily. Reported on 12/11/2015 180 tablet 3  . levothyroxine (SYNTHROID, LEVOTHROID) 100 MCG tablet Take 1 tablet (100 mcg total) by mouth daily. 90 tablet 3  . lisinopril (PRINIVIL,ZESTRIL) 10 MG tablet Take 1 tablet (10 mg total) by mouth daily. 90 tablet 3  . acetaminophen (TYLENOL) 500 MG tablet Take 500 mg by mouth every 6 (six) hours as needed for mild pain or moderate pain.    Marland Kitchen dexamethasone (DECADRON) 1 MG tablet Once a day. 30 tablet 3  . pantoprazole (PROTONIX) 40 MG tablet Take 1 tablet (40 mg total) by mouth daily. 30 tablet 3  . promethazine (PHENERGAN) 25 MG tablet Take 1 tablet (25 mg total) by mouth every 6 (six) hours as needed for nausea or vomiting. (Patient not taking: Reported on 04/23/2017) 30 tablet 0   No current facility-administered medications for this visit.     PHYSICAL EXAMINATION: ECOG PERFORMANCE STATUS: 1 - Symptomatic but completely ambulatory  BP 105/74 (Patient Position: Sitting)   Pulse (!) 106   Temp 97.8 F (36.6 C) (Tympanic)   Resp 20   Ht 5\' 1"  (1.549 m)   Wt 157 lb 6.4 oz (71.4 kg)   BMI 29.74 kg/m   Filed Weights   04/23/17 1132  Weight: 157 lb 6.4 oz (71.4 kg)    GENERAL: Well-nourished well-developed; Alert,She is walking herself She is  accompanied by her friend.  EYES: no pallor or icterus OROPHARYNX: no thrush or ulceration; good dentition  NECK: supple, no masses felt LYMPH:  no palpable lymphadenopathy in the cervical, axillary or inguinal regions LUNGS: clear to auscultation and  No wheeze or crackles HEART/CVS: regular rate & rhythm and no murmurs; No lower extremity edema ABDOMEN:abdomen soft, non-tender and normal bowel sounds Musculoskeletal:no cyanosis of digits and no clubbing  PSYCH: alert & oriented x 3 with fluent speech NEURO: no focal motor/sensory deficits; No tremors. SKIN:  no rashes or significant lesions  LABORATORY DATA:  I have reviewed the data as listed    Component Value Date/Time   NA 136 04/23/2017 1119   NA 148 (H) 11/14/2015 1623   NA 142 11/14/2014 1506  K 3.1 (L) 04/23/2017 1119   K 3.8 11/14/2014 1506   CL 102 04/23/2017 1119   CL 110 11/14/2014 1506   CO2 23 04/23/2017 1119   CO2 23 11/14/2014 1506   GLUCOSE 169 (H) 04/23/2017 1119   GLUCOSE 104 (H) 11/14/2014 1506   BUN 24 (H) 04/23/2017 1119   BUN 13 11/14/2015 1623   BUN 31 (H) 11/14/2014 1506   CREATININE 1.27 (H) 04/23/2017 1119   CREATININE 1.08 (H) 11/14/2014 1506   CALCIUM 9.0 04/23/2017 1119   CALCIUM 9.5 11/14/2014 1506   PROT 6.6 04/23/2017 1119   ALBUMIN 3.5 04/23/2017 1119   ALBUMIN 3.9 11/14/2015 1623   AST 18 04/23/2017 1119   ALT 25 04/23/2017 1119   ALKPHOS 110 04/23/2017 1119   BILITOT 1.5 (H) 04/23/2017 1119   GFRNONAA 42 (L) 04/23/2017 1119   GFRNONAA 53 (L) 11/14/2014 1506   GFRAA 48 (L) 04/23/2017 1119   GFRAA >60 11/14/2014 1506    No results found for: SPEP, UPEP  Lab Results  Component Value Date   WBC 9.3 04/23/2017   NEUTROABS 7.4 (H) 04/23/2017   HGB 13.2 04/23/2017   HCT 37.8 04/23/2017   MCV 90.4 04/23/2017   PLT 271 04/23/2017      Chemistry      Component Value Date/Time   NA 136 04/23/2017 1119   NA 148 (H) 11/14/2015 1623   NA 142 11/14/2014 1506   K 3.1 (L)  04/23/2017 1119   K 3.8 11/14/2014 1506   CL 102 04/23/2017 1119   CL 110 11/14/2014 1506   CO2 23 04/23/2017 1119   CO2 23 11/14/2014 1506   BUN 24 (H) 04/23/2017 1119   BUN 13 11/14/2015 1623   BUN 31 (H) 11/14/2014 1506   CREATININE 1.27 (H) 04/23/2017 1119   CREATININE 1.08 (H) 11/14/2014 1506      Component Value Date/Time   CALCIUM 9.0 04/23/2017 1119   CALCIUM 9.5 11/14/2014 1506   ALKPHOS 110 04/23/2017 1119   AST 18 04/23/2017 1119   ALT 25 04/23/2017 1119   BILITOT 1.5 (H) 04/23/2017 1119     Conclusion:  - Progressed abnormal T2 and FLAIR hyperintensity in right hemisphere white matter and the thalamus since January.  - similar abnormal T2 and FLAIR hyperintensity has also developed in both cerebellar hemispheres.  - No associated enhancement. And no mass effect, but rather perhaps there is some volume loss in the affected areas.  - It is unclear whether these changes reflect progressed infiltrative glioblastoma or an inflammatory encephalitis such as related to therapy or a superimposed infection. Has there been a change in therapy since January? Is the patient immunocompromised? CSF analysis might be valuable.   Electronically Signed   By: Genevie Ann M.D.   On: 03/11/2017 09:03   RADIOGRAPHIC STUDIES: I have personally reviewed the radiological images as listed and agreed with the findings in the report. No results found.   ASSESSMENT & PLAN:  Glioblastoma determined by biopsy of brain ([place]) Right temporal GBM- status post resection followed by chemoradiation; currently status post 8 adjuvant cycles [stopped Temodar in June 2018-secondary to intolerance].   # 03/09/2017- MRI shows- signal changes ? recurrence with thalamic area/tumor bed; also shows enhancement in the cerebellar region- after extensive evaluation- thought not to be consistent with his progression. Currently on surveillance. No obvious evidence of disease progression. MRI has been  ordered today.  #Adrenal insufficiency- checked pharmacy unfortunately patient not on Steroid supplementation. Recommend starting dexamethasone  1 mg a day. New prescription sent.  # Epigastric discomfort question related to gastritis from steroids- recommend Protonix. Prescription sent.   # weight loss/ Memory issues/ coping issues-   # History of thyroid cancer- on Synthroid. Recent TSH normal. Continue Synthroid.   # Debility/steroid myopathy- agree with PT.   # follow up in  4 weeks; labs/MRI few days prior.   Orders Placed This Encounter  Procedures  . MR Brain W Wo Contrast    Standing Status:   Future    Standing Expiration Date:   04/23/2018    Order Specific Question:   If indicated for the ordered procedure, I authorize the administration of contrast media per Radiology protocol    Answer:   Yes    Order Specific Question:   What is the patient's sedation requirement?    Answer:   No Sedation    Order Specific Question:   Does the patient have a pacemaker or implanted devices?    Answer:   No    Order Specific Question:   Radiology Contrast Protocol - do NOT remove file path    Answer:   [email or web address]    Order Specific Question:   Reason for Exam additional comments    Answer:   GBM    Order Specific Question:   Preferred imaging location?    Answer:   [facility] (table limit-300lbs)  . Basic metabolic panel    Standing Status:   Future    Standing Expiration Date:   04/23/2018  . Comprehensive metabolic panel    Standing Status:   Future    Standing Expiration Date:   04/23/2018      Cammie Sickle, MD 04/23/2017 11:40 PM

## 2017-04-26 DIAGNOSIS — R262 Difficulty in walking, not elsewhere classified: Secondary | ICD-10-CM | POA: Diagnosis not present

## 2017-04-26 DIAGNOSIS — C719 Malignant neoplasm of brain, unspecified: Secondary | ICD-10-CM | POA: Diagnosis not present

## 2017-04-26 DIAGNOSIS — M6281 Muscle weakness (generalized): Secondary | ICD-10-CM | POA: Diagnosis not present

## 2017-04-26 DIAGNOSIS — E119 Type 2 diabetes mellitus without complications: Secondary | ICD-10-CM | POA: Diagnosis not present

## 2017-04-26 DIAGNOSIS — G40909 Epilepsy, unspecified, not intractable, without status epilepticus: Secondary | ICD-10-CM | POA: Diagnosis not present

## 2017-04-26 DIAGNOSIS — I10 Essential (primary) hypertension: Secondary | ICD-10-CM | POA: Diagnosis not present

## 2017-04-26 NOTE — Telephone Encounter (Signed)
Spoke with mary she states day after initial phone call to our office patient was seen by MD next day and blood pressure reading was normal. Patient has an upcoming appt scheduled 04/30/17 to follow up, I advised [name] to make sure patient brings all medications to appt. KW

## 2017-04-28 ENCOUNTER — Telehealth: Payer: Self-pay | Admitting: Family Medicine

## 2017-04-28 DIAGNOSIS — C719 Malignant neoplasm of brain, unspecified: Secondary | ICD-10-CM | POA: Diagnosis not present

## 2017-04-28 DIAGNOSIS — M6281 Muscle weakness (generalized): Secondary | ICD-10-CM | POA: Diagnosis not present

## 2017-04-28 DIAGNOSIS — G40909 Epilepsy, unspecified, not intractable, without status epilepticus: Secondary | ICD-10-CM | POA: Diagnosis not present

## 2017-04-28 DIAGNOSIS — I10 Essential (primary) hypertension: Secondary | ICD-10-CM | POA: Diagnosis not present

## 2017-04-28 DIAGNOSIS — R262 Difficulty in walking, not elsewhere classified: Secondary | ICD-10-CM | POA: Diagnosis not present

## 2017-04-28 DIAGNOSIS — E119 Type 2 diabetes mellitus without complications: Secondary | ICD-10-CM | POA: Diagnosis not present

## 2017-04-28 NOTE — Telephone Encounter (Signed)
Mardene Celeste at [facility] called to say discontinuing service for home care per patients request.  FYI  Thanks teri

## 2017-04-30 ENCOUNTER — Encounter: Payer: Self-pay | Admitting: Family Medicine

## 2017-04-30 ENCOUNTER — Ambulatory Visit (INDEPENDENT_AMBULATORY_CARE_PROVIDER_SITE_OTHER): Payer: Medicare Other | Admitting: Family Medicine

## 2017-04-30 VITALS — BP 102/76 | HR 72 | Temp 97.8°F | Resp 16 | Wt 157.0 lb

## 2017-04-30 DIAGNOSIS — I10 Essential (primary) hypertension: Secondary | ICD-10-CM

## 2017-04-30 NOTE — Patient Instructions (Signed)
[name] can help you set up your daily medicine box. Most of your medications are once a day. Your seizure medicine, Keppra, is twice daily. Your diabetes medication, glipizide, is taken 30 minutes before a meal once or twice a day.

## 2017-04-30 NOTE — Progress Notes (Signed)
Subjective:     Patient ID: Martha [name], female   DOB: [birth year], 71 y.o.   MRN: [id number]  HPI  Chief Complaint  Patient presents with  . Hypertension  She is accompanied by her friend, Martha [name], who provides history regarding Martha [name]'s short term memory deficits and trouble remembering her medication. She is to resume using a daily pill box which Martha [name] will set up for her. Martha [name] is fiercely independent and has discharged home health services: "I want to live alone." She is aware that she has a most likely terminal illness but wishes to remain in her home until medical circumstances prevent her. 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 I have reinforced the fact that she most likely will have a medical occurrence which will prevent her from continuing to live at home in the future. She acknowledges the possibility. F/u brain MRI is scheduled for 05/19/17.   Review of Systems     Objective:   Physical Exam  Constitutional: She appears well-developed and well-nourished. No distress.  Cardiovascular: Normal rate and regular rhythm.   Pulmonary/Chest: Breath sounds normal.  Musculoskeletal: She exhibits no edema (of lower extremities.).       Assessment:    1. Essential hypertension: stable    Plan:    Went over her medications and wrote down the two that are taken twice daily-Keppra and glipizide. However she may take the glipizide only once daily with the biggest meal of the day.

## 2017-05-19 ENCOUNTER — Other Ambulatory Visit: Payer: Self-pay | Admitting: Family Medicine

## 2017-05-19 ENCOUNTER — Ambulatory Visit
Admission: RE | Admit: 2017-05-19 | Discharge: 2017-05-19 | Disposition: A | Discharge status: Home / Self Care | Payer: Medicare Other | Source: Ambulatory Visit | Attending: Internal Medicine | Admitting: Internal Medicine

## 2017-05-19 DIAGNOSIS — E1169 Type 2 diabetes mellitus with other specified complication: Secondary | ICD-10-CM

## 2017-05-19 DIAGNOSIS — C719 Malignant neoplasm of brain, unspecified: Secondary | ICD-10-CM | POA: Diagnosis not present

## 2017-05-19 DIAGNOSIS — E785 Hyperlipidemia, unspecified: Principal | ICD-10-CM

## 2017-05-19 MED ORDER — GADOBENATE DIMEGLUMINE 529 MG/ML IV SOLN
14.0000 mL | Freq: Once | INTRAVENOUS | Status: AC | PRN
  Administered 2017-05-19: 14 mL via INTRAVENOUS

## 2017-05-20 DIAGNOSIS — C712 Malignant neoplasm of temporal lobe: Secondary | ICD-10-CM | POA: Diagnosis not present

## 2017-05-20 DIAGNOSIS — F419 Anxiety disorder, unspecified: Secondary | ICD-10-CM | POA: Diagnosis not present

## 2017-05-20 DIAGNOSIS — Z515 Encounter for palliative care: Secondary | ICD-10-CM | POA: Diagnosis not present

## 2017-05-20 DIAGNOSIS — R531 Weakness: Secondary | ICD-10-CM | POA: Diagnosis not present

## 2017-05-21 ENCOUNTER — Inpatient Hospital Stay: Payer: Medicare Other | Attending: Internal Medicine

## 2017-05-21 ENCOUNTER — Telehealth: Payer: Self-pay | Admitting: *Deleted

## 2017-05-21 ENCOUNTER — Inpatient Hospital Stay (HOSPITAL_BASED_OUTPATIENT_CLINIC_OR_DEPARTMENT_OTHER): Payer: Medicare Other | Admitting: Internal Medicine

## 2017-05-21 ENCOUNTER — Inpatient Hospital Stay: Payer: Medicare Other

## 2017-05-21 VITALS — BP 101/72 | HR 103

## 2017-05-21 VITALS — BP 85/65 | HR 118 | Temp 97.8°F | Resp 18

## 2017-05-21 DIAGNOSIS — C712 Malignant neoplasm of temporal lobe: Secondary | ICD-10-CM | POA: Diagnosis not present

## 2017-05-21 DIAGNOSIS — Z79899 Other long term (current) drug therapy: Secondary | ICD-10-CM

## 2017-05-21 DIAGNOSIS — E2740 Unspecified adrenocortical insufficiency: Secondary | ICD-10-CM

## 2017-05-21 DIAGNOSIS — I10 Essential (primary) hypertension: Secondary | ICD-10-CM | POA: Insufficient documentation

## 2017-05-21 DIAGNOSIS — Z85850 Personal history of malignant neoplasm of thyroid: Secondary | ICD-10-CM | POA: Insufficient documentation

## 2017-05-21 DIAGNOSIS — Z87891 Personal history of nicotine dependence: Secondary | ICD-10-CM | POA: Insufficient documentation

## 2017-05-21 DIAGNOSIS — E119 Type 2 diabetes mellitus without complications: Secondary | ICD-10-CM | POA: Insufficient documentation

## 2017-05-21 DIAGNOSIS — R251 Tremor, unspecified: Secondary | ICD-10-CM | POA: Insufficient documentation

## 2017-05-21 DIAGNOSIS — Z923 Personal history of irradiation: Secondary | ICD-10-CM | POA: Diagnosis not present

## 2017-05-21 DIAGNOSIS — E785 Hyperlipidemia, unspecified: Secondary | ICD-10-CM | POA: Insufficient documentation

## 2017-05-21 DIAGNOSIS — C719 Malignant neoplasm of brain, unspecified: Secondary | ICD-10-CM

## 2017-05-21 DIAGNOSIS — R634 Abnormal weight loss: Secondary | ICD-10-CM | POA: Insufficient documentation

## 2017-05-21 DIAGNOSIS — F329 Major depressive disorder, single episode, unspecified: Secondary | ICD-10-CM | POA: Diagnosis not present

## 2017-05-21 DIAGNOSIS — Z7984 Long term (current) use of oral hypoglycemic drugs: Secondary | ICD-10-CM | POA: Insufficient documentation

## 2017-05-21 LAB — CBC WITH DIFFERENTIAL/PLATELET
Basophils Absolute: 0.1 10*3/uL (ref 0–0.1)
Basophils Relative: 1 %
Eosinophils Absolute: 0.1 10*3/uL (ref 0–0.7)
Eosinophils Relative: 1 %
HCT: 39.7 % (ref 35.0–47.0)
Hemoglobin: 13.6 g/dL (ref 12.0–16.0)
Lymphocytes Relative: 13 %
Lymphs Abs: 1.3 10*3/uL (ref 1.0–3.6)
MCH: 31.8 pg (ref 26.0–34.0)
MCHC: 34.2 g/dL (ref 32.0–36.0)
MCV: 92.9 fL (ref 80.0–100.0)
Monocytes Absolute: 0.9 10*3/uL (ref 0.2–0.9)
Monocytes Relative: 9 %
Neutro Abs: 7.6 10*3/uL — ABNORMAL HIGH (ref 1.4–6.5)
Neutrophils Relative %: 76 %
Platelets: 256 10*3/uL (ref 150–440)
RBC: 4.28 MIL/uL (ref 3.80–5.20)
RDW: 15.1 % — ABNORMAL HIGH (ref 11.5–14.5)
WBC: 10 10*3/uL (ref 3.6–11.0)

## 2017-05-21 LAB — COMPREHENSIVE METABOLIC PANEL
ALBUMIN: 3.7 g/dL (ref 3.5–5.0)
ALT: 46 U/L (ref 14–54)
AST: 21 U/L (ref 15–41)
Alkaline Phosphatase: 98 U/L (ref 38–126)
Anion gap: 15 (ref 5–15)
BUN: 18 mg/dL (ref 6–20)
CHLORIDE: 99 mmol/L — AB (ref 101–111)
CO2: 21 mmol/L — AB (ref 22–32)
CREATININE: 1.1 mg/dL — AB (ref 0.44–1.00)
Calcium: 9.1 mg/dL (ref 8.9–10.3)
GFR calc Af Amer: 58 mL/min — ABNORMAL LOW (ref >60)
GFR calc non Af Amer: 50 mL/min — ABNORMAL LOW (ref >60)
GLUCOSE: 183 mg/dL — AB (ref 65–99)
Potassium: 3.4 mmol/L — ABNORMAL LOW (ref 3.5–5.1)
SODIUM: 135 mmol/L (ref 135–145)
Total Bilirubin: 1.6 mg/dL — ABNORMAL HIGH (ref 0.3–1.2)
Total Protein: 6.5 g/dL (ref 6.5–8.1)

## 2017-05-21 MED ORDER — DEXAMETHASONE SODIUM PHOSPHATE 10 MG/ML IJ SOLN
10.0000 mg | Freq: Once | INTRAMUSCULAR | Status: AC
  Administered 2017-05-21: 10 mg via INTRAMUSCULAR
  Filled 2017-05-21: qty 1

## 2017-05-21 MED ORDER — SODIUM CHLORIDE 0.9 % IV SOLN
Freq: Once | INTRAVENOUS | Status: AC
  Administered 2017-05-21: 13:00:00 via INTRAVENOUS
  Filled 2017-05-21: qty 1000

## 2017-05-21 MED ORDER — DEXAMETHASONE 2 MG PO TABS: 2.0000 mg | ORAL_TABLET | Freq: Every day | ORAL | 0 refills | Status: DC

## 2017-05-21 NOTE — Assessment & Plan Note (Addendum)
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   # 03/09/2017- MRI shows showed resolution of abnormal signal changes seen in July over right thalamic and bilateral cerebellar hemispheres. Right temporal lobe and hemisphere without abnormal enhancement and stable appearance of scattered lesions of left hemisphere thought to be related to treated thyroid metastases. No obvious evidence for disease progression. Currently on surveillance.   #Adrenal insufficiency- concerns for progression towards Adrenal Crisis in setting of hypotension, nausea, and disorientation/reduced alertness. Despite conversations, patient is off dexamethasone. Refill sent to pharmacy and reinforced with patient and caregiver again. Will give IV decadron 10mg  today  address possible adrenal crisis.   #Hypotension- may be related to possible adrenal crisis and/or poor PO intake. Encouraged oral hydration. Hold BP medication at this time and follow-up with primary care Monday for blood pressure. Will give 1L NaCl today IV.   # Epigastric discomfort- ?gastritis r/t steroids? - pt continues protonix. Symptoms controlled. Continue to monitor.    # weight loss/ Memory issues/ coping issues- Patient continues to refuse home care assistance but feel she would benefit from 24 hour care. She has agreed to once a week nursing visits and continued assistance with preparing medication boxes. Compliance reduced with BID dosing of medications. Will attempt once daily dosing regimens whenever possible. Will continue to encourage patient to accept assistance.  # History of thyroid cancer- on Synthroid. Recent TSH normal. Continue Synthroid.   # follow up in 2 weeks with labs   ADDENDUM- patient became restless during infusion and IV infiltrated. Patient had received 512ml NaCl and had not yet received decadron. Now  refusing IV placement. Will change order for decadron to IM administration. Ok to hold remaining IV fluids.

## 2017-05-21 NOTE — Telephone Encounter (Signed)
Patient seen by Palliative Care and has agreed to hospice services, if in agreement, please send orders. She has an appointment today for MRI results

## 2017-05-21 NOTE — Telephone Encounter (Signed)
Hospice informed by leaving voice mail message

## 2017-05-21 NOTE — Telephone Encounter (Signed)
Dr. Rogue Bussing stated that patient is not hospice appropriate.

## 2017-05-21 NOTE — Progress Notes (Signed)
[place] OFFICE PROGRESS NOTE  Patient Care Team: Carmon Ginsberg, [place] as PCP - General (Family Medicine)  No matching staging information was found for the patient.   Oncology History   1. 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 (January, 2017; s/p resection Dr.Nundkumar; [place]) 2 starting radiation therapy and Temodar January of 2017 3.She has finished radiation therapy in April of 2017 and also finished Temodar for approximately 6 weeks 4.Patient started on maintenance Temodar from Jan 03, 2016; July 2017- MRI no recurrence; left hemisphere- whitish plaques ? 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    #Thyroid cancer s/p Thyroidectomy [s/p RAUI; 1967]     Glioblastoma determined by biopsy of brain ([place])   08/27/2015 Initial Diagnosis    Glioblastoma determined by biopsy of brain ([place])       Glioblastoma ([place]) (Resolved)    Initial Diagnosis    Glioblastoma ([place])      INTERVAL HISTORY:  Martha [name] 71 y.o.  female pleasant patient above history of GBM right frontal status post resection; Status post adjuvant Temodar 8 months [stopped in June 2018 sec to intol].  Patient also diagnosed with adrenal insufficiency- had noted significant improvement with steroids.  She returns to clinic today with her friend who helps take care of her reporting that she feels poorly, has mild nausea, fatigue, and feeling poorly. Her memory continues to be impaired but is worst past few days. She ran out of the decadron oral tablets a few days ago secondary to adrenal insuffiencey. Caregiver says she took blood pressure medications today but has not taken nausea medication. Appetite is poor and patient has 8 lb weight loss. Denies pain.   Patient is poor historian and patient has refused 24 hour care. Patient had home care referral  yesterday and agreed to once a week nurse visits with vital signs and med box preparation. She has dismissed other home services in the past saying they are 'too involved' and 'invasive'. Caregiver reports that patient is eager to remain independent but is having difficulty in doing so and has not coped well with prognosis and managing her health condition.    REVIEW OF SYSTEMS:  A complete 10 point review of system is done which is negative except mentioned above/history of present illness.   PAST MEDICAL HISTORY :  Past Medical History:  Diagnosis Date  . Cancer [facility])    thyroid cancer treated with radioactive iodine  . Depression   . Diabetes mellitus without complication ([place])   . Epilepsy ([place])   . Glioblastoma determined by biopsy of brain ([place])   . Hyperlipidemia   . Hypertension   . Thyroid disease    h/o thyroid ca- 30 years ago  . Varicose vein     PAST SURGICAL HISTORY :   Past Surgical History:  Procedure Laterality Date  . APPLICATION OF CRANIAL NAVIGATION N/A 08/02/2015   Procedure: APPLICATION OF CRANIAL NAVIGATION;  Surgeon: Consuella Lose, MD;  Location: [place] NEURO ORS;  Service: Neurosurgery;  Laterality: N/A;  APPLICATION OF CRANIAL NAVIGATION  . BILATERAL CARPAL TUNNEL RELEASE  1990,1991   left hand in 1990 right hand in '91  . CERVICAL SPINE SURGERY  05/1997   spinal fusion  . CHOLECYSTECTOMY  04/24/2008   status post laparoscopic Dr.Ely  . CRANIOTOMY Right 08/02/2015   Procedure: CRANIOTOMY TUMOR EXCISION;  Surgeon:  Consuella Lose, MD;  Location: [place] NEURO ORS;  Service: Neurosurgery;  Laterality: Right;  CRANIOTOMY TUMOR EXCISION  . radioactive iodine    . THYROIDECTOMY, PARTIAL  1964   due to cancerous tumor  . TOE SURGERY Left 1989    FAMILY HISTORY :   Family History  Problem Relation Age of Onset  . Arthritis Mother   . Hyperlipidemia Mother   . Hypertension Mother   . Heart disease Mother   . Diabetes Mother        type 2  . Thyroid  disease Mother   . Cancer Father        colon  . Alcohol abuse Brother   . Hyperlipidemia Brother   . Hypertension Brother   . Breast cancer Neg Hx   . Ovarian cancer Neg Hx     SOCIAL HISTORY:   Social History  Substance Use Topics  . Smoking status: Former Research scientist (life sciences)  . Smokeless tobacco: Never Used     Comment: quit smoking in 2007  . Alcohol use No    ALLERGIES:  is allergic to ampicillin; fish-derived products; pravastatin; tomato; and penicillins.  MEDICATIONS:  Current Outpatient Prescriptions  Medication Sig Dispense Refill  . amLODipine (NORVASC) 5 MG tablet Take 1 tablet (5 mg total) by mouth daily. 90 tablet 3  . glipiZIDE (GLUCOTROL) 5 MG tablet TAKE 1 TABLET BY MOUTH TWICE A DAY 30 MINUTES BEFORE A MEAL 60 tablet 3  . levETIRAcetam (KEPPRA) 500 MG tablet Take 1 tablet (500 mg total) by mouth 2 (two) times daily. Reported on 12/11/2015 180 tablet 3  . levothyroxine (SYNTHROID, LEVOTHROID) 100 MCG tablet Take 1 tablet (100 mcg total) by mouth daily. 90 tablet 3  . lisinopril (PRINIVIL,ZESTRIL) 20 MG tablet Take 1 tablet by mouth daily.    Marland Kitchen dexamethasone (DECADRON) 2 MG tablet Take 1 tablet (2 mg total) by mouth daily. 30 tablet 0  . feeding supplement, ENSURE ENLIVE, (ENSURE ENLIVE) LIQD Take 237 mLs by mouth 3 (three) times daily between meals. (Patient not taking: Reported on 05/21/2017) 90 Bottle 0  . metoCLOPramide (REGLAN) 10 MG tablet Take 1 tablet by mouth as needed for nausea.  0  . promethazine (PHENERGAN) 25 MG tablet Take 1 tablet (25 mg total) by mouth every 6 (six) hours as needed for nausea or vomiting. (Patient not taking: Reported on 05/21/2017) 30 tablet 0   No current facility-administered medications for this visit.     PHYSICAL EXAMINATION: ECOG PERFORMANCE STATUS: 2 - Symptomatic, <50% confined to bed  BP (!) 85/65   Pulse (!) 118   Temp 97.8 F (36.6 C) (Tympanic)   Resp 18   There were no vitals filed for this visit.  GENERAL:  well-developed, fatigued, female seen sitting in wheelchair. More frail compared to previous visits. She is accompanied by her friend.  EYES: no pallor or icterus OROPHARYNX: no thrush or ulceration; good dentition  NECK: supple, no masses felt LYMPH:  no palpable lymphadenopathy in the cervical, axillary or inguinal regions LUNGS: clear to auscultation and  No wheeze or crackles HEART/CVS: regular rate & rhythm and no murmurs; No lower extremity edema ABDOMEN:abdomen soft, non-tender and normal bowel sounds Musculoskeletal:no cyanosis of digits and no clubbing  PSYCH: alert, attention and memory wax and wane during interview. Poor historian short and long term NEURO: no focal motor/sensory deficits; No tremors. SKIN:  no rashes or significant lesions. Cool to touch.  LABORATORY DATA:  I have reviewed the data as listed  Component Value Date/Time   NA 135 05/21/2017 1119   NA 148 (H) 11/14/2015 1623   NA 142 11/14/2014 1506   K 3.4 (L) 05/21/2017 1119   K 3.8 11/14/2014 1506   CL 99 (L) 05/21/2017 1119   CL 110 11/14/2014 1506   CO2 21 (L) 05/21/2017 1119   CO2 23 11/14/2014 1506   GLUCOSE 183 (H) 05/21/2017 1119   GLUCOSE 104 (H) 11/14/2014 1506   BUN 18 05/21/2017 1119   BUN 13 11/14/2015 1623   BUN 31 (H) 11/14/2014 1506   CREATININE 1.10 (H) 05/21/2017 1119   CREATININE 1.08 (H) 11/14/2014 1506   CALCIUM 9.1 05/21/2017 1119   CALCIUM 9.5 11/14/2014 1506   PROT 6.5 05/21/2017 1119   ALBUMIN 3.7 05/21/2017 1119   ALBUMIN 3.9 11/14/2015 1623   AST 21 05/21/2017 1119   ALT 46 05/21/2017 1119   ALKPHOS 98 05/21/2017 1119   BILITOT 1.6 (H) 05/21/2017 1119   GFRNONAA 50 (L) 05/21/2017 1119   GFRNONAA 53 (L) 11/14/2014 1506   GFRAA 58 (L) 05/21/2017 1119   GFRAA >60 11/14/2014 1506    No results found for: SPEP, UPEP  Lab Results  Component Value Date   WBC 10.0 05/21/2017   NEUTROABS 7.6 (H) 05/21/2017   HGB 13.6 05/21/2017   HCT 39.7 05/21/2017   MCV 92.9  05/21/2017   PLT 256 05/21/2017      Chemistry      Component Value Date/Time   NA 135 05/21/2017 1119   NA 148 (H) 11/14/2015 1623   NA 142 11/14/2014 1506   K 3.4 (L) 05/21/2017 1119   K 3.8 11/14/2014 1506   CL 99 (L) 05/21/2017 1119   CL 110 11/14/2014 1506   CO2 21 (L) 05/21/2017 1119   CO2 23 11/14/2014 1506   BUN 18 05/21/2017 1119   BUN 13 11/14/2015 1623   BUN 31 (H) 11/14/2014 1506   CREATININE 1.10 (H) 05/21/2017 1119   CREATININE 1.08 (H) 11/14/2014 1506      Component Value Date/Time   CALCIUM 9.1 05/21/2017 1119   CALCIUM 9.5 11/14/2014 1506   ALKPHOS 98 05/21/2017 1119   AST 21 05/21/2017 1119   ALT 46 05/21/2017 1119   BILITOT 1.6 (H) 05/21/2017 1119     MRI HEAD WITHOUT AND WITH CONTRAST 05/19/2017 IMPRESSION: 1. Resolved abnormal but nonspecific right thalamic and bilateral cerebellar hemisphere T2/FLAIR signal seen in July. 2. Stable post treatment appearance of the right temporal lobe and hemisphere, with no abnormal enhancement. 3. Continued stable appearance of unusual scattered left hemisphere calcified lesions thought related to treated thyroid metastases. 4. No new intracranial abnormality.   Electronically Signed   By: Genevie Ann M.D.   On: 05/19/2017 12:42   RADIOGRAPHIC STUDIES: I have personally reviewed the radiological images as listed and agreed with the findings in the report. No results found.   ASSESSMENT & PLAN:  Glioblastoma determined by biopsy of brain ([place]) Right temporal GBM- status post resection followed by chemoradiation; currently status post 8 adjuvant cycles [stopped Temodar in June 2018-secondary to intolerance].   # 03/09/2017- MRI shows showed resolution of abnormal signal changes seen in July over right thalamic and bilateral cerebellar hemispheres. Right temporal lobe and hemisphere without abnormal enhancement and stable appearance of scattered lesions of left hemisphere thought to be related to treated  thyroid metastases. No obvious evidence for disease progression. Currently on surveillance.   #Adrenal insufficiency- concerns for progression towards Adrenal Crisis in setting of hypotension,  nausea, and disorientation/reduced alertness. Despite conversations, patient is off dexamethasone. Refill sent to pharmacy and reinforced with patient and caregiver again. Will give IV decadron 10mg  today  address possible adrenal crisis.   #Hypotension- may be related to possible adrenal crisis and/or poor PO intake. Encouraged oral hydration. Hold BP medication at this time and follow-up with primary care Monday for blood pressure. Will give 1L NaCl today IV.   # Epigastric discomfort- ?gastritis r/t steroids? - pt continues protonix. Symptoms controlled. Continue to monitor.    # weight loss/ Memory issues/ coping issues- Patient continues to refuse home care assistance but feel she would benefit from 24 hour care. She has agreed to once a week nursing visits and continued assistance with preparing medication boxes. Compliance reduced with BID dosing of medications. Will attempt once daily dosing regimens whenever possible. Will continue to encourage patient to accept assistance.  # History of thyroid cancer- on Synthroid. Recent TSH normal. Continue Synthroid.   # follow up in 2 weeks with labs   ADDENDUM- patient became restless during infusion and IV infiltrated. Patient had received 520ml NaCl and had not yet received decadron. Now refusing IV placement. Will change order for decadron to IM administration. Ok to hold remaining IV fluids.    Orders Placed This Encounter  Procedures  . Basic metabolic panel    Standing Status:   Future    Standing Expiration Date:   05/21/2018  . CBC with Differential    Standing Status:   Future    Standing Expiration Date:   05/21/2018      Verlon Au, NP 05/21/2017 3:21 PM

## 2017-05-24 ENCOUNTER — Telehealth: Payer: Self-pay | Admitting: Internal Medicine

## 2017-05-24 NOTE — Telephone Encounter (Signed)
Hospice referral has been faxed.

## 2017-05-24 NOTE — Telephone Encounter (Signed)
Spoke to [facility] palliative care nurse- feels patient is hospice appropriate/patient is interested in hospice. As per Magda Paganini patient not interested in any further treatment options moving forward. Recommend hospice.

## 2017-05-30 ENCOUNTER — Other Ambulatory Visit: Payer: Self-pay | Admitting: Family Medicine

## 2017-06-01 ENCOUNTER — Telehealth: Payer: Self-pay | Admitting: *Deleted

## 2017-06-01 NOTE — Telephone Encounter (Signed)
Error

## 2017-06-03 ENCOUNTER — Ambulatory Visit: Payer: Medicare Other | Admitting: Internal Medicine

## 2017-06-03 ENCOUNTER — Other Ambulatory Visit: Payer: Medicare Other

## 2017-06-07 ENCOUNTER — Inpatient Hospital Stay (HOSPITAL_BASED_OUTPATIENT_CLINIC_OR_DEPARTMENT_OTHER): Payer: Medicare Other | Admitting: Internal Medicine

## 2017-06-07 ENCOUNTER — Inpatient Hospital Stay: Payer: Medicare Other

## 2017-06-07 VITALS — BP 132/87 | HR 85 | Temp 97.4°F | Resp 14 | Wt 151.0 lb

## 2017-06-07 DIAGNOSIS — C712 Malignant neoplasm of temporal lobe: Secondary | ICD-10-CM

## 2017-06-07 DIAGNOSIS — E2740 Unspecified adrenocortical insufficiency: Secondary | ICD-10-CM

## 2017-06-07 DIAGNOSIS — C719 Malignant neoplasm of brain, unspecified: Secondary | ICD-10-CM

## 2017-06-07 DIAGNOSIS — R634 Abnormal weight loss: Secondary | ICD-10-CM

## 2017-06-07 DIAGNOSIS — Z923 Personal history of irradiation: Secondary | ICD-10-CM | POA: Diagnosis not present

## 2017-06-07 DIAGNOSIS — Z79899 Other long term (current) drug therapy: Secondary | ICD-10-CM

## 2017-06-07 DIAGNOSIS — R251 Tremor, unspecified: Secondary | ICD-10-CM

## 2017-06-07 DIAGNOSIS — Z85850 Personal history of malignant neoplasm of thyroid: Secondary | ICD-10-CM | POA: Diagnosis not present

## 2017-06-07 LAB — CBC WITH DIFFERENTIAL/PLATELET
BASOS PCT: 1 %
Basophils Absolute: 0.2 10*3/uL — ABNORMAL HIGH (ref 0–0.1)
EOS ABS: 0 10*3/uL (ref 0–0.7)
Eosinophils Relative: 0 %
HEMATOCRIT: 34.8 % — AB (ref 35.0–47.0)
HEMOGLOBIN: 12 g/dL (ref 12.0–16.0)
LYMPHS ABS: 2.1 10*3/uL (ref 1.0–3.6)
Lymphocytes Relative: 16 %
MCH: 31.6 pg (ref 26.0–34.0)
MCHC: 34.4 g/dL (ref 32.0–36.0)
MCV: 92 fL (ref 80.0–100.0)
MONO ABS: 0.6 10*3/uL (ref 0.2–0.9)
Monocytes Relative: 5 %
NEUTROS PCT: 78 %
Neutro Abs: 10.3 10*3/uL — ABNORMAL HIGH (ref 1.4–6.5)
Platelets: 315 10*3/uL (ref 150–440)
RBC: 3.79 MIL/uL — ABNORMAL LOW (ref 3.80–5.20)
RDW: 14.6 % — AB (ref 11.5–14.5)
WBC: 13.2 10*3/uL — ABNORMAL HIGH (ref 3.6–11.0)

## 2017-06-07 LAB — BASIC METABOLIC PANEL
Anion gap: 11 (ref 5–15)
BUN: 24 mg/dL — ABNORMAL HIGH (ref 6–20)
CALCIUM: 9 mg/dL (ref 8.9–10.3)
CHLORIDE: 105 mmol/L (ref 101–111)
CO2: 27 mmol/L (ref 22–32)
CREATININE: 0.96 mg/dL (ref 0.44–1.00)
GFR calc Af Amer: >60 mL/min (ref >60)
GFR calc non Af Amer: 59 mL/min — ABNORMAL LOW (ref >60)
GLUCOSE: 165 mg/dL — AB (ref 65–99)
Potassium: 3.2 mmol/L — ABNORMAL LOW (ref 3.5–5.1)
Sodium: 143 mmol/L (ref 135–145)

## 2017-06-07 LAB — TSH: TSH: 1.211 u[IU]/mL (ref 0.350–4.500)

## 2017-06-07 MED ORDER — DEXAMETHASONE 1 MG PO TABS: 1.0000 mg | ORAL_TABLET | Freq: Every day | ORAL | 3 refills | Status: DC

## 2017-06-07 NOTE — Assessment & Plan Note (Addendum)
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   # 05/19/17 MRI showed resolution of abnormal signal changes compared to July 2018 imaging. Stable appearance. No obvious evidence of disease progression. Will plan to re-image in January 2019.  #Adrenal insufficiency- continue steroid taper reducing daily dose of steroids to 1 mg daily.   # Action tremor- most noticeable during movement interfering with ADLs at times. May be related to steroids. Discussed ref to neurology but pt and caregiver declined at this time. Can re-consider in the future # weight loss- poor oral intake likely worsened d/t memory decline. 6 lb weight loss over past month. May also be r/t decrease in steroid dosage. Patient would benefit from increased assistance but refuses. Caregiver/friend continues to provide meals. Refuses Meals on Wheels. Encouraged oral intake of high protein foods and/or supplemental shakes. Caregiver to monitor meal consumption. Will continue to follow.  # Hospice referral- hospice NP has seen pt and will provide services. Will re-send paperwork to initiate process.   # Advance Directives- caregiver questions if patient completed advanced directives and HCPOA while inpatient. Unable to locate these documents. Caregiver to follow up, also discussed that hospice can assist with creating these documents.    # URI- wbc 13.2, anc 10.3. Viral like symptoms currently improving. Ok to use mucinex as needed and claritin daily for symptom management. Will hold off on antibiotic at this time but if symptoms persist, she becomes febrile, weak, or worsening symptoms we can re-consider antibiotics.   # Epigastric discomfort- stable. pt continues protonix. Symptoms controlled. Continue to monitor.    # History of thyroid cancer- on Synthroid. TSH today 1.211. Continue Synthroid. Will re-check TSH in  January 2019.  # follow up in 4 weeks with labs

## 2017-06-07 NOTE — Progress Notes (Signed)
[facility] OFFICE PROGRESS NOTE  Patient Care Team: Carmon Ginsberg, [place] as PCP - General (Family Medicine)  No matching staging information was found for the patient.   Oncology History   1. 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 (January, 2017; s/p resection Dr.Nundkumar; [place]) 2 starting radiation therapy and Temodar January of 2017 3.She has finished radiation therapy in April of 2017 and also finished Temodar for approximately 6 weeks 4.Patient started on maintenance Temodar from Jan 03, 2016; July 2017- MRI no recurrence; left hemisphere- whitish plaques ? 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    #Thyroid cancer s/p Thyroidectomy [s/p RAUI; 1967]     Glioblastoma determined by biopsy of brain ([place])   08/27/2015 Initial Diagnosis    Glioblastoma determined by biopsy of brain ([place])       Glioblastoma ([place]) (Resolved)    Initial Diagnosis    Glioblastoma ([place])      INTERVAL HISTORY:  Martha [name] 71 y.o.  female pleasant patient above history of GBM right frontal s/p resection; Status post adjuvant Temodar 8 months which was stopped in June 2018 d/t intolerance.   Previously presented w/ s/s of adrenal crisis after she had stopped taking steroids. Today her caregiver/friend reports that her energy is better, memory/cognition continues to wax and wane. Patient persistently is poor historian. Her alertness has improved compared to last visit and both patient and caregiver report that she continues to take oral steroids daily. Caregiver reports that patient has a hard time remembering to take medications twice a day but does seem to be compliant with once daily dosing. Caregiver concerned that short term memory is significantly impaired.   She was referred to hospice and spoke to Georgeanne Nim, NP who felt she was  appropriate for services. Patient had agreed to once a week visits but says that Hospice requires additional paperwork to move forward with visit. Patient has previously declined in home physical therapy and is overall reluctant to have additional home care.   Patient's appetite continues to decline and she has had a 6 lb weight loss in past month. Caregiver reports that she often provides patient with daily meals and checks in to ensure she is eating. Caregiver feels intake has reduced. Denies nausea, mouth sores, or taste changes.   Caregiver and patient also concerned about shaking ok her hands which she has had in the past but they feel is somewhat worse recently. Patient reports it interferes with her ability to carry out fine motor tasks. She also has had some upper respiratory symptoms for more than a week. Pt c/o sore throat, some cough, and nasal drainage.   REVIEW OF SYSTEMS:  A complete 10 point review of system is done which is negative except mentioned above/history of present illness.   PAST MEDICAL HISTORY :  Past Medical History:  Diagnosis Date  . Cancer [facility])    thyroid cancer treated with radioactive iodine  . Depression   . Diabetes mellitus without complication ([place])   . Epilepsy ([place])   . Glioblastoma determined by biopsy of brain ([place])   . Hyperlipidemia   . Hypertension   . Thyroid disease    h/o thyroid ca- 30 years ago  . Varicose vein     PAST SURGICAL HISTORY :   Past Surgical History:  Procedure Laterality Date  . APPLICATION OF  CRANIAL NAVIGATION N/A 08/02/2015   Procedure: APPLICATION OF CRANIAL NAVIGATION;  Surgeon: Consuella Lose, MD;  Location: [facility];  Service: Neurosurgery;  Laterality: N/A;  APPLICATION OF CRANIAL NAVIGATION  . BILATERAL CARPAL TUNNEL RELEASE  1990,1991   left hand in 1990 right hand in '91  . CERVICAL SPINE SURGERY  05/1997   spinal fusion  . CHOLECYSTECTOMY  04/24/2008   status post laparoscopic Dr.Ely  . CRANIOTOMY  Right 08/02/2015   Procedure: CRANIOTOMY TUMOR EXCISION;  Surgeon: Consuella Lose, MD;  Location: [facility];  Service: Neurosurgery;  Laterality: Right;  CRANIOTOMY TUMOR EXCISION  . radioactive iodine    . THYROIDECTOMY, PARTIAL  1964   due to cancerous tumor  . TOE SURGERY Left 1989    FAMILY HISTORY :   Family History  Problem Relation Age of Onset  . Arthritis Mother   . Hyperlipidemia Mother   . Hypertension Mother   . Heart disease Mother   . Diabetes Mother        type 2  . Thyroid disease Mother   . Cancer Father        colon  . Alcohol abuse Brother   . Hyperlipidemia Brother   . Hypertension Brother   . Breast cancer Neg Hx   . Ovarian cancer Neg Hx     SOCIAL HISTORY:   Social History  Substance Use Topics  . Smoking status: Former Research scientist (life sciences)  . Smokeless tobacco: Never Used     Comment: quit smoking in 2007  . Alcohol use No    ALLERGIES:  is allergic to ampicillin; fish-derived products; pravastatin; tomato; and penicillins.  MEDICATIONS:  Current Outpatient Prescriptions  Medication Sig Dispense Refill  . amLODipine (NORVASC) 5 MG tablet Take 1 tablet (5 mg total) by mouth daily. 90 tablet 3  . glipiZIDE (GLUCOTROL) 5 MG tablet TAKE 1 TABLET BY MOUTH TWICE A DAY 30 MINUTES BEFORE A MEAL 60 tablet 3  . levETIRAcetam (KEPPRA) 500 MG tablet Take 1 tablet (500 mg total) by mouth 2 (two) times daily. Reported on 12/11/2015 180 tablet 3  . levothyroxine (SYNTHROID, LEVOTHROID) 100 MCG tablet Take 1 tablet (100 mcg total) by mouth daily. 90 tablet 3  . lisinopril (PRINIVIL,ZESTRIL) 20 MG tablet Take 1 tablet by mouth daily.    . metoCLOPramide (REGLAN) 10 MG tablet Take 1 tablet by mouth as needed for nausea.  0  . dexamethasone (DECADRON) 1 MG tablet Take 1 tablet (1 mg total) by mouth daily with breakfast. 30 tablet 3  . feeding supplement, ENSURE ENLIVE, (ENSURE ENLIVE) LIQD Take 237 mLs by mouth 3 (three) times daily between meals. (Patient not taking:  Reported on 05/21/2017) 90 Bottle 0  . promethazine (PHENERGAN) 25 MG tablet Take 1 tablet (25 mg total) by mouth every 6 (six) hours as needed for nausea or vomiting. (Patient not taking: Reported on 05/21/2017) 30 tablet 0   No current facility-administered medications for this visit.     PHYSICAL EXAMINATION: ECOG PERFORMANCE STATUS: 1 - Symptomatic but completely ambulatory  BP 132/87 (BP Location: Left Arm, Patient Position: Sitting)   Pulse 85   Temp (!) 97.4 F (36.3 C) (Tympanic)   Resp 14   Wt 151 lb (68.5 kg)   BMI 28.53 kg/m   Filed Weights   06/07/17 1142  Weight: 151 lb (68.5 kg)    GENERAL: Well-nourished well-developed; Alert. In wheelchair. Accompanied by friend. EYES: no pallor or icterus OROPHARYNX: no thrush or ulceration; good dentition  NECK: supple,  no masses felt LYMPH:  no palpable lymphadenopathy in the cervical, axillary or inguinal regions LUNGS: clear to auscultation and  No wheeze or crackles HEART/CVS: regular rate & rhythm. no murmurs; No lower extremity edema ABDOMEN: abdomen soft, non-tender and normal bowel sounds Musculoskeletal: no cyanosis of digits and no clubbing  PSYCH: alert & oriented x 3 with fluent speech. Short term memory is poor. Had to repeat discussions multiple times. Pt forgetful to conversations had moments before.  NEURO: no focal motor/sensory deficits; Mild action tremor of right hand.  SKIN: no rashes or significant lesions  LABORATORY DATA:  I have reviewed the data as listed    Component Value Date/Time   NA 143 06/07/2017 1120   NA 148 (H) 11/14/2015 1623   NA 142 11/14/2014 1506   K 3.2 (L) 06/07/2017 1120   K 3.8 11/14/2014 1506   CL 105 06/07/2017 1120   CL 110 11/14/2014 1506   CO2 27 06/07/2017 1120   CO2 23 11/14/2014 1506   GLUCOSE 165 (H) 06/07/2017 1120   GLUCOSE 104 (H) 11/14/2014 1506   BUN 24 (H) 06/07/2017 1120   BUN 13 11/14/2015 1623   BUN 31 (H) 11/14/2014 1506   CREATININE 0.96 06/07/2017  1120   CREATININE 1.08 (H) 11/14/2014 1506   CALCIUM 9.0 06/07/2017 1120   CALCIUM 9.5 11/14/2014 1506   PROT 6.5 05/21/2017 1119   ALBUMIN 3.7 05/21/2017 1119   ALBUMIN 3.9 11/14/2015 1623   AST 21 05/21/2017 1119   ALT 46 05/21/2017 1119   ALKPHOS 98 05/21/2017 1119   BILITOT 1.6 (H) 05/21/2017 1119   GFRNONAA 59 (L) 06/07/2017 1120   GFRNONAA 53 (L) 11/14/2014 1506   GFRAA >60 06/07/2017 1120   GFRAA >60 11/14/2014 1506    No results found for: SPEP, UPEP  Lab Results  Component Value Date   WBC 13.2 (H) 06/07/2017   NEUTROABS 10.3 (H) 06/07/2017   HGB 12.0 06/07/2017   HCT 34.8 (L) 06/07/2017   MCV 92.0 06/07/2017   PLT 315 06/07/2017      Chemistry      Component Value Date/Time   NA 143 06/07/2017 1120   NA 148 (H) 11/14/2015 1623   NA 142 11/14/2014 1506   K 3.2 (L) 06/07/2017 1120   K 3.8 11/14/2014 1506   CL 105 06/07/2017 1120   CL 110 11/14/2014 1506   CO2 27 06/07/2017 1120   CO2 23 11/14/2014 1506   BUN 24 (H) 06/07/2017 1120   BUN 13 11/14/2015 1623   BUN 31 (H) 11/14/2014 1506   CREATININE 0.96 06/07/2017 1120   CREATININE 1.08 (H) 11/14/2014 1506      Component Value Date/Time   CALCIUM 9.0 06/07/2017 1120   CALCIUM 9.5 11/14/2014 1506   ALKPHOS 98 05/21/2017 1119   AST 21 05/21/2017 1119   ALT 46 05/21/2017 1119   BILITOT 1.6 (H) 05/21/2017 1119     MR BRAIN W WO CONTRAST 05/19/2017 IMPRESSION: 1. Resolved abnormal but nonspecific right thalamic and bilateral cerebellar hemisphere T2/FLAIR signal seen in July. 2. Stable post treatment appearance of the right temporal lobe and hemisphere, with no abnormal enhancement. 3. Continued stable appearance of unusual scattered left hemisphere calcified lesions thought related to treated thyroid metastases. 4. No new intracranial abnormality.   Electronically Signed   By: Genevie Ann M.D.   On: 05/19/2017 12:42  RADIOGRAPHIC STUDIES: I have personally reviewed the radiological images as  listed and agreed with the findings in the report.  No results found.   ASSESSMENT & PLAN:  Glioblastoma determined by biopsy of brain ([place]) Right temporal GBM- status post resection followed by chemoradiation; currently status post 8 adjuvant cycles [stopped Temodar in June 2018-secondary to intolerance].   # 05/19/17 MRI showed resolution of abnormal signal changes compared to July 2018 imaging. Stable appearance. No obvious evidence of disease progression. Will plan to re-image in January 2019.  #Adrenal insufficiency- continue steroid taper reducing daily dose of steroids to 1 mg daily.   # Action tremor- most noticeable during movement interfering with ADLs at times. May be related to steroids. Discussed ref to neurology but pt and caregiver declined at this time. Can re-consider in the future # weight loss- poor oral intake likely worsened d/t memory decline. 6 lb weight loss over past month. May also be r/t decrease in steroid dosage. Patient would benefit from increased assistance but refuses. Caregiver/friend continues to provide meals. Refuses Meals on Wheels. Encouraged oral intake of high protein foods and/or supplemental shakes. Caregiver to monitor meal consumption. Will continue to follow.  # Hospice referral- hospice NP has seen pt and will provide services. Will re-send paperwork to initiate process.   # Advance Directives- caregiver questions if patient completed advanced directives and HCPOA while inpatient. Unable to locate these documents. Caregiver to follow up, also discussed that hospice can assist with creating these documents.    # URI- wbc 13.2, anc 10.3. Viral like symptoms currently improving. Ok to use mucinex as needed and claritin daily for symptom management. Will hold off on antibiotic at this time but if symptoms persist, she becomes febrile, weak, or worsening symptoms we can re-consider antibiotics.   # Epigastric discomfort- stable. pt continues protonix.  Symptoms controlled. Continue to monitor.    # History of thyroid cancer- on Synthroid. TSH today 1.211. Continue Synthroid. Will re-check TSH in January 2019.  # follow up in 4 weeks with labs    Orders Placed This Encounter  Procedures  . TSH    Standing Status:   Future    Number of Occurrences:   1    Standing Expiration Date:   07/08/2017  . CBC with Differential    Standing Status:   Standing    Number of Occurrences:   12    Standing Expiration Date:   12/07/2018  . Comprehensive metabolic panel    Standing Status:   Standing    Number of Occurrences:   12    Standing Expiration Date:   12/07/2018   Beckey Rutter, DNP AGNP-C 06/07/17 4:10 PM     Verlon Au, NP 06/07/2017 4:14 PM

## 2017-06-07 NOTE — Patient Instructions (Signed)
Will decrease steroids to 1 mg daily.  She can start taking claritin daily and mucinex as needed for congestion and cough. We will update and send paperwork to Palliative Care. Encourage oral intake of protein rich foods and monitor weight to avoid further weight loss.

## 2017-06-14 DIAGNOSIS — E2740 Unspecified adrenocortical insufficiency: Secondary | ICD-10-CM | POA: Diagnosis not present

## 2017-06-14 DIAGNOSIS — R630 Anorexia: Secondary | ICD-10-CM | POA: Diagnosis not present

## 2017-06-14 DIAGNOSIS — R634 Abnormal weight loss: Secondary | ICD-10-CM | POA: Diagnosis not present

## 2017-06-14 DIAGNOSIS — G252 Other specified forms of tremor: Secondary | ICD-10-CM | POA: Diagnosis not present

## 2017-06-14 DIAGNOSIS — C719 Malignant neoplasm of brain, unspecified: Secondary | ICD-10-CM | POA: Diagnosis not present

## 2017-06-14 DIAGNOSIS — Z85850 Personal history of malignant neoplasm of thyroid: Secondary | ICD-10-CM | POA: Diagnosis not present

## 2017-06-14 DIAGNOSIS — Z7984 Long term (current) use of oral hypoglycemic drugs: Secondary | ICD-10-CM | POA: Diagnosis not present

## 2017-06-14 DIAGNOSIS — I8393 Asymptomatic varicose veins of bilateral lower extremities: Secondary | ICD-10-CM | POA: Diagnosis not present

## 2017-06-14 DIAGNOSIS — F329 Major depressive disorder, single episode, unspecified: Secondary | ICD-10-CM | POA: Diagnosis not present

## 2017-06-14 DIAGNOSIS — E119 Type 2 diabetes mellitus without complications: Secondary | ICD-10-CM | POA: Diagnosis not present

## 2017-06-14 DIAGNOSIS — E785 Hyperlipidemia, unspecified: Secondary | ICD-10-CM | POA: Diagnosis not present

## 2017-06-14 DIAGNOSIS — I10 Essential (primary) hypertension: Secondary | ICD-10-CM | POA: Diagnosis not present

## 2017-06-15 DIAGNOSIS — I10 Essential (primary) hypertension: Secondary | ICD-10-CM | POA: Diagnosis not present

## 2017-06-15 DIAGNOSIS — E119 Type 2 diabetes mellitus without complications: Secondary | ICD-10-CM | POA: Diagnosis not present

## 2017-06-15 DIAGNOSIS — R630 Anorexia: Secondary | ICD-10-CM | POA: Diagnosis not present

## 2017-06-15 DIAGNOSIS — F329 Major depressive disorder, single episode, unspecified: Secondary | ICD-10-CM | POA: Diagnosis not present

## 2017-06-15 DIAGNOSIS — C719 Malignant neoplasm of brain, unspecified: Secondary | ICD-10-CM | POA: Diagnosis not present

## 2017-06-15 DIAGNOSIS — R634 Abnormal weight loss: Secondary | ICD-10-CM | POA: Diagnosis not present

## 2017-06-16 DIAGNOSIS — C719 Malignant neoplasm of brain, unspecified: Secondary | ICD-10-CM | POA: Diagnosis not present

## 2017-06-16 DIAGNOSIS — E119 Type 2 diabetes mellitus without complications: Secondary | ICD-10-CM | POA: Diagnosis not present

## 2017-06-16 DIAGNOSIS — R634 Abnormal weight loss: Secondary | ICD-10-CM | POA: Diagnosis not present

## 2017-06-16 DIAGNOSIS — R630 Anorexia: Secondary | ICD-10-CM | POA: Diagnosis not present

## 2017-06-16 DIAGNOSIS — F329 Major depressive disorder, single episode, unspecified: Secondary | ICD-10-CM | POA: Diagnosis not present

## 2017-06-16 DIAGNOSIS — I10 Essential (primary) hypertension: Secondary | ICD-10-CM | POA: Diagnosis not present

## 2017-06-18 DIAGNOSIS — R630 Anorexia: Secondary | ICD-10-CM | POA: Diagnosis not present

## 2017-06-18 DIAGNOSIS — C719 Malignant neoplasm of brain, unspecified: Secondary | ICD-10-CM | POA: Diagnosis not present

## 2017-06-18 DIAGNOSIS — F329 Major depressive disorder, single episode, unspecified: Secondary | ICD-10-CM | POA: Diagnosis not present

## 2017-06-18 DIAGNOSIS — R634 Abnormal weight loss: Secondary | ICD-10-CM | POA: Diagnosis not present

## 2017-06-18 DIAGNOSIS — E119 Type 2 diabetes mellitus without complications: Secondary | ICD-10-CM | POA: Diagnosis not present

## 2017-06-18 DIAGNOSIS — I10 Essential (primary) hypertension: Secondary | ICD-10-CM | POA: Diagnosis not present

## 2017-06-21 DIAGNOSIS — E119 Type 2 diabetes mellitus without complications: Secondary | ICD-10-CM | POA: Diagnosis not present

## 2017-06-21 DIAGNOSIS — R630 Anorexia: Secondary | ICD-10-CM | POA: Diagnosis not present

## 2017-06-21 DIAGNOSIS — F329 Major depressive disorder, single episode, unspecified: Secondary | ICD-10-CM | POA: Diagnosis not present

## 2017-06-21 DIAGNOSIS — C719 Malignant neoplasm of brain, unspecified: Secondary | ICD-10-CM | POA: Diagnosis not present

## 2017-06-21 DIAGNOSIS — I10 Essential (primary) hypertension: Secondary | ICD-10-CM | POA: Diagnosis not present

## 2017-06-21 DIAGNOSIS — R634 Abnormal weight loss: Secondary | ICD-10-CM | POA: Diagnosis not present

## 2017-06-23 DIAGNOSIS — C719 Malignant neoplasm of brain, unspecified: Secondary | ICD-10-CM | POA: Diagnosis not present

## 2017-06-23 DIAGNOSIS — I10 Essential (primary) hypertension: Secondary | ICD-10-CM | POA: Diagnosis not present

## 2017-06-23 DIAGNOSIS — R634 Abnormal weight loss: Secondary | ICD-10-CM | POA: Diagnosis not present

## 2017-06-23 DIAGNOSIS — R630 Anorexia: Secondary | ICD-10-CM | POA: Diagnosis not present

## 2017-06-23 DIAGNOSIS — F329 Major depressive disorder, single episode, unspecified: Secondary | ICD-10-CM | POA: Diagnosis not present

## 2017-06-23 DIAGNOSIS — E119 Type 2 diabetes mellitus without complications: Secondary | ICD-10-CM | POA: Diagnosis not present

## 2017-06-24 DIAGNOSIS — R630 Anorexia: Secondary | ICD-10-CM | POA: Diagnosis not present

## 2017-06-24 DIAGNOSIS — I10 Essential (primary) hypertension: Secondary | ICD-10-CM | POA: Diagnosis not present

## 2017-06-24 DIAGNOSIS — C719 Malignant neoplasm of brain, unspecified: Secondary | ICD-10-CM | POA: Diagnosis not present

## 2017-06-24 DIAGNOSIS — E119 Type 2 diabetes mellitus without complications: Secondary | ICD-10-CM | POA: Diagnosis not present

## 2017-06-24 DIAGNOSIS — R634 Abnormal weight loss: Secondary | ICD-10-CM | POA: Diagnosis not present

## 2017-06-24 DIAGNOSIS — F329 Major depressive disorder, single episode, unspecified: Secondary | ICD-10-CM | POA: Diagnosis not present

## 2017-06-25 DIAGNOSIS — R630 Anorexia: Secondary | ICD-10-CM | POA: Diagnosis not present

## 2017-06-25 DIAGNOSIS — E119 Type 2 diabetes mellitus without complications: Secondary | ICD-10-CM | POA: Diagnosis not present

## 2017-06-25 DIAGNOSIS — R634 Abnormal weight loss: Secondary | ICD-10-CM | POA: Diagnosis not present

## 2017-06-25 DIAGNOSIS — I10 Essential (primary) hypertension: Secondary | ICD-10-CM | POA: Diagnosis not present

## 2017-06-25 DIAGNOSIS — C719 Malignant neoplasm of brain, unspecified: Secondary | ICD-10-CM | POA: Diagnosis not present

## 2017-06-25 DIAGNOSIS — F329 Major depressive disorder, single episode, unspecified: Secondary | ICD-10-CM | POA: Diagnosis not present

## 2017-06-28 DIAGNOSIS — R630 Anorexia: Secondary | ICD-10-CM | POA: Diagnosis not present

## 2017-06-28 DIAGNOSIS — C719 Malignant neoplasm of brain, unspecified: Secondary | ICD-10-CM | POA: Diagnosis not present

## 2017-06-28 DIAGNOSIS — E119 Type 2 diabetes mellitus without complications: Secondary | ICD-10-CM | POA: Diagnosis not present

## 2017-06-28 DIAGNOSIS — F329 Major depressive disorder, single episode, unspecified: Secondary | ICD-10-CM | POA: Diagnosis not present

## 2017-06-28 DIAGNOSIS — R634 Abnormal weight loss: Secondary | ICD-10-CM | POA: Diagnosis not present

## 2017-06-28 DIAGNOSIS — I10 Essential (primary) hypertension: Secondary | ICD-10-CM | POA: Diagnosis not present

## 2017-06-29 DIAGNOSIS — R630 Anorexia: Secondary | ICD-10-CM | POA: Diagnosis not present

## 2017-06-29 DIAGNOSIS — C719 Malignant neoplasm of brain, unspecified: Secondary | ICD-10-CM | POA: Diagnosis not present

## 2017-06-29 DIAGNOSIS — F329 Major depressive disorder, single episode, unspecified: Secondary | ICD-10-CM | POA: Diagnosis not present

## 2017-06-29 DIAGNOSIS — E119 Type 2 diabetes mellitus without complications: Secondary | ICD-10-CM | POA: Diagnosis not present

## 2017-06-29 DIAGNOSIS — I10 Essential (primary) hypertension: Secondary | ICD-10-CM | POA: Diagnosis not present

## 2017-06-29 DIAGNOSIS — R634 Abnormal weight loss: Secondary | ICD-10-CM | POA: Diagnosis not present

## 2017-06-30 DIAGNOSIS — R630 Anorexia: Secondary | ICD-10-CM | POA: Diagnosis not present

## 2017-06-30 DIAGNOSIS — R634 Abnormal weight loss: Secondary | ICD-10-CM | POA: Diagnosis not present

## 2017-06-30 DIAGNOSIS — F329 Major depressive disorder, single episode, unspecified: Secondary | ICD-10-CM | POA: Diagnosis not present

## 2017-06-30 DIAGNOSIS — E119 Type 2 diabetes mellitus without complications: Secondary | ICD-10-CM | POA: Diagnosis not present

## 2017-06-30 DIAGNOSIS — C719 Malignant neoplasm of brain, unspecified: Secondary | ICD-10-CM | POA: Diagnosis not present

## 2017-06-30 DIAGNOSIS — I10 Essential (primary) hypertension: Secondary | ICD-10-CM | POA: Diagnosis not present

## 2017-07-04 DIAGNOSIS — C719 Malignant neoplasm of brain, unspecified: Secondary | ICD-10-CM | POA: Diagnosis not present

## 2017-07-04 DIAGNOSIS — I10 Essential (primary) hypertension: Secondary | ICD-10-CM | POA: Diagnosis not present

## 2017-07-04 DIAGNOSIS — E119 Type 2 diabetes mellitus without complications: Secondary | ICD-10-CM | POA: Diagnosis not present

## 2017-07-04 DIAGNOSIS — R630 Anorexia: Secondary | ICD-10-CM | POA: Diagnosis not present

## 2017-07-04 DIAGNOSIS — R634 Abnormal weight loss: Secondary | ICD-10-CM | POA: Diagnosis not present

## 2017-07-04 DIAGNOSIS — F329 Major depressive disorder, single episode, unspecified: Secondary | ICD-10-CM | POA: Diagnosis not present

## 2017-07-05 ENCOUNTER — Other Ambulatory Visit: Payer: Self-pay

## 2017-07-05 ENCOUNTER — Other Ambulatory Visit: Payer: Self-pay | Admitting: Internal Medicine

## 2017-07-05 ENCOUNTER — Other Ambulatory Visit: Payer: Self-pay | Admitting: Family Medicine

## 2017-07-05 ENCOUNTER — Inpatient Hospital Stay: Attending: Internal Medicine

## 2017-07-05 ENCOUNTER — Inpatient Hospital Stay (HOSPITAL_BASED_OUTPATIENT_CLINIC_OR_DEPARTMENT_OTHER): Admitting: Internal Medicine

## 2017-07-05 DIAGNOSIS — C719 Malignant neoplasm of brain, unspecified: Secondary | ICD-10-CM

## 2017-07-05 DIAGNOSIS — Z79899 Other long term (current) drug therapy: Secondary | ICD-10-CM | POA: Insufficient documentation

## 2017-07-05 DIAGNOSIS — C712 Malignant neoplasm of temporal lobe: Secondary | ICD-10-CM | POA: Insufficient documentation

## 2017-07-05 DIAGNOSIS — E2740 Unspecified adrenocortical insufficiency: Secondary | ICD-10-CM | POA: Diagnosis not present

## 2017-07-05 DIAGNOSIS — E890 Postprocedural hypothyroidism: Secondary | ICD-10-CM | POA: Insufficient documentation

## 2017-07-05 DIAGNOSIS — Z9221 Personal history of antineoplastic chemotherapy: Secondary | ICD-10-CM | POA: Diagnosis not present

## 2017-07-05 DIAGNOSIS — E1122 Type 2 diabetes mellitus with diabetic chronic kidney disease: Secondary | ICD-10-CM

## 2017-07-05 DIAGNOSIS — F329 Major depressive disorder, single episode, unspecified: Secondary | ICD-10-CM | POA: Insufficient documentation

## 2017-07-05 DIAGNOSIS — I10 Essential (primary) hypertension: Secondary | ICD-10-CM | POA: Diagnosis not present

## 2017-07-05 DIAGNOSIS — N183 Chronic kidney disease, stage 3 unspecified: Secondary | ICD-10-CM

## 2017-07-05 DIAGNOSIS — E119 Type 2 diabetes mellitus without complications: Secondary | ICD-10-CM | POA: Insufficient documentation

## 2017-07-05 DIAGNOSIS — Z923 Personal history of irradiation: Secondary | ICD-10-CM | POA: Diagnosis not present

## 2017-07-05 DIAGNOSIS — E785 Hyperlipidemia, unspecified: Secondary | ICD-10-CM | POA: Diagnosis not present

## 2017-07-05 DIAGNOSIS — Z87891 Personal history of nicotine dependence: Secondary | ICD-10-CM | POA: Diagnosis not present

## 2017-07-05 DIAGNOSIS — Z7984 Long term (current) use of oral hypoglycemic drugs: Secondary | ICD-10-CM | POA: Diagnosis not present

## 2017-07-05 DIAGNOSIS — Z85850 Personal history of malignant neoplasm of thyroid: Secondary | ICD-10-CM | POA: Diagnosis not present

## 2017-07-05 LAB — COMPREHENSIVE METABOLIC PANEL
ALK PHOS: 68 U/L (ref 38–126)
ALT: 28 U/L (ref 14–54)
ANION GAP: 11 (ref 5–15)
AST: 23 U/L (ref 15–41)
Albumin: 3.3 g/dL — ABNORMAL LOW (ref 3.5–5.0)
BILIRUBIN TOTAL: 0.6 mg/dL (ref 0.3–1.2)
BUN: 29 mg/dL — ABNORMAL HIGH (ref 6–20)
CALCIUM: 8.5 mg/dL — AB (ref 8.9–10.3)
CO2: 23 mmol/L (ref 22–32)
CREATININE: 1.15 mg/dL — AB (ref 0.44–1.00)
Chloride: 108 mmol/L (ref 101–111)
GFR calc non Af Amer: 47 mL/min — ABNORMAL LOW (ref >60)
GFR, EST AFRICAN AMERICAN: 54 mL/min — AB (ref >60)
Glucose, Bld: 188 mg/dL — ABNORMAL HIGH (ref 65–99)
Potassium: 3.3 mmol/L — ABNORMAL LOW (ref 3.5–5.1)
Sodium: 142 mmol/L (ref 135–145)
TOTAL PROTEIN: 5.7 g/dL — AB (ref 6.5–8.1)

## 2017-07-05 LAB — CBC WITH DIFFERENTIAL/PLATELET
BASOS ABS: 0.1 10*3/uL (ref 0–0.1)
BASOS PCT: 1 %
EOS ABS: 0.1 10*3/uL (ref 0–0.7)
Eosinophils Relative: 1 %
HCT: 34.5 % — ABNORMAL LOW (ref 35.0–47.0)
HEMOGLOBIN: 11.5 g/dL — AB (ref 12.0–16.0)
Lymphocytes Relative: 22 %
Lymphs Abs: 3.1 10*3/uL (ref 1.0–3.6)
MCH: 31.6 pg (ref 26.0–34.0)
MCHC: 33.4 g/dL (ref 32.0–36.0)
MCV: 94.3 fL (ref 80.0–100.0)
MONO ABS: 0.8 10*3/uL (ref 0.2–0.9)
Monocytes Relative: 6 %
NEUTROS ABS: 10 10*3/uL — AB (ref 1.4–6.5)
NEUTROS PCT: 70 %
PLATELETS: 237 10*3/uL (ref 150–440)
RBC: 3.65 MIL/uL — AB (ref 3.80–5.20)
RDW: 14.6 % — ABNORMAL HIGH (ref 11.5–14.5)
WBC: 14.2 10*3/uL — ABNORMAL HIGH (ref 3.6–11.0)

## 2017-07-05 NOTE — Assessment & Plan Note (Addendum)
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   #Given the overall declining performance status/patient preference/poor tolerance to therapy-patient currently on hospice.  Most recent MRI October 2018-stable. No obvious evidence of disease progression. Continue hospice.   # Adrenal insufficiency- continue steroid/dex at 1 mg daily.   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  # History of thyroid cancer- on Synthroid. TSH today 1.211. Continue Synthroid.   # follow up in 2 months/labs; sooner if needed.

## 2017-07-05 NOTE — Progress Notes (Signed)
[facility] OFFICE PROGRESS NOTE  Patient Care Team: Carmon Ginsberg, [place] as PCP - General (Family Medicine)  No matching staging information was found for the patient.   Oncology History   1. 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 (January, 2017; s/p resection Dr.Nundkumar; [place]) 2 starting radiation therapy and Temodar January of 2017 3.She has finished radiation therapy in April of 2017 and also finished Temodar for approximately 6 weeks 4.Patient started on maintenance Temodar from Jan 03, 2016; July 2017- MRI no recurrence; left hemisphere- whitish plaques ? 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    #Thyroid cancer s/p Thyroidectomy [s/p RAUI; 1967]     Glioblastoma determined by biopsy of brain ([place])   08/27/2015 Initial Diagnosis    Glioblastoma determined by biopsy of brain ([place])       Glioblastoma ([place]) (Resolved)    Initial Diagnosis    Glioblastoma ([place])       INTERVAL HISTORY:  Martha [name] 71 y.o.  female pleasant patient above history of GBM right frontal s/p resection; Status post adjuvant Temodar 8 months which was stopped in June 2018 d/t intolerance.   Patient currently on hospice at room home.  She has intermittent headaches; however improved with taking Tylenol.  No significant nausea vomiting.  Overall her appetite is poor.  She continues to have tremors in the bilateral upper extremities.    REVIEW OF SYSTEMS:  A complete 10 point review of system is done which is negative except mentioned above/history of present illness.   PAST MEDICAL HISTORY :  Past Medical History:  Diagnosis Date  . Cancer [facility])    thyroid cancer treated with radioactive iodine  . Depression   . Diabetes mellitus without complication ([place])   . Epilepsy ([place])   . Glioblastoma determined by biopsy of brain ([place])    . Hyperlipidemia   . Hypertension   . Thyroid disease    h/o thyroid ca- 30 years ago  . Varicose vein     PAST SURGICAL HISTORY :   Past Surgical History:  Procedure Laterality Date  . APPLICATION OF CRANIAL NAVIGATION N/A 08/02/2015   Procedure: APPLICATION OF CRANIAL NAVIGATION;  Surgeon: Consuella Lose, MD;  Location: [facility];  Service: Neurosurgery;  Laterality: N/A;  APPLICATION OF CRANIAL NAVIGATION  . BILATERAL CARPAL TUNNEL RELEASE  1990,1991   left hand in 1990 right hand in '91  . CERVICAL SPINE SURGERY  05/1997   spinal fusion  . CHOLECYSTECTOMY  04/24/2008   status post laparoscopic Dr.Ely  . CRANIOTOMY Right 08/02/2015   Procedure: CRANIOTOMY TUMOR EXCISION;  Surgeon: Consuella Lose, MD;  Location: [facility];  Service: Neurosurgery;  Laterality: Right;  CRANIOTOMY TUMOR EXCISION  . radioactive iodine    . THYROIDECTOMY, PARTIAL  1964   due to cancerous tumor  . TOE SURGERY Left 1989    FAMILY HISTORY :   Family History  Problem Relation Age of Onset  . Arthritis Mother   . Hyperlipidemia Mother   . Hypertension Mother   . Heart disease Mother   . Diabetes Mother        type 2  . Thyroid disease Mother   . Cancer Father        colon  . Alcohol abuse Brother   . Hyperlipidemia Brother   . Hypertension Brother   . Breast cancer  Neg Hx   . Ovarian cancer Neg Hx     SOCIAL HISTORY:   Social History   Tobacco Use  . Smoking status: Former Research scientist (life sciences)  . Smokeless tobacco: Never Used  . Tobacco comment: quit smoking in 2007  Substance Use Topics  . Alcohol use: No    Alcohol/week: 0.0 oz  . Drug use: No    ALLERGIES:  is allergic to ampicillin; fish-derived products; pravastatin; tomato; and penicillins.  MEDICATIONS:  Current Outpatient Medications  Medication Sig Dispense Refill  . amLODipine (NORVASC) 5 MG tablet Take 1 tablet (5 mg total) by mouth daily. 90 tablet 3  . dexamethasone (DECADRON) 2 MG tablet Take 2 mg by mouth daily.   4  . feeding supplement, ENSURE ENLIVE, (ENSURE ENLIVE) LIQD Take 237 mLs by mouth 3 (three) times daily between meals. 90 Bottle 0  . glipiZIDE (GLUCOTROL) 5 MG tablet TAKE 1 TABLET BY MOUTH TWICE A DAY 30 MINUTES BEFORE A MEAL 60 tablet 3  . levETIRAcetam (KEPPRA) 500 MG tablet Take 1 tablet (500 mg total) by mouth 2 (two) times daily. Reported on 12/11/2015 180 tablet 3  . levothyroxine (SYNTHROID, LEVOTHROID) 100 MCG tablet Take 1 tablet (100 mcg total) by mouth daily. 90 tablet 3  . lisinopril (PRINIVIL,ZESTRIL) 20 MG tablet Take 1 tablet by mouth daily.    . metoCLOPramide (REGLAN) 10 MG tablet Take 1 tablet by mouth as needed for nausea.  0  . promethazine (PHENERGAN) 25 MG tablet Take 1 tablet (25 mg total) by mouth every 6 (six) hours as needed for nausea or vomiting. (Patient not taking: Reported on 05/21/2017) 30 tablet 0   No current facility-administered medications for this visit.     PHYSICAL EXAMINATION: ECOG PERFORMANCE STATUS: 1 - Symptomatic but completely ambulatory  BP 116/81 (BP Location: Left Arm, Patient Position: Sitting)   Pulse 80   Temp (!) 96.8 F (36 C) (Tympanic)   Resp 18   Wt 158 lb 3.2 oz (71.8 kg)   BMI 29.89 kg/m   Filed Weights   07/05/17 1505  Weight: 158 lb 3.2 oz (71.8 kg)    GENERAL: Well-nourished well-developed; Alert. In wheelchair. Accompanied by friend. EYES: no pallor or icterus OROPHARYNX: no thrush or ulceration; good dentition  NECK: supple, no masses felt LYMPH:  no palpable lymphadenopathy in the cervical, axillary or inguinal regions LUNGS: clear to auscultation and  No wheeze or crackles HEART/CVS: regular rate & rhythm. no murmurs; No lower extremity edema ABDOMEN: abdomen soft, non-tender and normal bowel sounds Musculoskeletal: no cyanosis of digits and no clubbing  PSYCH: alert & oriented x 3 with fluent speech. Short term memory is poor. NEURO: no focal motor/sensory deficits; Mild action tremor of right hand.  SKIN: no  rashes or significant lesions  LABORATORY DATA:  I have reviewed the data as listed    Component Value Date/Time   NA 142 07/05/2017 1434   NA 148 (H) 11/14/2015 1623   NA 142 11/14/2014 1506   K 3.3 (L) 07/05/2017 1434   K 3.8 11/14/2014 1506   CL 108 07/05/2017 1434   CL 110 11/14/2014 1506   CO2 23 07/05/2017 1434   CO2 23 11/14/2014 1506   GLUCOSE 188 (H) 07/05/2017 1434   GLUCOSE 104 (H) 11/14/2014 1506   BUN 29 (H) 07/05/2017 1434   BUN 13 11/14/2015 1623   BUN 31 (H) 11/14/2014 1506   CREATININE 1.15 (H) 07/05/2017 1434   CREATININE 1.08 (H) 11/14/2014 1506   CALCIUM  8.5 (L) 07/05/2017 1434   CALCIUM 9.5 11/14/2014 1506   PROT 5.7 (L) 07/05/2017 1434   ALBUMIN 3.3 (L) 07/05/2017 1434   ALBUMIN 3.9 11/14/2015 1623   AST 23 07/05/2017 1434   ALT 28 07/05/2017 1434   ALKPHOS 68 07/05/2017 1434   BILITOT 0.6 07/05/2017 1434   GFRNONAA 47 (L) 07/05/2017 1434   GFRNONAA 53 (L) 11/14/2014 1506   GFRAA 54 (L) 07/05/2017 1434   GFRAA >60 11/14/2014 1506    No results found for: SPEP, UPEP  Lab Results  Component Value Date   WBC 14.2 (H) 07/05/2017   NEUTROABS 10.0 (H) 07/05/2017   HGB 11.5 (L) 07/05/2017   HCT 34.5 (L) 07/05/2017   MCV 94.3 07/05/2017   PLT 237 07/05/2017      Chemistry      Component Value Date/Time   NA 142 07/05/2017 1434   NA 148 (H) 11/14/2015 1623   NA 142 11/14/2014 1506   K 3.3 (L) 07/05/2017 1434   K 3.8 11/14/2014 1506   CL 108 07/05/2017 1434   CL 110 11/14/2014 1506   CO2 23 07/05/2017 1434   CO2 23 11/14/2014 1506   BUN 29 (H) 07/05/2017 1434   BUN 13 11/14/2015 1623   BUN 31 (H) 11/14/2014 1506   CREATININE 1.15 (H) 07/05/2017 1434   CREATININE 1.08 (H) 11/14/2014 1506      Component Value Date/Time   CALCIUM 8.5 (L) 07/05/2017 1434   CALCIUM 9.5 11/14/2014 1506   ALKPHOS 68 07/05/2017 1434   AST 23 07/05/2017 1434   ALT 28 07/05/2017 1434   BILITOT 0.6 07/05/2017 1434     MR BRAIN W WO CONTRAST  05/19/2017 IMPRESSION: 1. Resolved abnormal but nonspecific right thalamic and bilateral cerebellar hemisphere T2/FLAIR signal seen in July. 2. Stable post treatment appearance of the right temporal lobe and hemisphere, with no abnormal enhancement. 3. Continued stable appearance of unusual scattered left hemisphere calcified lesions thought related to treated thyroid metastases. 4. No new intracranial abnormality.   Electronically Signed   By: Genevie Ann M.D.   On: 05/19/2017 12:42  RADIOGRAPHIC STUDIES: I have personally reviewed the radiological images as listed and agreed with the findings in the report. No results found.   ASSESSMENT & PLAN:  Glioblastoma determined by biopsy of brain ([place]) Right temporal GBM- status post resection followed by chemoradiation; currently status post 8 adjuvant cycles [stopped Temodar in June 2018-secondary to intolerance].   #Given the overall declining performance status/patient preference/poor tolerance to therapy-patient currently on hospice.  Most recent MRI October 2018-stable. No obvious evidence of disease progression. Continue hospice.   # Adrenal insufficiency- continue steroid/dex at 1 mg daily.   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  # History of thyroid cancer- on Synthroid. TSH today 1.211. Continue Synthroid.   # follow up in 2 months/labs; sooner if needed.    Orders Placed This Encounter  Procedures  . CBC with Differential    Standing Status:   Future    Standing Expiration Date:   07/05/2018  . Comprehensive metabolic panel    Standing Status:   Future    Standing Expiration Date:   07/05/2018      Cammie Sickle, MD 07/05/2017 10:49 PM

## 2017-07-05 NOTE — Progress Notes (Signed)
Here for follow up. Has hospice team coming in several times per wk ( RN ,CNA and clergy )

## 2017-07-07 DIAGNOSIS — R630 Anorexia: Secondary | ICD-10-CM | POA: Diagnosis not present

## 2017-07-07 DIAGNOSIS — E119 Type 2 diabetes mellitus without complications: Secondary | ICD-10-CM | POA: Diagnosis not present

## 2017-07-07 DIAGNOSIS — I10 Essential (primary) hypertension: Secondary | ICD-10-CM | POA: Diagnosis not present

## 2017-07-07 DIAGNOSIS — R634 Abnormal weight loss: Secondary | ICD-10-CM | POA: Diagnosis not present

## 2017-07-07 DIAGNOSIS — F329 Major depressive disorder, single episode, unspecified: Secondary | ICD-10-CM | POA: Diagnosis not present

## 2017-07-07 DIAGNOSIS — C719 Malignant neoplasm of brain, unspecified: Secondary | ICD-10-CM | POA: Diagnosis not present

## 2017-07-09 DIAGNOSIS — F329 Major depressive disorder, single episode, unspecified: Secondary | ICD-10-CM | POA: Diagnosis not present

## 2017-07-09 DIAGNOSIS — C719 Malignant neoplasm of brain, unspecified: Secondary | ICD-10-CM | POA: Diagnosis not present

## 2017-07-09 DIAGNOSIS — R634 Abnormal weight loss: Secondary | ICD-10-CM | POA: Diagnosis not present

## 2017-07-09 DIAGNOSIS — R630 Anorexia: Secondary | ICD-10-CM | POA: Diagnosis not present

## 2017-07-09 DIAGNOSIS — E119 Type 2 diabetes mellitus without complications: Secondary | ICD-10-CM | POA: Diagnosis not present

## 2017-07-09 DIAGNOSIS — I10 Essential (primary) hypertension: Secondary | ICD-10-CM | POA: Diagnosis not present

## 2017-07-10 DIAGNOSIS — I10 Essential (primary) hypertension: Secondary | ICD-10-CM | POA: Diagnosis not present

## 2017-07-10 DIAGNOSIS — I8393 Asymptomatic varicose veins of bilateral lower extremities: Secondary | ICD-10-CM | POA: Diagnosis not present

## 2017-07-10 DIAGNOSIS — F329 Major depressive disorder, single episode, unspecified: Secondary | ICD-10-CM | POA: Diagnosis not present

## 2017-07-10 DIAGNOSIS — E2740 Unspecified adrenocortical insufficiency: Secondary | ICD-10-CM | POA: Diagnosis not present

## 2017-07-10 DIAGNOSIS — C719 Malignant neoplasm of brain, unspecified: Secondary | ICD-10-CM | POA: Diagnosis not present

## 2017-07-10 DIAGNOSIS — R634 Abnormal weight loss: Secondary | ICD-10-CM | POA: Diagnosis not present

## 2017-07-10 DIAGNOSIS — E785 Hyperlipidemia, unspecified: Secondary | ICD-10-CM | POA: Diagnosis not present

## 2017-07-10 DIAGNOSIS — Z85850 Personal history of malignant neoplasm of thyroid: Secondary | ICD-10-CM | POA: Diagnosis not present

## 2017-07-10 DIAGNOSIS — Z7984 Long term (current) use of oral hypoglycemic drugs: Secondary | ICD-10-CM | POA: Diagnosis not present

## 2017-07-10 DIAGNOSIS — R630 Anorexia: Secondary | ICD-10-CM | POA: Diagnosis not present

## 2017-07-10 DIAGNOSIS — E119 Type 2 diabetes mellitus without complications: Secondary | ICD-10-CM | POA: Diagnosis not present

## 2017-07-10 DIAGNOSIS — G252 Other specified forms of tremor: Secondary | ICD-10-CM | POA: Diagnosis not present

## 2017-07-12 DIAGNOSIS — F329 Major depressive disorder, single episode, unspecified: Secondary | ICD-10-CM | POA: Diagnosis not present

## 2017-07-12 DIAGNOSIS — E119 Type 2 diabetes mellitus without complications: Secondary | ICD-10-CM | POA: Diagnosis not present

## 2017-07-12 DIAGNOSIS — R630 Anorexia: Secondary | ICD-10-CM | POA: Diagnosis not present

## 2017-07-12 DIAGNOSIS — R634 Abnormal weight loss: Secondary | ICD-10-CM | POA: Diagnosis not present

## 2017-07-12 DIAGNOSIS — I10 Essential (primary) hypertension: Secondary | ICD-10-CM | POA: Diagnosis not present

## 2017-07-12 DIAGNOSIS — C719 Malignant neoplasm of brain, unspecified: Secondary | ICD-10-CM | POA: Diagnosis not present

## 2017-07-14 DIAGNOSIS — R634 Abnormal weight loss: Secondary | ICD-10-CM | POA: Diagnosis not present

## 2017-07-14 DIAGNOSIS — C719 Malignant neoplasm of brain, unspecified: Secondary | ICD-10-CM | POA: Diagnosis not present

## 2017-07-14 DIAGNOSIS — E119 Type 2 diabetes mellitus without complications: Secondary | ICD-10-CM | POA: Diagnosis not present

## 2017-07-14 DIAGNOSIS — R630 Anorexia: Secondary | ICD-10-CM | POA: Diagnosis not present

## 2017-07-14 DIAGNOSIS — I10 Essential (primary) hypertension: Secondary | ICD-10-CM | POA: Diagnosis not present

## 2017-07-14 DIAGNOSIS — F329 Major depressive disorder, single episode, unspecified: Secondary | ICD-10-CM | POA: Diagnosis not present

## 2017-07-16 DIAGNOSIS — E119 Type 2 diabetes mellitus without complications: Secondary | ICD-10-CM | POA: Diagnosis not present

## 2017-07-16 DIAGNOSIS — I10 Essential (primary) hypertension: Secondary | ICD-10-CM | POA: Diagnosis not present

## 2017-07-16 DIAGNOSIS — F329 Major depressive disorder, single episode, unspecified: Secondary | ICD-10-CM | POA: Diagnosis not present

## 2017-07-16 DIAGNOSIS — R630 Anorexia: Secondary | ICD-10-CM | POA: Diagnosis not present

## 2017-07-16 DIAGNOSIS — C719 Malignant neoplasm of brain, unspecified: Secondary | ICD-10-CM | POA: Diagnosis not present

## 2017-07-16 DIAGNOSIS — R634 Abnormal weight loss: Secondary | ICD-10-CM | POA: Diagnosis not present

## 2017-07-21 DIAGNOSIS — R630 Anorexia: Secondary | ICD-10-CM | POA: Diagnosis not present

## 2017-07-21 DIAGNOSIS — E119 Type 2 diabetes mellitus without complications: Secondary | ICD-10-CM | POA: Diagnosis not present

## 2017-07-21 DIAGNOSIS — I10 Essential (primary) hypertension: Secondary | ICD-10-CM | POA: Diagnosis not present

## 2017-07-21 DIAGNOSIS — F329 Major depressive disorder, single episode, unspecified: Secondary | ICD-10-CM | POA: Diagnosis not present

## 2017-07-21 DIAGNOSIS — R634 Abnormal weight loss: Secondary | ICD-10-CM | POA: Diagnosis not present

## 2017-07-21 DIAGNOSIS — C719 Malignant neoplasm of brain, unspecified: Secondary | ICD-10-CM | POA: Diagnosis not present

## 2017-07-23 DIAGNOSIS — R630 Anorexia: Secondary | ICD-10-CM | POA: Diagnosis not present

## 2017-07-23 DIAGNOSIS — I10 Essential (primary) hypertension: Secondary | ICD-10-CM | POA: Diagnosis not present

## 2017-07-23 DIAGNOSIS — F329 Major depressive disorder, single episode, unspecified: Secondary | ICD-10-CM | POA: Diagnosis not present

## 2017-07-23 DIAGNOSIS — E119 Type 2 diabetes mellitus without complications: Secondary | ICD-10-CM | POA: Diagnosis not present

## 2017-07-23 DIAGNOSIS — R634 Abnormal weight loss: Secondary | ICD-10-CM | POA: Diagnosis not present

## 2017-07-23 DIAGNOSIS — C719 Malignant neoplasm of brain, unspecified: Secondary | ICD-10-CM | POA: Diagnosis not present

## 2017-07-26 DIAGNOSIS — R634 Abnormal weight loss: Secondary | ICD-10-CM | POA: Diagnosis not present

## 2017-07-26 DIAGNOSIS — I10 Essential (primary) hypertension: Secondary | ICD-10-CM | POA: Diagnosis not present

## 2017-07-26 DIAGNOSIS — C719 Malignant neoplasm of brain, unspecified: Secondary | ICD-10-CM | POA: Diagnosis not present

## 2017-07-26 DIAGNOSIS — R630 Anorexia: Secondary | ICD-10-CM | POA: Diagnosis not present

## 2017-07-26 DIAGNOSIS — F329 Major depressive disorder, single episode, unspecified: Secondary | ICD-10-CM | POA: Diagnosis not present

## 2017-07-26 DIAGNOSIS — E119 Type 2 diabetes mellitus without complications: Secondary | ICD-10-CM | POA: Diagnosis not present

## 2017-07-28 DIAGNOSIS — R634 Abnormal weight loss: Secondary | ICD-10-CM | POA: Diagnosis not present

## 2017-07-28 DIAGNOSIS — E119 Type 2 diabetes mellitus without complications: Secondary | ICD-10-CM | POA: Diagnosis not present

## 2017-07-28 DIAGNOSIS — C719 Malignant neoplasm of brain, unspecified: Secondary | ICD-10-CM | POA: Diagnosis not present

## 2017-07-28 DIAGNOSIS — R630 Anorexia: Secondary | ICD-10-CM | POA: Diagnosis not present

## 2017-07-28 DIAGNOSIS — F329 Major depressive disorder, single episode, unspecified: Secondary | ICD-10-CM | POA: Diagnosis not present

## 2017-07-28 DIAGNOSIS — I10 Essential (primary) hypertension: Secondary | ICD-10-CM | POA: Diagnosis not present

## 2017-07-30 DIAGNOSIS — R634 Abnormal weight loss: Secondary | ICD-10-CM | POA: Diagnosis not present

## 2017-07-30 DIAGNOSIS — F329 Major depressive disorder, single episode, unspecified: Secondary | ICD-10-CM | POA: Diagnosis not present

## 2017-07-30 DIAGNOSIS — E119 Type 2 diabetes mellitus without complications: Secondary | ICD-10-CM | POA: Diagnosis not present

## 2017-07-30 DIAGNOSIS — R630 Anorexia: Secondary | ICD-10-CM | POA: Diagnosis not present

## 2017-07-30 DIAGNOSIS — C719 Malignant neoplasm of brain, unspecified: Secondary | ICD-10-CM | POA: Diagnosis not present

## 2017-07-30 DIAGNOSIS — I10 Essential (primary) hypertension: Secondary | ICD-10-CM | POA: Diagnosis not present

## 2017-08-02 DIAGNOSIS — R630 Anorexia: Secondary | ICD-10-CM | POA: Diagnosis not present

## 2017-08-02 DIAGNOSIS — E119 Type 2 diabetes mellitus without complications: Secondary | ICD-10-CM | POA: Diagnosis not present

## 2017-08-02 DIAGNOSIS — C719 Malignant neoplasm of brain, unspecified: Secondary | ICD-10-CM | POA: Diagnosis not present

## 2017-08-02 DIAGNOSIS — R634 Abnormal weight loss: Secondary | ICD-10-CM | POA: Diagnosis not present

## 2017-08-02 DIAGNOSIS — I10 Essential (primary) hypertension: Secondary | ICD-10-CM | POA: Diagnosis not present

## 2017-08-02 DIAGNOSIS — F329 Major depressive disorder, single episode, unspecified: Secondary | ICD-10-CM | POA: Diagnosis not present

## 2017-08-04 DIAGNOSIS — C719 Malignant neoplasm of brain, unspecified: Secondary | ICD-10-CM | POA: Diagnosis not present

## 2017-08-04 DIAGNOSIS — R634 Abnormal weight loss: Secondary | ICD-10-CM | POA: Diagnosis not present

## 2017-08-04 DIAGNOSIS — I10 Essential (primary) hypertension: Secondary | ICD-10-CM | POA: Diagnosis not present

## 2017-08-04 DIAGNOSIS — R630 Anorexia: Secondary | ICD-10-CM | POA: Diagnosis not present

## 2017-08-04 DIAGNOSIS — F329 Major depressive disorder, single episode, unspecified: Secondary | ICD-10-CM | POA: Diagnosis not present

## 2017-08-04 DIAGNOSIS — E119 Type 2 diabetes mellitus without complications: Secondary | ICD-10-CM | POA: Diagnosis not present

## 2017-08-05 DIAGNOSIS — R630 Anorexia: Secondary | ICD-10-CM | POA: Diagnosis not present

## 2017-08-05 DIAGNOSIS — R634 Abnormal weight loss: Secondary | ICD-10-CM | POA: Diagnosis not present

## 2017-08-05 DIAGNOSIS — I10 Essential (primary) hypertension: Secondary | ICD-10-CM | POA: Diagnosis not present

## 2017-08-05 DIAGNOSIS — C719 Malignant neoplasm of brain, unspecified: Secondary | ICD-10-CM | POA: Diagnosis not present

## 2017-08-05 DIAGNOSIS — F329 Major depressive disorder, single episode, unspecified: Secondary | ICD-10-CM | POA: Diagnosis not present

## 2017-08-05 DIAGNOSIS — E119 Type 2 diabetes mellitus without complications: Secondary | ICD-10-CM | POA: Diagnosis not present

## 2017-08-06 DIAGNOSIS — R634 Abnormal weight loss: Secondary | ICD-10-CM | POA: Diagnosis not present

## 2017-08-06 DIAGNOSIS — E119 Type 2 diabetes mellitus without complications: Secondary | ICD-10-CM | POA: Diagnosis not present

## 2017-08-06 DIAGNOSIS — I10 Essential (primary) hypertension: Secondary | ICD-10-CM | POA: Diagnosis not present

## 2017-08-06 DIAGNOSIS — F329 Major depressive disorder, single episode, unspecified: Secondary | ICD-10-CM | POA: Diagnosis not present

## 2017-08-06 DIAGNOSIS — R630 Anorexia: Secondary | ICD-10-CM | POA: Diagnosis not present

## 2017-08-06 DIAGNOSIS — C719 Malignant neoplasm of brain, unspecified: Secondary | ICD-10-CM | POA: Diagnosis not present

## 2017-08-09 DIAGNOSIS — R634 Abnormal weight loss: Secondary | ICD-10-CM | POA: Diagnosis not present

## 2017-08-09 DIAGNOSIS — R630 Anorexia: Secondary | ICD-10-CM | POA: Diagnosis not present

## 2017-08-09 DIAGNOSIS — F329 Major depressive disorder, single episode, unspecified: Secondary | ICD-10-CM | POA: Diagnosis not present

## 2017-08-09 DIAGNOSIS — E119 Type 2 diabetes mellitus without complications: Secondary | ICD-10-CM | POA: Diagnosis not present

## 2017-08-09 DIAGNOSIS — I10 Essential (primary) hypertension: Secondary | ICD-10-CM | POA: Diagnosis not present

## 2017-08-09 DIAGNOSIS — C719 Malignant neoplasm of brain, unspecified: Secondary | ICD-10-CM | POA: Diagnosis not present

## 2017-08-10 DIAGNOSIS — G252 Other specified forms of tremor: Secondary | ICD-10-CM | POA: Diagnosis not present

## 2017-08-10 DIAGNOSIS — E785 Hyperlipidemia, unspecified: Secondary | ICD-10-CM | POA: Diagnosis not present

## 2017-08-10 DIAGNOSIS — Z85850 Personal history of malignant neoplasm of thyroid: Secondary | ICD-10-CM | POA: Diagnosis not present

## 2017-08-10 DIAGNOSIS — E2740 Unspecified adrenocortical insufficiency: Secondary | ICD-10-CM | POA: Diagnosis not present

## 2017-08-10 DIAGNOSIS — E119 Type 2 diabetes mellitus without complications: Secondary | ICD-10-CM | POA: Diagnosis not present

## 2017-08-10 DIAGNOSIS — C719 Malignant neoplasm of brain, unspecified: Secondary | ICD-10-CM | POA: Diagnosis not present

## 2017-08-10 DIAGNOSIS — F329 Major depressive disorder, single episode, unspecified: Secondary | ICD-10-CM | POA: Diagnosis not present

## 2017-08-10 DIAGNOSIS — R630 Anorexia: Secondary | ICD-10-CM | POA: Diagnosis not present

## 2017-08-10 DIAGNOSIS — R634 Abnormal weight loss: Secondary | ICD-10-CM | POA: Diagnosis not present

## 2017-08-10 DIAGNOSIS — Z7984 Long term (current) use of oral hypoglycemic drugs: Secondary | ICD-10-CM | POA: Diagnosis not present

## 2017-08-10 DIAGNOSIS — I10 Essential (primary) hypertension: Secondary | ICD-10-CM | POA: Diagnosis not present

## 2017-08-10 DIAGNOSIS — I8393 Asymptomatic varicose veins of bilateral lower extremities: Secondary | ICD-10-CM | POA: Diagnosis not present

## 2017-08-13 DIAGNOSIS — E119 Type 2 diabetes mellitus without complications: Secondary | ICD-10-CM | POA: Diagnosis not present

## 2017-08-13 DIAGNOSIS — I10 Essential (primary) hypertension: Secondary | ICD-10-CM | POA: Diagnosis not present

## 2017-08-13 DIAGNOSIS — R634 Abnormal weight loss: Secondary | ICD-10-CM | POA: Diagnosis not present

## 2017-08-13 DIAGNOSIS — R630 Anorexia: Secondary | ICD-10-CM | POA: Diagnosis not present

## 2017-08-13 DIAGNOSIS — F329 Major depressive disorder, single episode, unspecified: Secondary | ICD-10-CM | POA: Diagnosis not present

## 2017-08-13 DIAGNOSIS — C719 Malignant neoplasm of brain, unspecified: Secondary | ICD-10-CM | POA: Diagnosis not present

## 2017-08-16 DIAGNOSIS — I10 Essential (primary) hypertension: Secondary | ICD-10-CM | POA: Diagnosis not present

## 2017-08-16 DIAGNOSIS — C719 Malignant neoplasm of brain, unspecified: Secondary | ICD-10-CM | POA: Diagnosis not present

## 2017-08-16 DIAGNOSIS — R634 Abnormal weight loss: Secondary | ICD-10-CM | POA: Diagnosis not present

## 2017-08-16 DIAGNOSIS — R630 Anorexia: Secondary | ICD-10-CM | POA: Diagnosis not present

## 2017-08-16 DIAGNOSIS — F329 Major depressive disorder, single episode, unspecified: Secondary | ICD-10-CM | POA: Diagnosis not present

## 2017-08-16 DIAGNOSIS — E119 Type 2 diabetes mellitus without complications: Secondary | ICD-10-CM | POA: Diagnosis not present

## 2017-08-18 DIAGNOSIS — C719 Malignant neoplasm of brain, unspecified: Secondary | ICD-10-CM | POA: Diagnosis not present

## 2017-08-18 DIAGNOSIS — E119 Type 2 diabetes mellitus without complications: Secondary | ICD-10-CM | POA: Diagnosis not present

## 2017-08-18 DIAGNOSIS — R630 Anorexia: Secondary | ICD-10-CM | POA: Diagnosis not present

## 2017-08-18 DIAGNOSIS — R634 Abnormal weight loss: Secondary | ICD-10-CM | POA: Diagnosis not present

## 2017-08-18 DIAGNOSIS — I10 Essential (primary) hypertension: Secondary | ICD-10-CM | POA: Diagnosis not present

## 2017-08-18 DIAGNOSIS — F329 Major depressive disorder, single episode, unspecified: Secondary | ICD-10-CM | POA: Diagnosis not present

## 2017-08-20 DIAGNOSIS — I10 Essential (primary) hypertension: Secondary | ICD-10-CM | POA: Diagnosis not present

## 2017-08-20 DIAGNOSIS — R630 Anorexia: Secondary | ICD-10-CM | POA: Diagnosis not present

## 2017-08-20 DIAGNOSIS — F329 Major depressive disorder, single episode, unspecified: Secondary | ICD-10-CM | POA: Diagnosis not present

## 2017-08-20 DIAGNOSIS — R634 Abnormal weight loss: Secondary | ICD-10-CM | POA: Diagnosis not present

## 2017-08-20 DIAGNOSIS — C719 Malignant neoplasm of brain, unspecified: Secondary | ICD-10-CM | POA: Diagnosis not present

## 2017-08-20 DIAGNOSIS — E119 Type 2 diabetes mellitus without complications: Secondary | ICD-10-CM | POA: Diagnosis not present

## 2017-08-23 DIAGNOSIS — C719 Malignant neoplasm of brain, unspecified: Secondary | ICD-10-CM | POA: Diagnosis not present

## 2017-08-23 DIAGNOSIS — R634 Abnormal weight loss: Secondary | ICD-10-CM | POA: Diagnosis not present

## 2017-08-23 DIAGNOSIS — R630 Anorexia: Secondary | ICD-10-CM | POA: Diagnosis not present

## 2017-08-23 DIAGNOSIS — I10 Essential (primary) hypertension: Secondary | ICD-10-CM | POA: Diagnosis not present

## 2017-08-23 DIAGNOSIS — F329 Major depressive disorder, single episode, unspecified: Secondary | ICD-10-CM | POA: Diagnosis not present

## 2017-08-23 DIAGNOSIS — E119 Type 2 diabetes mellitus without complications: Secondary | ICD-10-CM | POA: Diagnosis not present

## 2017-08-27 DIAGNOSIS — C719 Malignant neoplasm of brain, unspecified: Secondary | ICD-10-CM | POA: Diagnosis not present

## 2017-08-27 DIAGNOSIS — R630 Anorexia: Secondary | ICD-10-CM | POA: Diagnosis not present

## 2017-08-27 DIAGNOSIS — I10 Essential (primary) hypertension: Secondary | ICD-10-CM | POA: Diagnosis not present

## 2017-08-27 DIAGNOSIS — R634 Abnormal weight loss: Secondary | ICD-10-CM | POA: Diagnosis not present

## 2017-08-27 DIAGNOSIS — F329 Major depressive disorder, single episode, unspecified: Secondary | ICD-10-CM | POA: Diagnosis not present

## 2017-08-27 DIAGNOSIS — E119 Type 2 diabetes mellitus without complications: Secondary | ICD-10-CM | POA: Diagnosis not present

## 2017-08-30 DIAGNOSIS — I10 Essential (primary) hypertension: Secondary | ICD-10-CM | POA: Diagnosis not present

## 2017-08-30 DIAGNOSIS — E119 Type 2 diabetes mellitus without complications: Secondary | ICD-10-CM | POA: Diagnosis not present

## 2017-08-30 DIAGNOSIS — F329 Major depressive disorder, single episode, unspecified: Secondary | ICD-10-CM | POA: Diagnosis not present

## 2017-08-30 DIAGNOSIS — C719 Malignant neoplasm of brain, unspecified: Secondary | ICD-10-CM | POA: Diagnosis not present

## 2017-08-30 DIAGNOSIS — R630 Anorexia: Secondary | ICD-10-CM | POA: Diagnosis not present

## 2017-08-30 DIAGNOSIS — R634 Abnormal weight loss: Secondary | ICD-10-CM | POA: Diagnosis not present

## 2017-09-01 DIAGNOSIS — I10 Essential (primary) hypertension: Secondary | ICD-10-CM | POA: Diagnosis not present

## 2017-09-01 DIAGNOSIS — R634 Abnormal weight loss: Secondary | ICD-10-CM | POA: Diagnosis not present

## 2017-09-01 DIAGNOSIS — E119 Type 2 diabetes mellitus without complications: Secondary | ICD-10-CM | POA: Diagnosis not present

## 2017-09-01 DIAGNOSIS — C719 Malignant neoplasm of brain, unspecified: Secondary | ICD-10-CM | POA: Diagnosis not present

## 2017-09-01 DIAGNOSIS — F329 Major depressive disorder, single episode, unspecified: Secondary | ICD-10-CM | POA: Diagnosis not present

## 2017-09-01 DIAGNOSIS — R630 Anorexia: Secondary | ICD-10-CM | POA: Diagnosis not present

## 2017-09-03 DIAGNOSIS — C719 Malignant neoplasm of brain, unspecified: Secondary | ICD-10-CM | POA: Diagnosis not present

## 2017-09-03 DIAGNOSIS — R634 Abnormal weight loss: Secondary | ICD-10-CM | POA: Diagnosis not present

## 2017-09-03 DIAGNOSIS — R630 Anorexia: Secondary | ICD-10-CM | POA: Diagnosis not present

## 2017-09-03 DIAGNOSIS — F329 Major depressive disorder, single episode, unspecified: Secondary | ICD-10-CM | POA: Diagnosis not present

## 2017-09-03 DIAGNOSIS — I10 Essential (primary) hypertension: Secondary | ICD-10-CM | POA: Diagnosis not present

## 2017-09-03 DIAGNOSIS — E119 Type 2 diabetes mellitus without complications: Secondary | ICD-10-CM | POA: Diagnosis not present

## 2017-09-06 DIAGNOSIS — R630 Anorexia: Secondary | ICD-10-CM | POA: Diagnosis not present

## 2017-09-06 DIAGNOSIS — R634 Abnormal weight loss: Secondary | ICD-10-CM | POA: Diagnosis not present

## 2017-09-06 DIAGNOSIS — C719 Malignant neoplasm of brain, unspecified: Secondary | ICD-10-CM | POA: Diagnosis not present

## 2017-09-06 DIAGNOSIS — I10 Essential (primary) hypertension: Secondary | ICD-10-CM | POA: Diagnosis not present

## 2017-09-06 DIAGNOSIS — E119 Type 2 diabetes mellitus without complications: Secondary | ICD-10-CM | POA: Diagnosis not present

## 2017-09-06 DIAGNOSIS — F329 Major depressive disorder, single episode, unspecified: Secondary | ICD-10-CM | POA: Diagnosis not present

## 2017-09-07 DIAGNOSIS — E119 Type 2 diabetes mellitus without complications: Secondary | ICD-10-CM | POA: Diagnosis not present

## 2017-09-07 DIAGNOSIS — R630 Anorexia: Secondary | ICD-10-CM | POA: Diagnosis not present

## 2017-09-07 DIAGNOSIS — F329 Major depressive disorder, single episode, unspecified: Secondary | ICD-10-CM | POA: Diagnosis not present

## 2017-09-07 DIAGNOSIS — C719 Malignant neoplasm of brain, unspecified: Secondary | ICD-10-CM | POA: Diagnosis not present

## 2017-09-07 DIAGNOSIS — I10 Essential (primary) hypertension: Secondary | ICD-10-CM | POA: Diagnosis not present

## 2017-09-07 DIAGNOSIS — R634 Abnormal weight loss: Secondary | ICD-10-CM | POA: Diagnosis not present

## 2017-09-08 ENCOUNTER — Inpatient Hospital Stay

## 2017-09-08 ENCOUNTER — Inpatient Hospital Stay: Attending: Internal Medicine | Admitting: Internal Medicine

## 2017-09-08 VITALS — BP 107/5 | HR 94 | Temp 97.6°F | Resp 20

## 2017-09-08 DIAGNOSIS — C719 Malignant neoplasm of brain, unspecified: Secondary | ICD-10-CM | POA: Diagnosis not present

## 2017-09-08 DIAGNOSIS — E890 Postprocedural hypothyroidism: Secondary | ICD-10-CM | POA: Diagnosis not present

## 2017-09-08 DIAGNOSIS — Z79899 Other long term (current) drug therapy: Secondary | ICD-10-CM | POA: Diagnosis not present

## 2017-09-08 DIAGNOSIS — E785 Hyperlipidemia, unspecified: Secondary | ICD-10-CM | POA: Insufficient documentation

## 2017-09-08 DIAGNOSIS — Z85850 Personal history of malignant neoplasm of thyroid: Secondary | ICD-10-CM | POA: Insufficient documentation

## 2017-09-08 DIAGNOSIS — Z7984 Long term (current) use of oral hypoglycemic drugs: Secondary | ICD-10-CM | POA: Insufficient documentation

## 2017-09-08 DIAGNOSIS — R630 Anorexia: Secondary | ICD-10-CM | POA: Diagnosis not present

## 2017-09-08 DIAGNOSIS — Z923 Personal history of irradiation: Secondary | ICD-10-CM | POA: Diagnosis not present

## 2017-09-08 DIAGNOSIS — R634 Abnormal weight loss: Secondary | ICD-10-CM | POA: Diagnosis not present

## 2017-09-08 DIAGNOSIS — E2740 Unspecified adrenocortical insufficiency: Secondary | ICD-10-CM | POA: Diagnosis not present

## 2017-09-08 DIAGNOSIS — F329 Major depressive disorder, single episode, unspecified: Secondary | ICD-10-CM | POA: Diagnosis not present

## 2017-09-08 DIAGNOSIS — Z87891 Personal history of nicotine dependence: Secondary | ICD-10-CM | POA: Diagnosis not present

## 2017-09-08 DIAGNOSIS — I10 Essential (primary) hypertension: Secondary | ICD-10-CM | POA: Diagnosis not present

## 2017-09-08 DIAGNOSIS — E119 Type 2 diabetes mellitus without complications: Secondary | ICD-10-CM | POA: Diagnosis not present

## 2017-09-08 DIAGNOSIS — C712 Malignant neoplasm of temporal lobe: Secondary | ICD-10-CM | POA: Diagnosis not present

## 2017-09-08 LAB — COMPREHENSIVE METABOLIC PANEL
ALK PHOS: 64 U/L (ref 38–126)
ALT: 26 U/L (ref 14–54)
ANION GAP: 13 (ref 5–15)
AST: 18 U/L (ref 15–41)
Albumin: 3.5 g/dL (ref 3.5–5.0)
BILIRUBIN TOTAL: 0.7 mg/dL (ref 0.3–1.2)
BUN: 37 mg/dL — ABNORMAL HIGH (ref 6–20)
CALCIUM: 8.7 mg/dL — AB (ref 8.9–10.3)
CO2: 23 mmol/L (ref 22–32)
Chloride: 102 mmol/L (ref 101–111)
Creatinine, Ser: 1.34 mg/dL — ABNORMAL HIGH (ref 0.44–1.00)
GFR calc Af Amer: 45 mL/min — ABNORMAL LOW (ref >60)
GFR calc non Af Amer: 39 mL/min — ABNORMAL LOW (ref >60)
Glucose, Bld: 408 mg/dL — ABNORMAL HIGH (ref 65–99)
POTASSIUM: 4.3 mmol/L (ref 3.5–5.1)
SODIUM: 138 mmol/L (ref 135–145)
TOTAL PROTEIN: 6.2 g/dL — AB (ref 6.5–8.1)

## 2017-09-08 LAB — CBC WITH DIFFERENTIAL/PLATELET
Basophils Absolute: 0.1 10*3/uL (ref 0–0.1)
Basophils Relative: 1 %
EOS ABS: 0 10*3/uL (ref 0–0.7)
EOS PCT: 0 %
HCT: 38.8 % (ref 35.0–47.0)
Hemoglobin: 13.1 g/dL (ref 12.0–16.0)
LYMPHS ABS: 1.2 10*3/uL (ref 1.0–3.6)
LYMPHS PCT: 9 %
MCH: 32.2 pg (ref 26.0–34.0)
MCHC: 33.7 g/dL (ref 32.0–36.0)
MCV: 95.4 fL (ref 80.0–100.0)
MONO ABS: 0.3 10*3/uL (ref 0.2–0.9)
MONOS PCT: 2 %
NEUTROS ABS: 11.8 10*3/uL — AB (ref 1.4–6.5)
NEUTROS PCT: 88 %
PLATELETS: 298 10*3/uL (ref 150–440)
RBC: 4.06 MIL/uL (ref 3.80–5.20)
RDW: 13.6 % (ref 11.5–14.5)
WBC: 13.4 10*3/uL — ABNORMAL HIGH (ref 3.6–11.0)

## 2017-09-08 MED ORDER — METFORMIN HCL 500 MG PO TABS: 500.0000 mg | ORAL_TABLET | Freq: Two times a day (BID) | ORAL | 4 refills | Status: DC

## 2017-09-08 NOTE — Assessment & Plan Note (Addendum)
#   Right temporal GBM- status post resection followed by chemoradiation; currently status post 8 adjuvant cycles [stopped Temodar in June 2018-secondary to intolerance].   # Given the overall declining performance status/patient preference/poor tolerance to therapy-patient currently on hospice.  Most recent MRI October 2018-stable. No obvious evidence of disease progression.  Discussed regarding repeating MRI to assess the status of disease; currently hold off any imaging as this would not change the management at this time.  Continue hospice.   # Adrenal insufficiency-decrease dose of steroids/dex at 1 mg daily; however,elevated blood sugars [ see discussion below].  # Elevated Blood sugars- Blood sugars 400 today; recommend metformin; also order glucometer-hospice.  # History of thyroid cancer- on Synthroid. TSH today 1.211. Continue Synthroid.   # follow up in 2 months/labs; sooner if needed.

## 2017-09-09 DIAGNOSIS — R634 Abnormal weight loss: Secondary | ICD-10-CM | POA: Diagnosis not present

## 2017-09-09 DIAGNOSIS — C719 Malignant neoplasm of brain, unspecified: Secondary | ICD-10-CM | POA: Diagnosis not present

## 2017-09-09 DIAGNOSIS — I10 Essential (primary) hypertension: Secondary | ICD-10-CM | POA: Diagnosis not present

## 2017-09-09 DIAGNOSIS — R630 Anorexia: Secondary | ICD-10-CM | POA: Diagnosis not present

## 2017-09-09 DIAGNOSIS — E119 Type 2 diabetes mellitus without complications: Secondary | ICD-10-CM | POA: Diagnosis not present

## 2017-09-09 DIAGNOSIS — F329 Major depressive disorder, single episode, unspecified: Secondary | ICD-10-CM | POA: Diagnosis not present

## 2017-09-10 ENCOUNTER — Telehealth: Payer: Self-pay | Admitting: *Deleted

## 2017-09-10 DIAGNOSIS — E785 Hyperlipidemia, unspecified: Secondary | ICD-10-CM | POA: Diagnosis not present

## 2017-09-10 DIAGNOSIS — C719 Malignant neoplasm of brain, unspecified: Secondary | ICD-10-CM | POA: Diagnosis not present

## 2017-09-10 DIAGNOSIS — R630 Anorexia: Secondary | ICD-10-CM | POA: Diagnosis not present

## 2017-09-10 DIAGNOSIS — Z7984 Long term (current) use of oral hypoglycemic drugs: Secondary | ICD-10-CM | POA: Diagnosis not present

## 2017-09-10 DIAGNOSIS — G252 Other specified forms of tremor: Secondary | ICD-10-CM | POA: Diagnosis not present

## 2017-09-10 DIAGNOSIS — E119 Type 2 diabetes mellitus without complications: Secondary | ICD-10-CM | POA: Diagnosis not present

## 2017-09-10 DIAGNOSIS — Z85850 Personal history of malignant neoplasm of thyroid: Secondary | ICD-10-CM | POA: Diagnosis not present

## 2017-09-10 DIAGNOSIS — I8393 Asymptomatic varicose veins of bilateral lower extremities: Secondary | ICD-10-CM | POA: Diagnosis not present

## 2017-09-10 DIAGNOSIS — I10 Essential (primary) hypertension: Secondary | ICD-10-CM | POA: Diagnosis not present

## 2017-09-10 DIAGNOSIS — R634 Abnormal weight loss: Secondary | ICD-10-CM | POA: Diagnosis not present

## 2017-09-10 DIAGNOSIS — E2740 Unspecified adrenocortical insufficiency: Secondary | ICD-10-CM | POA: Diagnosis not present

## 2017-09-10 DIAGNOSIS — F329 Major depressive disorder, single episode, unspecified: Secondary | ICD-10-CM | POA: Diagnosis not present

## 2017-09-10 NOTE — Telephone Encounter (Signed)
I spoke with Otila Kluver and she states that they will try to get a fasting glucose next week. She reports that all of the patient 's medications are taken randomly, but will let Vivien Rota her regular nurse know to have her take the Metformin

## 2017-09-10 NOTE — Telephone Encounter (Signed)
Patient does not have family available to help and does not remember to check BS; her diet is sparse. Hospice is asking if starting Metformin and BS checks can wait to see if reducing steroids will bring her sugars down. Please advise

## 2017-09-10 NOTE — Telephone Encounter (Addendum)
That's okay if her blood sugars are not checked frequently; but needs to have Blood glucose checked at least some time next week/ atleast random on metformin-

## 2017-09-12 NOTE — Progress Notes (Signed)
Martha [name] OFFICE PROGRESS NOTE  Patient Care Team: Carmon Ginsberg, [place] as PCP - General (Family Medicine)  No matching staging information was found for the patient.   Oncology History   1. 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 (January, 2017; s/p resection Dr.Nundkumar; [place]) 2 starting radiation therapy and Temodar January of 2017 3.She has finished radiation therapy in April of 2017 and also finished Temodar for approximately 6 weeks 4.Patient started on maintenance Temodar from Jan 03, 2016; July 2017- MRI no recurrence; left hemisphere- whitish plaques ? 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    #Thyroid cancer s/p Thyroidectomy [s/p RAUI; 1967]     Glioblastoma determined by biopsy of brain ([place])   08/27/2015 Initial Diagnosis    Glioblastoma determined by biopsy of brain ([place])       Glioblastoma ([place]) (Resolved)    Initial Diagnosis    Glioblastoma ([place])       INTERVAL HISTORY: Patient is a poor historian.  Martha [name] 72 y.o.  female pleasant patient above history of GBM right frontal s/p resection; Status post adjuvant Temodar 8 months which was stopped in June 2018 d/t intolerance.   Patient continues to be in hospice.  She denies any new onset or progressive headaches.  Continues to have intermittent short-term memory problems.  No significant nausea vomiting.    She currently lives by herself.  She gets visits from hospice social worker regular basis.  There are concerns about compliance.  REVIEW OF SYSTEMS:  A complete 10 point review of system is done which is negative except mentioned above/history of present illness.   PAST MEDICAL HISTORY :  Past Medical History:  Diagnosis Date  . Cancer [facility])    thyroid cancer treated with radioactive iodine  . Depression   . Diabetes mellitus  without complication ([place])   . Epilepsy ([place])   . Glioblastoma determined by biopsy of brain ([place])   . Hyperlipidemia   . Hypertension   . Thyroid disease    h/o thyroid ca- 30 years ago  . Varicose vein     PAST SURGICAL HISTORY :   Past Surgical History:  Procedure Laterality Date  . APPLICATION OF CRANIAL NAVIGATION N/A 08/02/2015   Procedure: APPLICATION OF CRANIAL NAVIGATION;  Surgeon: Consuella Lose, MD;  Location: [place] [facility]URO ORS;  Service: Neurosurgery;  Laterality: N/A;  APPLICATION OF CRANIAL NAVIGATION  . BILATERAL CARPAL TUNNEL RELEASE  1990,1991   left hand in 1990 right hand in '91  . CERVICAL SPINE SURGERY  05/1997   spinal fusion  . CHOLECYSTECTOMY  04/24/2008   status post laparoscopic Dr.Ely  . CRANIOTOMY Right 08/02/2015   Procedure: CRANIOTOMY TUMOR EXCISION;  Surgeon: Consuella Lose, MD;  Location: [place] NEURO ORS;  Service: Neurosurgery;  Laterality: Right;  CRANIOTOMY TUMOR EXCISION  . radioactive iodine    . THYROIDECTOMY, PARTIAL  1964   due to cancerous tumor  . TOE SURGERY Left 1989    FAMILY HISTORY :   Family History  Problem Relation Age of Onset  . Arthritis Mother   . Hyperlipidemia Mother   . Hypertension Mother   . Heart disease Mother   . Diabetes Mother        type 2  . Thyroid disease Mother   . Cancer Father        colon  . Alcohol abuse  Brother   . Hyperlipidemia Brother   . Hypertension Brother   . Breast cancer Neg Hx   . Ovarian cancer Neg Hx     SOCIAL HISTORY:   Social History   Tobacco Use  . Smoking status: Former Research scientist (life sciences)  . Smokeless tobacco: Never Used  . Tobacco comment: quit smoking in 2007  Substance Use Topics  . Alcohol use: No    Alcohol/week: 0.0 oz  . Drug use: No    ALLERGIES:  is allergic to ampicillin; fish-derived products; pravastatin; tomato; and penicillins.  MEDICATIONS:  Current Outpatient Medications  Medication Sig Dispense Refill  . amLODipine (NORVASC) 5 MG tablet Take 1 tablet (5 mg  total) by mouth daily. 90 tablet 3  . feeding supplement, ENSURE ENLIVE, (ENSURE ENLIVE) LIQD Take 237 mLs by mouth 3 (three) times daily between meals. 90 Bottle 0  . glipiZIDE (GLUCOTROL) 5 MG tablet TAKE 1 TABLET BY MOUTH TWICE A DAY 30 MINUTES BEFORE A MEAL (Patient taking differently: TAKE 1 TABLET BY MOUTH once A DAY 30 MINUTES BEFORE A MEAL) 60 tablet 3  . levETIRAcetam (KEPPRA) 500 MG tablet Take 1 tablet (500 mg total) by mouth 2 (two) times daily. Reported on 12/11/2015 180 tablet 3  . levothyroxine (SYNTHROID, LEVOTHROID) 100 MCG tablet Take 1 tablet (100 mcg total) by mouth daily. 90 tablet 3  . lisinopril (PRINIVIL,ZESTRIL) 20 MG tablet Take 1 tablet by mouth daily.    . metFORMIN (GLUCOPHAGE) 500 MG tablet Take 1 tablet (500 mg total) by mouth 2 (two) times daily with a meal. 60 tablet 4   No current facility-administered medications for this visit.     PHYSICAL EXAMINATION: ECOG PERFORMANCE STATUS: 1 - Symptomatic but completely ambulatory  BP (!) 107/5   Pulse 94   Temp 97.6 F (36.4 C) (Tympanic)   Resp 20   There were no vitals filed for this visit.  GENERAL: Well-nourished well-developed; Alert. In wheelchair. Accompanied by friend. EYES: no pallor or icterus OROPHARYNX: no thrush or ulceration; good dentition  NECK: supple, no masses felt LYMPH:  no palpable lymphadenopathy in the cervical, axillary or inguinal regions LUNGS: clear to auscultation and  No wheeze or crackles HEART/CVS: regular rate & rhythm. no murmurs; No lower extremity edema ABDOMEN: abdomen soft, non-tender and normal bowel sounds Musculoskeletal: no cyanosis of digits and no clubbing  PSYCH: alert & oriented x 3 with fluent speech. Short term memory is poor. NEURO: no focal motor/sensory deficits; Mild action tremor of right hand.  SKIN: no rashes or significant lesions  LABORATORY DATA:  I have reviewed the data as listed    Component Value Date/Time   NA 138 09/08/2017 1410   NA 148 (H)  11/14/2015 1623   NA 142 11/14/2014 1506   K 4.3 09/08/2017 1410   K 3.8 11/14/2014 1506   CL 102 09/08/2017 1410   CL 110 11/14/2014 1506   CO2 23 09/08/2017 1410   CO2 23 11/14/2014 1506   GLUCOSE 408 (H) 09/08/2017 1410   GLUCOSE 104 (H) 11/14/2014 1506   BUN 37 (H) 09/08/2017 1410   BUN 13 11/14/2015 1623   BUN 31 (H) 11/14/2014 1506   CREATININE 1.34 (H) 09/08/2017 1410   CREATININE 1.08 (H) 11/14/2014 1506   CALCIUM 8.7 (L) 09/08/2017 1410   CALCIUM 9.5 11/14/2014 1506   PROT 6.2 (L) 09/08/2017 1410   ALBUMIN 3.5 09/08/2017 1410   ALBUMIN 3.9 11/14/2015 1623   AST 18 09/08/2017 1410   ALT 26 09/08/2017  1410   ALKPHOS 64 09/08/2017 1410   BILITOT 0.7 09/08/2017 1410   GFRNONAA 39 (L) 09/08/2017 1410   GFRNONAA 53 (L) 11/14/2014 1506   GFRAA 45 (L) 09/08/2017 1410   GFRAA >60 11/14/2014 1506    No results found for: SPEP, UPEP  Lab Results  Component Value Date   WBC 13.4 (H) 09/08/2017   NEUTROABS 11.8 (H) 09/08/2017   HGB 13.1 09/08/2017   HCT 38.8 09/08/2017   MCV 95.4 09/08/2017   PLT 298 09/08/2017      Chemistry      Component Value Date/Time   NA 138 09/08/2017 1410   NA 148 (H) 11/14/2015 1623   NA 142 11/14/2014 1506   K 4.3 09/08/2017 1410   K 3.8 11/14/2014 1506   CL 102 09/08/2017 1410   CL 110 11/14/2014 1506   CO2 23 09/08/2017 1410   CO2 23 11/14/2014 1506   BUN 37 (H) 09/08/2017 1410   BUN 13 11/14/2015 1623   BUN 31 (H) 11/14/2014 1506   CREATININE 1.34 (H) 09/08/2017 1410   CREATININE 1.08 (H) 11/14/2014 1506      Component Value Date/Time   CALCIUM 8.7 (L) 09/08/2017 1410   CALCIUM 9.5 11/14/2014 1506   ALKPHOS 64 09/08/2017 1410   AST 18 09/08/2017 1410   ALT 26 09/08/2017 1410   BILITOT 0.7 09/08/2017 1410     MR BRAIN W WO CONTRAST 05/19/2017 IMPRESSION: 1. Resolved abnormal but nonspecific right thalamic and bilateral cerebellar hemisphere T2/FLAIR signal seen in July. 2. Stable post treatment appearance of the right  temporal lobe and hemisphere, with no abnormal enhancement. 3. Continued stable appearance of unusual scattered left hemisphere calcified lesions thought related to treated thyroid metastases. 4. No new intracranial abnormality.   Electronically Signed   By: Genevie Ann M.D.   On: 05/19/2017 12:42  RADIOGRAPHIC STUDIES: I have personally reviewed the radiological images as listed and agreed with the findings in the report. No results found.   ASSESSMENT & PLAN:  Glioblastoma determined by biopsy of brain ([place]) Right temporal GBM- status post resection followed by chemoradiation; currently status post 8 adjuvant cycles [stopped Temodar in June 2018-secondary to intolerance].   #Given the overall declining performance status/patient preference/poor tolerance to therapy-patient currently on hospice.  Most recent MRI October 2018-stable. No obvious evidence of disease progression. Continue hospice.   # Adrenal insufficiency- continue steroid/dex at 1 mg daily.  # Elevated Blood suagrs- [[place]oni]; only am medications; order glucometer; and   # Action tremor- sec to steroids; discussed referral to neurology; patient declines.  # History of thyroid cancer- on Synthroid. TSH today 1.211. Continue Synthroid.   # follow up in 2 months/labs; sooner if needed. ..    Orders Placed This Encounter  Procedures  . CBC with Differential/Platelet    Standing Status:   Future    Standing Expiration Date:   09/08/2018  . Comprehensive metabolic panel    Standing Status:   Future    Standing Expiration Date:   09/08/2018      Cammie Sickle, MD 09/12/2017 8:20 PM

## 2017-09-13 DIAGNOSIS — E119 Type 2 diabetes mellitus without complications: Secondary | ICD-10-CM | POA: Diagnosis not present

## 2017-09-13 DIAGNOSIS — R630 Anorexia: Secondary | ICD-10-CM | POA: Diagnosis not present

## 2017-09-13 DIAGNOSIS — R634 Abnormal weight loss: Secondary | ICD-10-CM | POA: Diagnosis not present

## 2017-09-13 DIAGNOSIS — F329 Major depressive disorder, single episode, unspecified: Secondary | ICD-10-CM | POA: Diagnosis not present

## 2017-09-13 DIAGNOSIS — C719 Malignant neoplasm of brain, unspecified: Secondary | ICD-10-CM | POA: Diagnosis not present

## 2017-09-13 DIAGNOSIS — I10 Essential (primary) hypertension: Secondary | ICD-10-CM | POA: Diagnosis not present

## 2017-09-14 DIAGNOSIS — R630 Anorexia: Secondary | ICD-10-CM | POA: Diagnosis not present

## 2017-09-14 DIAGNOSIS — I10 Essential (primary) hypertension: Secondary | ICD-10-CM | POA: Diagnosis not present

## 2017-09-14 DIAGNOSIS — R634 Abnormal weight loss: Secondary | ICD-10-CM | POA: Diagnosis not present

## 2017-09-14 DIAGNOSIS — C719 Malignant neoplasm of brain, unspecified: Secondary | ICD-10-CM | POA: Diagnosis not present

## 2017-09-14 DIAGNOSIS — E119 Type 2 diabetes mellitus without complications: Secondary | ICD-10-CM | POA: Diagnosis not present

## 2017-09-14 DIAGNOSIS — F329 Major depressive disorder, single episode, unspecified: Secondary | ICD-10-CM | POA: Diagnosis not present

## 2017-09-15 DIAGNOSIS — C719 Malignant neoplasm of brain, unspecified: Secondary | ICD-10-CM | POA: Diagnosis not present

## 2017-09-15 DIAGNOSIS — I10 Essential (primary) hypertension: Secondary | ICD-10-CM | POA: Diagnosis not present

## 2017-09-15 DIAGNOSIS — F329 Major depressive disorder, single episode, unspecified: Secondary | ICD-10-CM | POA: Diagnosis not present

## 2017-09-15 DIAGNOSIS — R634 Abnormal weight loss: Secondary | ICD-10-CM | POA: Diagnosis not present

## 2017-09-15 DIAGNOSIS — E119 Type 2 diabetes mellitus without complications: Secondary | ICD-10-CM | POA: Diagnosis not present

## 2017-09-15 DIAGNOSIS — R630 Anorexia: Secondary | ICD-10-CM | POA: Diagnosis not present

## 2017-09-16 ENCOUNTER — Telehealth: Payer: Self-pay | Admitting: *Deleted

## 2017-09-16 DIAGNOSIS — E119 Type 2 diabetes mellitus without complications: Secondary | ICD-10-CM | POA: Diagnosis not present

## 2017-09-16 DIAGNOSIS — R634 Abnormal weight loss: Secondary | ICD-10-CM | POA: Diagnosis not present

## 2017-09-16 DIAGNOSIS — F329 Major depressive disorder, single episode, unspecified: Secondary | ICD-10-CM | POA: Diagnosis not present

## 2017-09-16 DIAGNOSIS — C719 Malignant neoplasm of brain, unspecified: Secondary | ICD-10-CM | POA: Diagnosis not present

## 2017-09-16 DIAGNOSIS — I10 Essential (primary) hypertension: Secondary | ICD-10-CM | POA: Diagnosis not present

## 2017-09-16 DIAGNOSIS — R630 Anorexia: Secondary | ICD-10-CM | POA: Diagnosis not present

## 2017-09-16 NOTE — Telephone Encounter (Signed)
Call returned to [facility] and advised per MB Thompson that we do not have the readings, She will have them refaxed to my direct fax #

## 2017-09-16 NOTE — Telephone Encounter (Signed)
Tina with hospice called to inquire if we received the copy of Martha [name]'s glucose readings and what to do regarding the diabetes medicine with the low fasting results. Please advise

## 2017-09-17 DIAGNOSIS — C719 Malignant neoplasm of brain, unspecified: Secondary | ICD-10-CM | POA: Diagnosis not present

## 2017-09-17 DIAGNOSIS — R634 Abnormal weight loss: Secondary | ICD-10-CM | POA: Diagnosis not present

## 2017-09-17 DIAGNOSIS — I10 Essential (primary) hypertension: Secondary | ICD-10-CM | POA: Diagnosis not present

## 2017-09-17 DIAGNOSIS — E119 Type 2 diabetes mellitus without complications: Secondary | ICD-10-CM | POA: Diagnosis not present

## 2017-09-17 DIAGNOSIS — F329 Major depressive disorder, single episode, unspecified: Secondary | ICD-10-CM | POA: Diagnosis not present

## 2017-09-17 DIAGNOSIS — R630 Anorexia: Secondary | ICD-10-CM | POA: Diagnosis not present

## 2017-09-17 NOTE — Telephone Encounter (Signed)
Per Dr. Jacinto Reap - hold metformin & recheck blood sugars in 2 days.  Please have hospice fax the results.

## 2017-09-17 NOTE — Telephone Encounter (Signed)
Patient has not been taking Metformin, she is not taking her medications right most of the time , but she has been taking Glipizide fairly regularly, so discussed to hold Glipizde and recheck Glu Monday and fax results to [place]

## 2017-09-20 DIAGNOSIS — F329 Major depressive disorder, single episode, unspecified: Secondary | ICD-10-CM | POA: Diagnosis not present

## 2017-09-20 DIAGNOSIS — C719 Malignant neoplasm of brain, unspecified: Secondary | ICD-10-CM | POA: Diagnosis not present

## 2017-09-20 DIAGNOSIS — E119 Type 2 diabetes mellitus without complications: Secondary | ICD-10-CM | POA: Diagnosis not present

## 2017-09-20 DIAGNOSIS — R630 Anorexia: Secondary | ICD-10-CM | POA: Diagnosis not present

## 2017-09-20 DIAGNOSIS — R634 Abnormal weight loss: Secondary | ICD-10-CM | POA: Diagnosis not present

## 2017-09-20 DIAGNOSIS — I10 Essential (primary) hypertension: Secondary | ICD-10-CM | POA: Diagnosis not present

## 2017-09-21 ENCOUNTER — Other Ambulatory Visit: Payer: Self-pay | Admitting: *Deleted

## 2017-09-21 ENCOUNTER — Other Ambulatory Visit: Payer: Self-pay | Admitting: Internal Medicine

## 2017-09-21 DIAGNOSIS — C719 Malignant neoplasm of brain, unspecified: Secondary | ICD-10-CM | POA: Diagnosis not present

## 2017-09-21 DIAGNOSIS — R634 Abnormal weight loss: Secondary | ICD-10-CM | POA: Diagnosis not present

## 2017-09-21 DIAGNOSIS — I10 Essential (primary) hypertension: Secondary | ICD-10-CM | POA: Diagnosis not present

## 2017-09-21 DIAGNOSIS — F329 Major depressive disorder, single episode, unspecified: Secondary | ICD-10-CM | POA: Diagnosis not present

## 2017-09-21 DIAGNOSIS — E119 Type 2 diabetes mellitus without complications: Secondary | ICD-10-CM | POA: Diagnosis not present

## 2017-09-21 DIAGNOSIS — R630 Anorexia: Secondary | ICD-10-CM | POA: Diagnosis not present

## 2017-09-21 MED ORDER — DEXAMETHASONE 1 MG PO TABS: 1.0000 mg | ORAL_TABLET | Freq: Every day | ORAL | 3 refills | Status: DC

## 2017-09-22 ENCOUNTER — Telehealth: Payer: Self-pay | Admitting: *Deleted

## 2017-09-22 DIAGNOSIS — C719 Malignant neoplasm of brain, unspecified: Secondary | ICD-10-CM | POA: Diagnosis not present

## 2017-09-22 DIAGNOSIS — R630 Anorexia: Secondary | ICD-10-CM | POA: Diagnosis not present

## 2017-09-22 DIAGNOSIS — I10 Essential (primary) hypertension: Secondary | ICD-10-CM | POA: Diagnosis not present

## 2017-09-22 DIAGNOSIS — E119 Type 2 diabetes mellitus without complications: Secondary | ICD-10-CM | POA: Diagnosis not present

## 2017-09-22 DIAGNOSIS — R634 Abnormal weight loss: Secondary | ICD-10-CM | POA: Diagnosis not present

## 2017-09-22 DIAGNOSIS — F329 Major depressive disorder, single episode, unspecified: Secondary | ICD-10-CM | POA: Diagnosis not present

## 2017-09-22 NOTE — Telephone Encounter (Signed)
Hospice nurse Vivien Rota called asking if we received Glu level of 150. Asking what we want to do about her Glipizide which is on hold. Please advise

## 2017-09-22 NOTE — Telephone Encounter (Signed)
The lab results were in the MD box this am. I will inform Dr. B so that he can provide further instructions.

## 2017-09-22 NOTE — Telephone Encounter (Signed)
Left message on Toni's voice mail per VO Dr B to go back on Glipizide

## 2017-09-23 DIAGNOSIS — I10 Essential (primary) hypertension: Secondary | ICD-10-CM | POA: Diagnosis not present

## 2017-09-23 DIAGNOSIS — R634 Abnormal weight loss: Secondary | ICD-10-CM | POA: Diagnosis not present

## 2017-09-23 DIAGNOSIS — C719 Malignant neoplasm of brain, unspecified: Secondary | ICD-10-CM | POA: Diagnosis not present

## 2017-09-23 DIAGNOSIS — E119 Type 2 diabetes mellitus without complications: Secondary | ICD-10-CM | POA: Diagnosis not present

## 2017-09-23 DIAGNOSIS — R630 Anorexia: Secondary | ICD-10-CM | POA: Diagnosis not present

## 2017-09-23 DIAGNOSIS — F329 Major depressive disorder, single episode, unspecified: Secondary | ICD-10-CM | POA: Diagnosis not present

## 2017-09-27 DIAGNOSIS — F329 Major depressive disorder, single episode, unspecified: Secondary | ICD-10-CM | POA: Diagnosis not present

## 2017-09-27 DIAGNOSIS — I10 Essential (primary) hypertension: Secondary | ICD-10-CM | POA: Diagnosis not present

## 2017-09-27 DIAGNOSIS — E119 Type 2 diabetes mellitus without complications: Secondary | ICD-10-CM | POA: Diagnosis not present

## 2017-09-27 DIAGNOSIS — R634 Abnormal weight loss: Secondary | ICD-10-CM | POA: Diagnosis not present

## 2017-09-27 DIAGNOSIS — R630 Anorexia: Secondary | ICD-10-CM | POA: Diagnosis not present

## 2017-09-27 DIAGNOSIS — C719 Malignant neoplasm of brain, unspecified: Secondary | ICD-10-CM | POA: Diagnosis not present

## 2017-09-28 DIAGNOSIS — R630 Anorexia: Secondary | ICD-10-CM | POA: Diagnosis not present

## 2017-09-28 DIAGNOSIS — F329 Major depressive disorder, single episode, unspecified: Secondary | ICD-10-CM | POA: Diagnosis not present

## 2017-09-28 DIAGNOSIS — I10 Essential (primary) hypertension: Secondary | ICD-10-CM | POA: Diagnosis not present

## 2017-09-28 DIAGNOSIS — C719 Malignant neoplasm of brain, unspecified: Secondary | ICD-10-CM | POA: Diagnosis not present

## 2017-09-28 DIAGNOSIS — E119 Type 2 diabetes mellitus without complications: Secondary | ICD-10-CM | POA: Diagnosis not present

## 2017-09-28 DIAGNOSIS — R634 Abnormal weight loss: Secondary | ICD-10-CM | POA: Diagnosis not present

## 2017-09-29 DIAGNOSIS — F329 Major depressive disorder, single episode, unspecified: Secondary | ICD-10-CM | POA: Diagnosis not present

## 2017-09-29 DIAGNOSIS — I10 Essential (primary) hypertension: Secondary | ICD-10-CM | POA: Diagnosis not present

## 2017-09-29 DIAGNOSIS — R630 Anorexia: Secondary | ICD-10-CM | POA: Diagnosis not present

## 2017-09-29 DIAGNOSIS — E119 Type 2 diabetes mellitus without complications: Secondary | ICD-10-CM | POA: Diagnosis not present

## 2017-09-29 DIAGNOSIS — R634 Abnormal weight loss: Secondary | ICD-10-CM | POA: Diagnosis not present

## 2017-09-29 DIAGNOSIS — C719 Malignant neoplasm of brain, unspecified: Secondary | ICD-10-CM | POA: Diagnosis not present

## 2017-09-30 DIAGNOSIS — C719 Malignant neoplasm of brain, unspecified: Secondary | ICD-10-CM | POA: Diagnosis not present

## 2017-09-30 DIAGNOSIS — F329 Major depressive disorder, single episode, unspecified: Secondary | ICD-10-CM | POA: Diagnosis not present

## 2017-09-30 DIAGNOSIS — R630 Anorexia: Secondary | ICD-10-CM | POA: Diagnosis not present

## 2017-09-30 DIAGNOSIS — E119 Type 2 diabetes mellitus without complications: Secondary | ICD-10-CM | POA: Diagnosis not present

## 2017-09-30 DIAGNOSIS — R634 Abnormal weight loss: Secondary | ICD-10-CM | POA: Diagnosis not present

## 2017-09-30 DIAGNOSIS — I10 Essential (primary) hypertension: Secondary | ICD-10-CM | POA: Diagnosis not present

## 2017-10-01 ENCOUNTER — Other Ambulatory Visit: Payer: Self-pay | Admitting: Internal Medicine

## 2017-10-01 DIAGNOSIS — R630 Anorexia: Secondary | ICD-10-CM | POA: Diagnosis not present

## 2017-10-01 DIAGNOSIS — I10 Essential (primary) hypertension: Secondary | ICD-10-CM | POA: Diagnosis not present

## 2017-10-01 DIAGNOSIS — C719 Malignant neoplasm of brain, unspecified: Secondary | ICD-10-CM | POA: Diagnosis not present

## 2017-10-01 DIAGNOSIS — F329 Major depressive disorder, single episode, unspecified: Secondary | ICD-10-CM | POA: Diagnosis not present

## 2017-10-01 DIAGNOSIS — E119 Type 2 diabetes mellitus without complications: Secondary | ICD-10-CM | POA: Diagnosis not present

## 2017-10-01 DIAGNOSIS — R634 Abnormal weight loss: Secondary | ICD-10-CM | POA: Diagnosis not present

## 2017-10-04 DIAGNOSIS — I10 Essential (primary) hypertension: Secondary | ICD-10-CM | POA: Diagnosis not present

## 2017-10-04 DIAGNOSIS — E119 Type 2 diabetes mellitus without complications: Secondary | ICD-10-CM | POA: Diagnosis not present

## 2017-10-04 DIAGNOSIS — F329 Major depressive disorder, single episode, unspecified: Secondary | ICD-10-CM | POA: Diagnosis not present

## 2017-10-04 DIAGNOSIS — R630 Anorexia: Secondary | ICD-10-CM | POA: Diagnosis not present

## 2017-10-04 DIAGNOSIS — R634 Abnormal weight loss: Secondary | ICD-10-CM | POA: Diagnosis not present

## 2017-10-04 DIAGNOSIS — C719 Malignant neoplasm of brain, unspecified: Secondary | ICD-10-CM | POA: Diagnosis not present

## 2017-10-05 DIAGNOSIS — I10 Essential (primary) hypertension: Secondary | ICD-10-CM | POA: Diagnosis not present

## 2017-10-05 DIAGNOSIS — F329 Major depressive disorder, single episode, unspecified: Secondary | ICD-10-CM | POA: Diagnosis not present

## 2017-10-05 DIAGNOSIS — R630 Anorexia: Secondary | ICD-10-CM | POA: Diagnosis not present

## 2017-10-05 DIAGNOSIS — E119 Type 2 diabetes mellitus without complications: Secondary | ICD-10-CM | POA: Diagnosis not present

## 2017-10-05 DIAGNOSIS — C719 Malignant neoplasm of brain, unspecified: Secondary | ICD-10-CM | POA: Diagnosis not present

## 2017-10-05 DIAGNOSIS — R634 Abnormal weight loss: Secondary | ICD-10-CM | POA: Diagnosis not present

## 2017-10-06 DIAGNOSIS — I10 Essential (primary) hypertension: Secondary | ICD-10-CM | POA: Diagnosis not present

## 2017-10-06 DIAGNOSIS — E119 Type 2 diabetes mellitus without complications: Secondary | ICD-10-CM | POA: Diagnosis not present

## 2017-10-06 DIAGNOSIS — C719 Malignant neoplasm of brain, unspecified: Secondary | ICD-10-CM | POA: Diagnosis not present

## 2017-10-06 DIAGNOSIS — F329 Major depressive disorder, single episode, unspecified: Secondary | ICD-10-CM | POA: Diagnosis not present

## 2017-10-06 DIAGNOSIS — R630 Anorexia: Secondary | ICD-10-CM | POA: Diagnosis not present

## 2017-10-06 DIAGNOSIS — R634 Abnormal weight loss: Secondary | ICD-10-CM | POA: Diagnosis not present

## 2017-10-07 DIAGNOSIS — F329 Major depressive disorder, single episode, unspecified: Secondary | ICD-10-CM | POA: Diagnosis not present

## 2017-10-07 DIAGNOSIS — R630 Anorexia: Secondary | ICD-10-CM | POA: Diagnosis not present

## 2017-10-07 DIAGNOSIS — C719 Malignant neoplasm of brain, unspecified: Secondary | ICD-10-CM | POA: Diagnosis not present

## 2017-10-07 DIAGNOSIS — R634 Abnormal weight loss: Secondary | ICD-10-CM | POA: Diagnosis not present

## 2017-10-07 DIAGNOSIS — I10 Essential (primary) hypertension: Secondary | ICD-10-CM | POA: Diagnosis not present

## 2017-10-07 DIAGNOSIS — E119 Type 2 diabetes mellitus without complications: Secondary | ICD-10-CM | POA: Diagnosis not present

## 2017-10-08 DIAGNOSIS — R634 Abnormal weight loss: Secondary | ICD-10-CM | POA: Diagnosis not present

## 2017-10-08 DIAGNOSIS — E119 Type 2 diabetes mellitus without complications: Secondary | ICD-10-CM | POA: Diagnosis not present

## 2017-10-08 DIAGNOSIS — E2740 Unspecified adrenocortical insufficiency: Secondary | ICD-10-CM | POA: Diagnosis not present

## 2017-10-08 DIAGNOSIS — I8393 Asymptomatic varicose veins of bilateral lower extremities: Secondary | ICD-10-CM | POA: Diagnosis not present

## 2017-10-08 DIAGNOSIS — Z85850 Personal history of malignant neoplasm of thyroid: Secondary | ICD-10-CM | POA: Diagnosis not present

## 2017-10-08 DIAGNOSIS — G252 Other specified forms of tremor: Secondary | ICD-10-CM | POA: Diagnosis not present

## 2017-10-08 DIAGNOSIS — R630 Anorexia: Secondary | ICD-10-CM | POA: Diagnosis not present

## 2017-10-08 DIAGNOSIS — I10 Essential (primary) hypertension: Secondary | ICD-10-CM | POA: Diagnosis not present

## 2017-10-08 DIAGNOSIS — E785 Hyperlipidemia, unspecified: Secondary | ICD-10-CM | POA: Diagnosis not present

## 2017-10-08 DIAGNOSIS — C719 Malignant neoplasm of brain, unspecified: Secondary | ICD-10-CM | POA: Diagnosis not present

## 2017-10-08 DIAGNOSIS — F329 Major depressive disorder, single episode, unspecified: Secondary | ICD-10-CM | POA: Diagnosis not present

## 2017-10-08 DIAGNOSIS — Z7984 Long term (current) use of oral hypoglycemic drugs: Secondary | ICD-10-CM | POA: Diagnosis not present

## 2017-10-11 ENCOUNTER — Telehealth: Payer: Self-pay | Admitting: Family Medicine

## 2017-10-11 DIAGNOSIS — R630 Anorexia: Secondary | ICD-10-CM | POA: Diagnosis not present

## 2017-10-11 DIAGNOSIS — E119 Type 2 diabetes mellitus without complications: Secondary | ICD-10-CM | POA: Diagnosis not present

## 2017-10-11 DIAGNOSIS — R634 Abnormal weight loss: Secondary | ICD-10-CM | POA: Diagnosis not present

## 2017-10-11 DIAGNOSIS — C719 Malignant neoplasm of brain, unspecified: Secondary | ICD-10-CM | POA: Diagnosis not present

## 2017-10-11 DIAGNOSIS — I10 Essential (primary) hypertension: Secondary | ICD-10-CM | POA: Diagnosis not present

## 2017-10-11 DIAGNOSIS — F329 Major depressive disorder, single episode, unspecified: Secondary | ICD-10-CM | POA: Diagnosis not present

## 2017-10-11 NOTE — Telephone Encounter (Signed)
Mary (pt's care giver) states she is having leg pain again and is needing something called in for her.  She states she is currently in Hospice care at the [facility].  Pt told care giver she spoke with Mikki Santee about this problem before.   [facility] she is using now.

## 2017-10-11 NOTE — Telephone Encounter (Signed)
If she is unable to come in to the office would wish the [place] staff or Hospice staff to evaluate her. I have treated her for gout in the past but she is on steroids already and I can't assume this is the same problem

## 2017-10-11 NOTE — Telephone Encounter (Signed)
Please review. Thanks!  

## 2017-10-12 DIAGNOSIS — F329 Major depressive disorder, single episode, unspecified: Secondary | ICD-10-CM | POA: Diagnosis not present

## 2017-10-12 DIAGNOSIS — E119 Type 2 diabetes mellitus without complications: Secondary | ICD-10-CM | POA: Diagnosis not present

## 2017-10-12 DIAGNOSIS — R634 Abnormal weight loss: Secondary | ICD-10-CM | POA: Diagnosis not present

## 2017-10-12 DIAGNOSIS — I10 Essential (primary) hypertension: Secondary | ICD-10-CM | POA: Diagnosis not present

## 2017-10-12 DIAGNOSIS — R630 Anorexia: Secondary | ICD-10-CM | POA: Diagnosis not present

## 2017-10-12 DIAGNOSIS — C719 Malignant neoplasm of brain, unspecified: Secondary | ICD-10-CM | POA: Diagnosis not present

## 2017-10-12 NOTE — Telephone Encounter (Signed)
Advised care [place]

## 2017-10-13 DIAGNOSIS — I10 Essential (primary) hypertension: Secondary | ICD-10-CM | POA: Diagnosis not present

## 2017-10-13 DIAGNOSIS — F329 Major depressive disorder, single episode, unspecified: Secondary | ICD-10-CM | POA: Diagnosis not present

## 2017-10-13 DIAGNOSIS — C719 Malignant neoplasm of brain, unspecified: Secondary | ICD-10-CM | POA: Diagnosis not present

## 2017-10-13 DIAGNOSIS — E119 Type 2 diabetes mellitus without complications: Secondary | ICD-10-CM | POA: Diagnosis not present

## 2017-10-13 DIAGNOSIS — R634 Abnormal weight loss: Secondary | ICD-10-CM | POA: Diagnosis not present

## 2017-10-13 DIAGNOSIS — R630 Anorexia: Secondary | ICD-10-CM | POA: Diagnosis not present

## 2017-10-14 ENCOUNTER — Other Ambulatory Visit: Payer: Self-pay | Admitting: *Deleted

## 2017-10-14 ENCOUNTER — Telehealth: Payer: Self-pay | Admitting: *Deleted

## 2017-10-14 DIAGNOSIS — C719 Malignant neoplasm of brain, unspecified: Secondary | ICD-10-CM | POA: Diagnosis not present

## 2017-10-14 DIAGNOSIS — R634 Abnormal weight loss: Secondary | ICD-10-CM | POA: Diagnosis not present

## 2017-10-14 DIAGNOSIS — E119 Type 2 diabetes mellitus without complications: Secondary | ICD-10-CM | POA: Diagnosis not present

## 2017-10-14 DIAGNOSIS — F329 Major depressive disorder, single episode, unspecified: Secondary | ICD-10-CM | POA: Diagnosis not present

## 2017-10-14 DIAGNOSIS — I10 Essential (primary) hypertension: Secondary | ICD-10-CM | POA: Diagnosis not present

## 2017-10-14 DIAGNOSIS — R630 Anorexia: Secondary | ICD-10-CM | POA: Diagnosis not present

## 2017-10-14 MED ORDER — DEXAMETHASONE 1 MG PO TABS: 1.0000 mg | ORAL_TABLET | Freq: Every day | ORAL | 12 refills | Status: DC

## 2017-10-14 NOTE — Telephone Encounter (Signed)
Per md order - I printed the script-pending Dr. Sharmaine Base signature. Brooke, please fax to the [facility] fax # below one Dr. B has signed.  Thanks, Albertia Carvin

## 2017-10-14 NOTE — Telephone Encounter (Signed)
[facility] now [facility] at [facility] has dex listed twice with different directions. They need an order form [place] with correct dosing of Dexamethasone. She states pharmacy tols her it should be 1 mg daily. Please fax order to [facility]: [employer] [phone]

## 2017-10-15 DIAGNOSIS — C719 Malignant neoplasm of brain, unspecified: Secondary | ICD-10-CM | POA: Diagnosis not present

## 2017-10-15 DIAGNOSIS — R634 Abnormal weight loss: Secondary | ICD-10-CM | POA: Diagnosis not present

## 2017-10-15 DIAGNOSIS — I10 Essential (primary) hypertension: Secondary | ICD-10-CM | POA: Diagnosis not present

## 2017-10-15 DIAGNOSIS — F329 Major depressive disorder, single episode, unspecified: Secondary | ICD-10-CM | POA: Diagnosis not present

## 2017-10-15 DIAGNOSIS — R630 Anorexia: Secondary | ICD-10-CM | POA: Diagnosis not present

## 2017-10-15 DIAGNOSIS — E119 Type 2 diabetes mellitus without complications: Secondary | ICD-10-CM | POA: Diagnosis not present

## 2017-10-15 NOTE — Telephone Encounter (Signed)
Rx has been faxed.

## 2017-10-18 DIAGNOSIS — E119 Type 2 diabetes mellitus without complications: Secondary | ICD-10-CM | POA: Diagnosis not present

## 2017-10-18 DIAGNOSIS — R634 Abnormal weight loss: Secondary | ICD-10-CM | POA: Diagnosis not present

## 2017-10-18 DIAGNOSIS — R630 Anorexia: Secondary | ICD-10-CM | POA: Diagnosis not present

## 2017-10-18 DIAGNOSIS — I10 Essential (primary) hypertension: Secondary | ICD-10-CM | POA: Diagnosis not present

## 2017-10-18 DIAGNOSIS — F329 Major depressive disorder, single episode, unspecified: Secondary | ICD-10-CM | POA: Diagnosis not present

## 2017-10-18 DIAGNOSIS — C719 Malignant neoplasm of brain, unspecified: Secondary | ICD-10-CM | POA: Diagnosis not present

## 2017-10-19 DIAGNOSIS — R634 Abnormal weight loss: Secondary | ICD-10-CM | POA: Diagnosis not present

## 2017-10-19 DIAGNOSIS — F329 Major depressive disorder, single episode, unspecified: Secondary | ICD-10-CM | POA: Diagnosis not present

## 2017-10-19 DIAGNOSIS — C719 Malignant neoplasm of brain, unspecified: Secondary | ICD-10-CM | POA: Diagnosis not present

## 2017-10-19 DIAGNOSIS — R630 Anorexia: Secondary | ICD-10-CM | POA: Diagnosis not present

## 2017-10-19 DIAGNOSIS — I10 Essential (primary) hypertension: Secondary | ICD-10-CM | POA: Diagnosis not present

## 2017-10-19 DIAGNOSIS — E119 Type 2 diabetes mellitus without complications: Secondary | ICD-10-CM | POA: Diagnosis not present

## 2017-10-20 DIAGNOSIS — R634 Abnormal weight loss: Secondary | ICD-10-CM | POA: Diagnosis not present

## 2017-10-20 DIAGNOSIS — I10 Essential (primary) hypertension: Secondary | ICD-10-CM | POA: Diagnosis not present

## 2017-10-20 DIAGNOSIS — F329 Major depressive disorder, single episode, unspecified: Secondary | ICD-10-CM | POA: Diagnosis not present

## 2017-10-20 DIAGNOSIS — E119 Type 2 diabetes mellitus without complications: Secondary | ICD-10-CM | POA: Diagnosis not present

## 2017-10-20 DIAGNOSIS — C719 Malignant neoplasm of brain, unspecified: Secondary | ICD-10-CM | POA: Diagnosis not present

## 2017-10-20 DIAGNOSIS — R630 Anorexia: Secondary | ICD-10-CM | POA: Diagnosis not present

## 2017-10-21 DIAGNOSIS — E119 Type 2 diabetes mellitus without complications: Secondary | ICD-10-CM | POA: Diagnosis not present

## 2017-10-21 DIAGNOSIS — C719 Malignant neoplasm of brain, unspecified: Secondary | ICD-10-CM | POA: Diagnosis not present

## 2017-10-21 DIAGNOSIS — R634 Abnormal weight loss: Secondary | ICD-10-CM | POA: Diagnosis not present

## 2017-10-21 DIAGNOSIS — F329 Major depressive disorder, single episode, unspecified: Secondary | ICD-10-CM | POA: Diagnosis not present

## 2017-10-21 DIAGNOSIS — R630 Anorexia: Secondary | ICD-10-CM | POA: Diagnosis not present

## 2017-10-21 DIAGNOSIS — I10 Essential (primary) hypertension: Secondary | ICD-10-CM | POA: Diagnosis not present

## 2017-10-22 DIAGNOSIS — R630 Anorexia: Secondary | ICD-10-CM | POA: Diagnosis not present

## 2017-10-22 DIAGNOSIS — I10 Essential (primary) hypertension: Secondary | ICD-10-CM | POA: Diagnosis not present

## 2017-10-22 DIAGNOSIS — R634 Abnormal weight loss: Secondary | ICD-10-CM | POA: Diagnosis not present

## 2017-10-22 DIAGNOSIS — F329 Major depressive disorder, single episode, unspecified: Secondary | ICD-10-CM | POA: Diagnosis not present

## 2017-10-22 DIAGNOSIS — E119 Type 2 diabetes mellitus without complications: Secondary | ICD-10-CM | POA: Diagnosis not present

## 2017-10-22 DIAGNOSIS — C719 Malignant neoplasm of brain, unspecified: Secondary | ICD-10-CM | POA: Diagnosis not present

## 2017-10-23 DIAGNOSIS — E119 Type 2 diabetes mellitus without complications: Secondary | ICD-10-CM | POA: Diagnosis not present

## 2017-10-23 DIAGNOSIS — C719 Malignant neoplasm of brain, unspecified: Secondary | ICD-10-CM | POA: Diagnosis not present

## 2017-10-23 DIAGNOSIS — F329 Major depressive disorder, single episode, unspecified: Secondary | ICD-10-CM | POA: Diagnosis not present

## 2017-10-23 DIAGNOSIS — R630 Anorexia: Secondary | ICD-10-CM | POA: Diagnosis not present

## 2017-10-23 DIAGNOSIS — R634 Abnormal weight loss: Secondary | ICD-10-CM | POA: Diagnosis not present

## 2017-10-23 DIAGNOSIS — I10 Essential (primary) hypertension: Secondary | ICD-10-CM | POA: Diagnosis not present

## 2017-10-25 DIAGNOSIS — I10 Essential (primary) hypertension: Secondary | ICD-10-CM | POA: Diagnosis not present

## 2017-10-25 DIAGNOSIS — E119 Type 2 diabetes mellitus without complications: Secondary | ICD-10-CM | POA: Diagnosis not present

## 2017-10-25 DIAGNOSIS — R630 Anorexia: Secondary | ICD-10-CM | POA: Diagnosis not present

## 2017-10-25 DIAGNOSIS — F329 Major depressive disorder, single episode, unspecified: Secondary | ICD-10-CM | POA: Diagnosis not present

## 2017-10-25 DIAGNOSIS — R634 Abnormal weight loss: Secondary | ICD-10-CM | POA: Diagnosis not present

## 2017-10-25 DIAGNOSIS — C719 Malignant neoplasm of brain, unspecified: Secondary | ICD-10-CM | POA: Diagnosis not present

## 2017-10-26 ENCOUNTER — Telehealth: Payer: Self-pay | Admitting: *Deleted

## 2017-10-26 DIAGNOSIS — R630 Anorexia: Secondary | ICD-10-CM | POA: Diagnosis not present

## 2017-10-26 DIAGNOSIS — C719 Malignant neoplasm of brain, unspecified: Secondary | ICD-10-CM | POA: Diagnosis not present

## 2017-10-26 DIAGNOSIS — I10 Essential (primary) hypertension: Secondary | ICD-10-CM | POA: Diagnosis not present

## 2017-10-26 DIAGNOSIS — F329 Major depressive disorder, single episode, unspecified: Secondary | ICD-10-CM | POA: Diagnosis not present

## 2017-10-26 DIAGNOSIS — R634 Abnormal weight loss: Secondary | ICD-10-CM | POA: Diagnosis not present

## 2017-10-26 DIAGNOSIS — E119 Type 2 diabetes mellitus without complications: Secondary | ICD-10-CM | POA: Diagnosis not present

## 2017-10-26 NOTE — Telephone Encounter (Signed)
Returned phone call to [place] at the [facility]. Had to leave a vm for [facility]

## 2017-10-26 NOTE — Telephone Encounter (Signed)
-----   Message from Wilburn Cornelia sent at 10/26/2017  3:02 PM EDT ----- Regarding: [facility] Physician Orders Kieth Brightly from the [facility] left VM about some orders? [phone]

## 2017-10-27 DIAGNOSIS — I10 Essential (primary) hypertension: Secondary | ICD-10-CM | POA: Diagnosis not present

## 2017-10-27 DIAGNOSIS — R630 Anorexia: Secondary | ICD-10-CM | POA: Diagnosis not present

## 2017-10-27 DIAGNOSIS — R634 Abnormal weight loss: Secondary | ICD-10-CM | POA: Diagnosis not present

## 2017-10-27 DIAGNOSIS — F329 Major depressive disorder, single episode, unspecified: Secondary | ICD-10-CM | POA: Diagnosis not present

## 2017-10-27 DIAGNOSIS — E119 Type 2 diabetes mellitus without complications: Secondary | ICD-10-CM | POA: Diagnosis not present

## 2017-10-27 DIAGNOSIS — C719 Malignant neoplasm of brain, unspecified: Secondary | ICD-10-CM | POA: Diagnosis not present

## 2017-10-28 DIAGNOSIS — I10 Essential (primary) hypertension: Secondary | ICD-10-CM | POA: Diagnosis not present

## 2017-10-28 DIAGNOSIS — R630 Anorexia: Secondary | ICD-10-CM | POA: Diagnosis not present

## 2017-10-28 DIAGNOSIS — C719 Malignant neoplasm of brain, unspecified: Secondary | ICD-10-CM | POA: Diagnosis not present

## 2017-10-28 DIAGNOSIS — F329 Major depressive disorder, single episode, unspecified: Secondary | ICD-10-CM | POA: Diagnosis not present

## 2017-10-28 DIAGNOSIS — E119 Type 2 diabetes mellitus without complications: Secondary | ICD-10-CM | POA: Diagnosis not present

## 2017-10-28 DIAGNOSIS — R634 Abnormal weight loss: Secondary | ICD-10-CM | POA: Diagnosis not present

## 2017-10-29 DIAGNOSIS — E119 Type 2 diabetes mellitus without complications: Secondary | ICD-10-CM | POA: Diagnosis not present

## 2017-10-29 DIAGNOSIS — R634 Abnormal weight loss: Secondary | ICD-10-CM | POA: Diagnosis not present

## 2017-10-29 DIAGNOSIS — R630 Anorexia: Secondary | ICD-10-CM | POA: Diagnosis not present

## 2017-10-29 DIAGNOSIS — I10 Essential (primary) hypertension: Secondary | ICD-10-CM | POA: Diagnosis not present

## 2017-10-29 DIAGNOSIS — F329 Major depressive disorder, single episode, unspecified: Secondary | ICD-10-CM | POA: Diagnosis not present

## 2017-10-29 DIAGNOSIS — C719 Malignant neoplasm of brain, unspecified: Secondary | ICD-10-CM | POA: Diagnosis not present

## 2017-10-30 ENCOUNTER — Inpatient Hospital Stay
Admission: EM | Admit: 2017-10-30 | Discharge: 2017-11-02 | DRG: 871 | Disposition: A | Discharge status: Skilled Nursing Facility | Attending: Internal Medicine | Admitting: Internal Medicine

## 2017-10-30 ENCOUNTER — Emergency Department

## 2017-10-30 ENCOUNTER — Encounter: Payer: Self-pay | Admitting: Emergency Medicine

## 2017-10-30 ENCOUNTER — Other Ambulatory Visit: Payer: Self-pay

## 2017-10-30 DIAGNOSIS — E785 Hyperlipidemia, unspecified: Secondary | ICD-10-CM | POA: Diagnosis present

## 2017-10-30 DIAGNOSIS — Z833 Family history of diabetes mellitus: Secondary | ICD-10-CM

## 2017-10-30 DIAGNOSIS — N179 Acute kidney failure, unspecified: Secondary | ICD-10-CM | POA: Diagnosis present

## 2017-10-30 DIAGNOSIS — J1000 Influenza due to other identified influenza virus with unspecified type of pneumonia: Secondary | ICD-10-CM | POA: Diagnosis present

## 2017-10-30 DIAGNOSIS — N390 Urinary tract infection, site not specified: Secondary | ICD-10-CM | POA: Diagnosis present

## 2017-10-30 DIAGNOSIS — Z880 Allergy status to penicillin: Secondary | ICD-10-CM | POA: Diagnosis not present

## 2017-10-30 DIAGNOSIS — J1100 Influenza due to unidentified influenza virus with unspecified type of pneumonia: Secondary | ICD-10-CM | POA: Diagnosis not present

## 2017-10-30 DIAGNOSIS — Z515 Encounter for palliative care: Secondary | ICD-10-CM | POA: Diagnosis present

## 2017-10-30 DIAGNOSIS — Z888 Allergy status to other drugs, medicaments and biological substances status: Secondary | ICD-10-CM

## 2017-10-30 DIAGNOSIS — Z66 Do not resuscitate: Secondary | ICD-10-CM | POA: Diagnosis present

## 2017-10-30 DIAGNOSIS — Z8249 Family history of ischemic heart disease and other diseases of the circulatory system: Secondary | ICD-10-CM | POA: Diagnosis not present

## 2017-10-30 DIAGNOSIS — I10 Essential (primary) hypertension: Secondary | ICD-10-CM | POA: Diagnosis not present

## 2017-10-30 DIAGNOSIS — N183 Chronic kidney disease, stage 3 (moderate): Secondary | ICD-10-CM | POA: Diagnosis present

## 2017-10-30 DIAGNOSIS — T380X5A Adverse effect of glucocorticoids and synthetic analogues, initial encounter: Secondary | ICD-10-CM | POA: Diagnosis present

## 2017-10-30 DIAGNOSIS — Z87891 Personal history of nicotine dependence: Secondary | ICD-10-CM | POA: Diagnosis not present

## 2017-10-30 DIAGNOSIS — Z85841 Personal history of malignant neoplasm of brain: Secondary | ICD-10-CM

## 2017-10-30 DIAGNOSIS — R634 Abnormal weight loss: Secondary | ICD-10-CM | POA: Diagnosis not present

## 2017-10-30 DIAGNOSIS — A4189 Other specified sepsis: Secondary | ICD-10-CM | POA: Diagnosis present

## 2017-10-30 DIAGNOSIS — C719 Malignant neoplasm of brain, unspecified: Secondary | ICD-10-CM

## 2017-10-30 DIAGNOSIS — E2740 Unspecified adrenocortical insufficiency: Secondary | ICD-10-CM | POA: Diagnosis present

## 2017-10-30 DIAGNOSIS — A419 Sepsis, unspecified organism: Secondary | ICD-10-CM

## 2017-10-30 DIAGNOSIS — F329 Major depressive disorder, single episode, unspecified: Secondary | ICD-10-CM | POA: Diagnosis not present

## 2017-10-30 DIAGNOSIS — Z91013 Allergy to seafood: Secondary | ICD-10-CM | POA: Diagnosis not present

## 2017-10-30 DIAGNOSIS — Z79890 Hormone replacement therapy (postmenopausal): Secondary | ICD-10-CM

## 2017-10-30 DIAGNOSIS — G40909 Epilepsy, unspecified, not intractable, without status epilepticus: Secondary | ICD-10-CM | POA: Diagnosis present

## 2017-10-30 DIAGNOSIS — R630 Anorexia: Secondary | ICD-10-CM | POA: Diagnosis not present

## 2017-10-30 DIAGNOSIS — J101 Influenza due to other identified influenza virus with other respiratory manifestations: Secondary | ICD-10-CM | POA: Diagnosis not present

## 2017-10-30 DIAGNOSIS — Z7984 Long term (current) use of oral hypoglycemic drugs: Secondary | ICD-10-CM

## 2017-10-30 DIAGNOSIS — E119 Type 2 diabetes mellitus without complications: Secondary | ICD-10-CM | POA: Diagnosis not present

## 2017-10-30 DIAGNOSIS — Z9221 Personal history of antineoplastic chemotherapy: Secondary | ICD-10-CM

## 2017-10-30 DIAGNOSIS — Z85850 Personal history of malignant neoplasm of thyroid: Secondary | ICD-10-CM

## 2017-10-30 DIAGNOSIS — I129 Hypertensive chronic kidney disease with stage 1 through stage 4 chronic kidney disease, or unspecified chronic kidney disease: Secondary | ICD-10-CM | POA: Diagnosis present

## 2017-10-30 DIAGNOSIS — J440 Chronic obstructive pulmonary disease with acute lower respiratory infection: Secondary | ICD-10-CM | POA: Diagnosis present

## 2017-10-30 DIAGNOSIS — E1122 Type 2 diabetes mellitus with diabetic chronic kidney disease: Secondary | ICD-10-CM | POA: Diagnosis present

## 2017-10-30 DIAGNOSIS — R4182 Altered mental status, unspecified: Secondary | ICD-10-CM | POA: Diagnosis not present

## 2017-10-30 DIAGNOSIS — Z91018 Allergy to other foods: Secondary | ICD-10-CM

## 2017-10-30 DIAGNOSIS — F05 Delirium due to known physiological condition: Secondary | ICD-10-CM | POA: Diagnosis not present

## 2017-10-30 DIAGNOSIS — J9601 Acute respiratory failure with hypoxia: Secondary | ICD-10-CM | POA: Diagnosis present

## 2017-10-30 DIAGNOSIS — Z79899 Other long term (current) drug therapy: Secondary | ICD-10-CM

## 2017-10-30 DIAGNOSIS — J189 Pneumonia, unspecified organism: Secondary | ICD-10-CM | POA: Diagnosis not present

## 2017-10-30 DIAGNOSIS — N3000 Acute cystitis without hematuria: Secondary | ICD-10-CM | POA: Diagnosis not present

## 2017-10-30 LAB — URINALYSIS, COMPLETE (UACMP) WITH MICROSCOPIC
BILIRUBIN URINE: NEGATIVE
Glucose, UA: NEGATIVE mg/dL
Hgb urine dipstick: NEGATIVE
Ketones, ur: NEGATIVE mg/dL
NITRITE: NEGATIVE
PH: 5 (ref 5.0–8.0)
PROTEIN: 100 mg/dL — AB
Specific Gravity, Urine: 1.023 (ref 1.005–1.030)

## 2017-10-30 LAB — COMPREHENSIVE METABOLIC PANEL
ALK PHOS: 102 U/L (ref 38–126)
ALT: 40 U/L (ref 14–54)
AST: 43 U/L — AB (ref 15–41)
Albumin: 3.5 g/dL (ref 3.5–5.0)
Anion gap: 13 (ref 5–15)
BUN: 25 mg/dL — AB (ref 6–20)
CALCIUM: 9.2 mg/dL (ref 8.9–10.3)
CO2: 25 mmol/L (ref 22–32)
Chloride: 105 mmol/L (ref 101–111)
Creatinine, Ser: 1.25 mg/dL — ABNORMAL HIGH (ref 0.44–1.00)
GFR, EST AFRICAN AMERICAN: 49 mL/min — AB (ref >60)
GFR, EST NON AFRICAN AMERICAN: 42 mL/min — AB (ref >60)
Glucose, Bld: 274 mg/dL — ABNORMAL HIGH (ref 65–99)
Potassium: 3.3 mmol/L — ABNORMAL LOW (ref 3.5–5.1)
Sodium: 143 mmol/L (ref 135–145)
Total Bilirubin: 0.9 mg/dL (ref 0.3–1.2)
Total Protein: 6.9 g/dL (ref 6.5–8.1)

## 2017-10-30 LAB — CBC
HCT: 36.2 % (ref 35.0–47.0)
Hemoglobin: 12 g/dL (ref 12.0–16.0)
MCH: 33 pg (ref 26.0–34.0)
MCHC: 33.2 g/dL (ref 32.0–36.0)
MCV: 99.3 fL (ref 80.0–100.0)
Platelets: 225 10*3/uL (ref 150–440)
RBC: 3.65 MIL/uL — ABNORMAL LOW (ref 3.80–5.20)
RDW: 13.8 % (ref 11.5–14.5)
WBC: 11.8 10*3/uL — ABNORMAL HIGH (ref 3.6–11.0)

## 2017-10-30 LAB — GLUCOSE, CAPILLARY
Glucose-Capillary: 258 mg/dL — ABNORMAL HIGH (ref 65–99)
Glucose-Capillary: 392 mg/dL — ABNORMAL HIGH (ref 65–99)
Glucose-Capillary: 340 mg/dL — ABNORMAL HIGH (ref 65–99)

## 2017-10-30 LAB — CBC WITH DIFFERENTIAL/PLATELET
BAND NEUTROPHILS: 32 %
BASOS ABS: 0 10*3/uL (ref 0–0.1)
BLASTS: 0 %
Basophils Relative: 0 %
EOS ABS: 0 10*3/uL (ref 0–0.7)
EOS PCT: 0 %
HEMATOCRIT: 41.6 % (ref 35.0–47.0)
Hemoglobin: 13.8 g/dL (ref 12.0–16.0)
LYMPHS ABS: 2.8 10*3/uL (ref 1.0–3.6)
Lymphocytes Relative: 18 %
MCH: 32.1 pg (ref 26.0–34.0)
MCHC: 33.2 g/dL (ref 32.0–36.0)
MCV: 96.7 fL (ref 80.0–100.0)
METAMYELOCYTES PCT: 2 %
MYELOCYTES: 2 %
Monocytes Absolute: 0.6 10*3/uL (ref 0.2–0.9)
Monocytes Relative: 4 %
NEUTROS ABS: 11.9 10*3/uL — AB (ref 1.4–6.5)
Neutrophils Relative %: 42 %
Other: 0 %
Platelets: 256 10*3/uL (ref 150–440)
Promyelocytes Absolute: 0 %
RBC: 4.3 MIL/uL (ref 3.80–5.20)
RDW: 13.1 % (ref 11.5–14.5)
WBC: 15.3 10*3/uL — AB (ref 3.6–11.0)
nRBC: 1 /100 WBC — ABNORMAL HIGH

## 2017-10-30 LAB — BLOOD GAS, VENOUS
ACID-BASE EXCESS: 0.7 mmol/L (ref 0.0–2.0)
BICARBONATE: 29.4 mmol/L — AB (ref 20.0–28.0)
PCO2 VEN: 64 mmHg — AB (ref 44.0–60.0)
PH VEN: 7.27 (ref 7.250–7.430)
Patient temperature: 37

## 2017-10-30 LAB — TROPONIN I

## 2017-10-30 LAB — LACTIC ACID, PLASMA
Lactic Acid, Venous: 2.8 mmol/L (ref 0.5–1.9)
Lactic Acid, Venous: 2.6 mmol/L (ref 0.5–1.9)

## 2017-10-30 LAB — CREATININE, SERUM
CREATININE: 1.34 mg/dL — AB (ref 0.44–1.00)
GFR calc Af Amer: 45 mL/min — ABNORMAL LOW (ref >60)
GFR calc non Af Amer: 39 mL/min — ABNORMAL LOW (ref >60)

## 2017-10-30 LAB — INFLUENZA PANEL BY PCR (TYPE A & B)
INFLBPCR: NEGATIVE
Influenza A By PCR: POSITIVE — AB

## 2017-10-30 LAB — MRSA PCR SCREENING: MRSA BY PCR: NEGATIVE

## 2017-10-30 MED ORDER — OSELTAMIVIR PHOSPHATE 30 MG PO CAPS
30.0000 mg | ORAL_CAPSULE | Freq: Two times a day (BID) | ORAL | Status: DC
  Administered 2017-10-30 – 2017-11-02 (×7): 30 mg via ORAL
  Filled 2017-10-30 (×8): qty 1

## 2017-10-30 MED ORDER — ENSURE ENLIVE PO LIQD
237.0000 mL | Freq: Three times a day (TID) | ORAL | Status: DC
  Administered 2017-10-30 – 2017-11-02 (×8): 237 mL via ORAL

## 2017-10-30 MED ORDER — POTASSIUM CHLORIDE CRYS ER 20 MEQ PO TBCR
40.0000 meq | EXTENDED_RELEASE_TABLET | ORAL | Status: AC
  Administered 2017-10-30: 40 meq via ORAL
  Filled 2017-10-30: qty 2

## 2017-10-30 MED ORDER — SODIUM CHLORIDE 0.9 % IV BOLUS (SEPSIS)
1000.0000 mL | Freq: Once | INTRAVENOUS | Status: AC
  Administered 2017-10-30: 1000 mL via INTRAVENOUS

## 2017-10-30 MED ORDER — LEVOTHYROXINE SODIUM 100 MCG PO TABS
100.0000 ug | ORAL_TABLET | Freq: Every day | ORAL | Status: DC
  Administered 2017-10-31 – 2017-11-02 (×3): 100 ug via ORAL
  Filled 2017-10-30: qty 1
  Filled 2017-10-30: qty 2
  Filled 2017-10-30: qty 1

## 2017-10-30 MED ORDER — METHYLPREDNISOLONE SODIUM SUCC 125 MG IJ SOLR
60.0000 mg | INTRAMUSCULAR | Status: DC
  Administered 2017-10-30 – 2017-11-01 (×3): 60 mg via INTRAVENOUS
  Filled 2017-10-30 (×3): qty 2

## 2017-10-30 MED ORDER — METOCLOPRAMIDE HCL 10 MG PO TABS
10.0000 mg | ORAL_TABLET | Freq: Four times a day (QID) | ORAL | Status: DC | PRN
  Administered 2017-11-01: 10 mg via ORAL
  Filled 2017-10-30: qty 1

## 2017-10-30 MED ORDER — ACETAMINOPHEN 325 MG PO TABS
650.0000 mg | ORAL_TABLET | Freq: Four times a day (QID) | ORAL | Status: DC | PRN
  Administered 2017-10-30: 650 mg via ORAL
  Filled 2017-10-30: qty 2

## 2017-10-30 MED ORDER — SODIUM CHLORIDE 0.9 % IV SOLN
INTRAVENOUS | Status: AC
  Filled 2017-10-30: qty 2

## 2017-10-30 MED ORDER — INSULIN ASPART 100 UNIT/ML ~~LOC~~ SOLN
0.0000 [IU] | Freq: Three times a day (TID) | SUBCUTANEOUS | Status: DC
  Administered 2017-10-30: 8 [IU] via SUBCUTANEOUS
  Administered 2017-10-30: 15 [IU] via SUBCUTANEOUS
  Filled 2017-10-30: qty 1

## 2017-10-30 MED ORDER — ACETAMINOPHEN 650 MG RE SUPP: 650.0000 mg | Freq: Four times a day (QID) | RECTAL | Status: DC | PRN

## 2017-10-30 MED ORDER — HYDROCODONE-ACETAMINOPHEN 5-325 MG PO TABS
1.0000 | ORAL_TABLET | ORAL | Status: DC | PRN
  Administered 2017-11-02: 1 via ORAL
  Filled 2017-10-30: qty 1

## 2017-10-30 MED ORDER — DOCUSATE SODIUM 100 MG PO CAPS
100.0000 mg | ORAL_CAPSULE | Freq: Two times a day (BID) | ORAL | Status: DC
  Administered 2017-10-30 – 2017-11-02 (×7): 100 mg via ORAL
  Filled 2017-10-30 (×7): qty 1

## 2017-10-30 MED ORDER — SODIUM CHLORIDE 0.9 % IV SOLN
2.0000 g | Freq: Once | INTRAVENOUS | Status: AC
  Administered 2017-10-30: 2 g via INTRAVENOUS
  Filled 2017-10-30: qty 2

## 2017-10-30 MED ORDER — VANCOMYCIN HCL IN DEXTROSE 1-5 GM/200ML-% IV SOLN
1000.0000 mg | Freq: Once | INTRAVENOUS | Status: AC
  Administered 2017-10-30: 1000 mg via INTRAVENOUS
  Filled 2017-10-30: qty 200

## 2017-10-30 MED ORDER — PROMETHAZINE HCL 25 MG PO TABS
25.0000 mg | ORAL_TABLET | Freq: Four times a day (QID) | ORAL | Status: DC | PRN
  Filled 2017-10-30: qty 1

## 2017-10-30 MED ORDER — LISINOPRIL 20 MG PO TABS
20.0000 mg | ORAL_TABLET | Freq: Every day | ORAL | Status: DC
  Filled 2017-10-30: qty 2

## 2017-10-30 MED ORDER — ENOXAPARIN SODIUM 40 MG/0.4ML ~~LOC~~ SOLN
40.0000 mg | SUBCUTANEOUS | Status: DC
  Administered 2017-10-30 – 2017-11-02 (×4): 40 mg via SUBCUTANEOUS
  Filled 2017-10-30 (×4): qty 0.4

## 2017-10-30 MED ORDER — LEVETIRACETAM 500 MG PO TABS
500.0000 mg | ORAL_TABLET | Freq: Two times a day (BID) | ORAL | Status: DC
  Administered 2017-10-30 – 2017-11-02 (×7): 500 mg via ORAL
  Filled 2017-10-30 (×8): qty 1

## 2017-10-30 MED ORDER — IPRATROPIUM-ALBUTEROL 0.5-2.5 (3) MG/3ML IN SOLN
3.0000 mL | Freq: Four times a day (QID) | RESPIRATORY_TRACT | Status: DC
  Administered 2017-10-30 – 2017-11-02 (×13): 3 mL via RESPIRATORY_TRACT
  Filled 2017-10-30 (×13): qty 3

## 2017-10-30 MED ORDER — INSULIN ASPART 100 UNIT/ML ~~LOC~~ SOLN
0.0000 [IU] | SUBCUTANEOUS | Status: DC
  Administered 2017-10-31: 7 [IU] via SUBCUTANEOUS
  Administered 2017-10-31: 20 [IU] via SUBCUTANEOUS
  Filled 2017-10-30 (×2): qty 1

## 2017-10-30 MED ORDER — AMLODIPINE BESYLATE 5 MG PO TABS
5.0000 mg | ORAL_TABLET | Freq: Every day | ORAL | Status: DC
  Administered 2017-10-30 – 2017-11-02 (×3): 5 mg via ORAL
  Filled 2017-10-30 (×3): qty 1

## 2017-10-30 MED ORDER — SODIUM CHLORIDE 0.9 % IV SOLN
INTRAVENOUS | Status: DC
  Administered 2017-10-30 – 2017-10-31 (×2): via INTRAVENOUS

## 2017-10-30 MED ORDER — VANCOMYCIN HCL IN DEXTROSE 750-5 MG/150ML-% IV SOLN
750.0000 mg | INTRAVENOUS | Status: DC
  Administered 2017-10-30: 750 mg via INTRAVENOUS
  Filled 2017-10-30 (×2): qty 150

## 2017-10-30 MED ORDER — IPRATROPIUM-ALBUTEROL 0.5-2.5 (3) MG/3ML IN SOLN
3.0000 mL | Freq: Once | RESPIRATORY_TRACT | Status: AC
  Administered 2017-10-30: 3 mL via RESPIRATORY_TRACT
  Filled 2017-10-30: qty 3

## 2017-10-30 MED ORDER — AZTREONAM 2 G IJ SOLR
2.0000 g | Freq: Three times a day (TID) | INTRAMUSCULAR | Status: DC
Start: 2017-10-30 — End: 2017-11-01
  Administered 2017-10-30 – 2017-11-01 (×6): 2 g via INTRAVENOUS
  Filled 2017-10-30 (×10): qty 2

## 2017-10-30 MED ORDER — DEXAMETHASONE 0.5 MG PO TABS: 1.0000 mg | ORAL_TABLET | Freq: Every day | ORAL | Status: DC

## 2017-10-30 MED ORDER — METFORMIN HCL 500 MG PO TABS: 500.0000 mg | ORAL_TABLET | Freq: Two times a day (BID) | ORAL | Status: DC

## 2017-10-30 MED ORDER — OSELTAMIVIR PHOSPHATE 30 MG PO CAPS
30.0000 mg | ORAL_CAPSULE | Freq: Once | ORAL | Status: AC
  Administered 2017-10-30: 30 mg via ORAL
  Filled 2017-10-30: qty 1

## 2017-10-30 NOTE — ED Notes (Signed)
RT called to help move patient to ICU

## 2017-10-30 NOTE — ED Notes (Signed)
Called lab to come draw 2nd lactic due to pt being difficult stick and IV not pulling back. Per [facility] in the lab, they are backed up will draw lactic when pt gets to ICU

## 2017-10-30 NOTE — Progress Notes (Signed)
Pharmacy Antibiotic Note  Martha [name] is a 72 y.o. female admitted on 10/30/2017 with HCAP.  Pharmacy has been consulted for aztreonam and vancomycin dosing.  Plan: 1. Aztreonam 2 gm IV Q8H 2. Vancomycin 1000 mg IV x 1 in ED followed in approximately 18 hours (stacked dosing) by vancomycin 750 mg IV Q24H, predicted trough 17 mcg/mL. Pharmacy will continue to follow and adjust as needed to maintain trough 15 to 20 mcg/ml.   Vd 39 L, Ke 0.032 hr-1, T1/2 21/3 hr  Height: 5' (152.4 cm) Weight: 158 lb (71.7 kg) IBW/kg (Calculated) : 45.5  Temp (24hrs), Avg:97.9 F (36.6 C), Min:97.9 F (36.6 C), Max:97.9 F (36.6 C)  Recent Labs  Lab 10/30/17 0824 10/30/17 1234  WBC 15.3* 11.8*  CREATININE 1.25* 1.34*  LATICACIDVEN 2.8*  --     Estimated Creatinine Clearance: 34 mL/min (A) (by C-G formula based on SCr of 1.34 mg/dL (H)).    Allergies  Allergen Reactions  . Ampicillin Other (See Comments)    Yeast rash   . Fish-Derived Products Other (See Comments)    gout  . Pravastatin     Associated with muscle aches and headaches  . Tomato Other (See Comments)    Upset stomach   . Penicillins Rash    .Has patient had a PCN reaction causing immediate rash, facial/tongue/throat swelling, SOB or lightheadedness with hypotension: Unknown Has patient had a PCN reaction causing severe rash involving mucus membranes or skin necrosis: Unknown Has patient had a PCN reaction that required hospitalization: Unknown Has patient had a PCN reaction occurring within the last 10 years: Unknown If all of the above answers are "NO", then may proceed with Cephalosporin use.     Antimicrobials this admission:   Dose adjustments this admission:   Microbiology results:  BCx:   UCx:    Sputum:    MRSA PCR:   Thank you for allowing pharmacy to be a part of this patient's care.  Laural Benes, Pharm.D., BCPS Clinical Pharmacist 10/30/2017 2:15 PM

## 2017-10-30 NOTE — ED Notes (Signed)
Resp notified of the need for Bi Pap at this time, per verbal order from EDP.

## 2017-10-30 NOTE — ED Notes (Signed)
Attempted to call report, RN unavailable, will call me back

## 2017-10-30 NOTE — H&P (Signed)
[facility] NAME: Martha [name]    MR#:  [id number]  DATE OF BIRTH:  [birth year]  DATE OF ADMISSION:  10/30/2017  PRIMARY CARE PHYSICIAN: Carmon Ginsberg, PA   REQUESTING/REFERRING PHYSICIAN: Dr. Merlyn Lot  CHIEF COMPLAINT: Shortness of breath   Chief Complaint  Patient presents with  . Altered Mental Status    HISTORY OF PRESENT ILLNESS:  Martha [name]  is a 72 y.o. female with a known history of angioma status post surgery, radiation comes from [facility] for altered mental status, generalized weakness.  Patient staff noticed that patient not able to get out of bed this morning associated with shortness of breath.  Patient is on BiPAP now.  She said that he has been having shortness of breath for about 3 weeks and today got worse associated with cough.  Patient noted to have type A influenza, pneumonia.  Patient right now on BiPAP so we are admitting her to stepdown  PAST MEDICAL HISTORY:   Past Medical History:  Diagnosis Date  . Cancer [facility])    thyroid cancer treated with radioactive iodine  . Depression   . Diabetes mellitus without complication ([place])   . Epilepsy ([place])   . 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 Hyperlipidemia   . Hypertension   . Thyroid disease    h/o thyroid ca- 30 years ago  . Varicose vein     PAST SURGICAL HISTOIRY:   Past Surgical History:  Procedure Laterality Date  . APPLICATION OF CRANIAL NAVIGATION N/A 08/02/2015   Procedure: APPLICATION OF CRANIAL NAVIGATION;  Surgeon: Consuella Lose, MD;  Location: [place] [facility];  Service: Neurosurgery;  Laterality: N/A;  APPLICATION OF CRANIAL NAVIGATION  . BILATERAL CARPAL TUNNEL RELEASE  1990,1991   left hand in 1990 right hand in '91  . CERVICAL SPINE SURGERY  05/1997   spinal fusion  . CHOLECYSTECTOMY  04/24/2008   status post laparoscopic Dr.Ely  . CRANIOTOMY Right 08/02/2015   Procedure: CRANIOTOMY  TUMOR EXCISION;  Surgeon: Consuella Lose, MD;  Location: [place] [facility];  Service: Neurosurgery;  Laterality: Right;  CRANIOTOMY TUMOR EXCISION  . radioactive iodine    . THYROIDECTOMY, PARTIAL  1964   due to cancerous tumor  . TOE SURGERY Left 1989    SOCIAL HISTORY:   Social History   Tobacco Use  . Smoking status: Former Research scientist (life sciences)  . Smokeless tobacco: Never Used  . Tobacco comment: quit smoking in 2007  Substance Use Topics  . Alcohol use: No    Alcohol/week: 0.0 oz    FAMILY HISTORY:   Family History  Problem Relation Age of Onset  . Arthritis Mother   . Hyperlipidemia Mother   . Hypertension Mother   . Heart disease Mother   . Diabetes Mother        type 2  . Thyroid disease Mother   . Cancer Father        colon  . Alcohol abuse Brother   . Hyperlipidemia Brother   . Hypertension Brother   . Breast cancer Neg Hx   . Ovarian cancer Neg Hx     DRUG ALLERGIES:   Allergies  Allergen Reactions  . Ampicillin Other (See Comments)    Yeast rash   . Fish-Derived Products Other (See Comments)    gout  . Pravastatin     Associated with muscle aches and headaches  . Tomato Other (See Comments)    Upset  stomach   . Penicillins Rash    .Has patient had a PCN reaction causing immediate rash, facial/tongue/throat swelling, SOB or lightheadedness with hypotension: Unknown Has patient had a PCN reaction causing severe rash involving mucus membranes or skin necrosis: Unknown Has patient had a PCN reaction that required hospitalization: Unknown Has patient had a PCN reaction occurring within the last 10 years: Unknown If all of the above answers are "NO", then may proceed with Cephalosporin use.     REVIEW OF SYSTEMS:  CONSTITUTIONAL: Fatigue, EYES: No blurred or double vision.  EARS, NOSE, AND THROAT: No tinnitus or ear pain.  RESPIRATORY cough, shortness of breath cARDIOVASCULAR: No chest pain, orthopnea, edema.  GASTROINTESTINAL: No nausea, vomiting, diarrhea or  abdominal pain.  GENITOURINARY: No dysuria, hematuria.  ENDOCRINE: No polyuria, nocturia,  HEMATOLOGY: No anemia, easy bruising or bleeding SKIN: No rash or lesion. MUSCULOSKELETAL: No joint pain or arthritis.   NEUROLOGIC: No tingling, numbness, weakness.  PSYCHIATRY: No anxiety or depression.   MEDICATIONS AT HOME:   Prior to Admission medications   Medication Sig Start Date End Date Taking? Authorizing Provider  acetaminophen (TYLENOL) 500 MG tablet Take 500-1,000 mg by mouth daily as needed.   Yes [provider]  amLODipine (NORVASC) 5 MG tablet Take 1 tablet (5 mg total) by mouth daily. 04/09/17  Yes Carmon Ginsberg, PA  aspirin-acetaminophen-caffeine (EXCEDRIN MIGRAINE) [phone] MG tablet Take 2 tablets by mouth as directed.   Yes [provider]  dexamethasone (DECADRON) 1 MG tablet Take 1 tablet (1 mg total) by mouth daily with breakfast. Patient taking differently: Take 3 mg by mouth daily with breakfast.  10/14/17  Yes Cammie Sickle, MD  feeding supplement, ENSURE ENLIVE, (ENSURE ENLIVE) LIQD Take 237 mLs by mouth 3 (three) times daily between meals. 03/04/17  Yes Wieting, Richard, MD  glipiZIDE (GLUCOTROL) 5 MG tablet Take 5 mg by mouth 2 (two) times daily.   Yes [provider]  levETIRAcetam (KEPPRA) 500 MG tablet Take 1 tablet (500 mg total) by mouth 2 (two) times daily. Reported on 12/11/2015 04/09/17  Yes Carmon Ginsberg, PA  levothyroxine (SYNTHROID, LEVOTHROID) 100 MCG tablet Take 1 tablet (100 mcg total) by mouth daily. 04/09/17  Yes Carmon Ginsberg, PA  lisinopril (PRINIVIL,ZESTRIL) 20 MG tablet Take 1 tablet by mouth daily. 05/16/17  Yes [provider]  metoCLOPramide (REGLAN) 10 MG tablet Take 10 mg by mouth every 6 (six) hours as needed for nausea.   Yes [provider]  promethazine (PHENERGAN) 25 MG tablet Take 25 mg by mouth every 6 (six) hours as needed for nausea or vomiting.   Yes [provider]  metFORMIN  (GLUCOPHAGE) 500 MG tablet Take 1 tablet (500 mg total) by mouth 2 (two) times daily with a meal. Patient not taking: Reported on 10/30/2017 09/08/17   Cammie Sickle, MD      VITAL SIGNS:  Blood pressure 108/80, pulse (!) 126, temperature 97.9 F (36.6 C), resp. rate (!) 38, height 5' (1.524 m), weight 71.7 kg (158 lb), SpO2 98 %.  PHYSICAL EXAMINATION:  GENERAL:  72 y.o.-year-old patient lying in the bed wit mild acute distress because of BiPAP .  EYES: Pupils equal, round, reactive to light. No scleral icterus. Extraocular muscles intact.  HEENT: Head atraumatic, normocephalic. Oropharynx and nasopharynx clear.  NECK:  Supple, no jugular venous distention. No thyroid enlargement, no tenderness.  LUNGS:  has faint expiratory wheeze bilaterally CARDIOVASCULAR: S1, S2 slightly tachycardic. No murmurs, rubs, or gallops.  ABDOMEN:  Soft, distended with poor bowel sounds, no organomegaly  eXTREMITIES: No pedal edema, cyanosis, or clubbing.  NEUROLOGIC: Cranial nerves II through XII are intact. Muscle strength 5/5 in all extremities. Sensation intact. Gait not checked.  PSYCHIATRIC: The patient is alert and oriented x 3.  SKIN: No obvious rash, lesion, or ulcer.   LABORATORY PANEL:   CBC Recent Labs  Lab 10/30/17 0824  WBC 15.3*  HGB 13.8  HCT 41.6  PLT 256   ------------------------------------------------------------------------------------------------------------------  Chemistries  Recent Labs  Lab 10/30/17 0824  NA 143  K 3.3*  CL 105  CO2 25  GLUCOSE 274*  BUN 25*  CREATININE 1.25*  CALCIUM 9.2  AST 43*  ALT 40  ALKPHOS 102  BILITOT 0.9   ------------------------------------------------------------------------------------------------------------------  Cardiac Enzymes Recent Labs  Lab 10/30/17 0824  TROPONINI <0.03   ------------------------------------------------------------------------------------------------------------------  RADIOLOGY:  Ct  Head Wo Contrast  Result Date: 10/30/2017 CLINICAL DATA:  Altered mental status, weakness; history of glioblastoma post resection EXAM: CT HEAD WITHOUT CONTRAST TECHNIQUE: Contiguous axial images were obtained from the base of the skull through the vertex without intravenous contrast. Sagittal and coronal MPR images reconstructed from axial data set. COMPARISON:  12/07/2016 FINDINGS: Brain: Generalized atrophy. Stable ventricular morphology with mild ex vacuo dilatation of the temporal horn of the RIGHT lateral ventricle. No midline shift or mass effect. Dystrophic calcifications are again seen at multiple sites in the LEFT hemisphere. Small vessel chronic ischemic changes of deep cerebral white matter. Encephalomalacia at RIGHT temporal lobe likely related to prior surgery. No intracranial hemorrhage, new mass lesion, or evidence of acute infarction. Calcification is also seen anterior to the spinal cord at the C1 level. Vascular: Atherosclerotic calcifications of the internal carotid arteries bilaterally at skull base. Skull: Prior RIGHT temporal craniotomy. Bones demineralized. No acute bone lesions. Sinuses/Orbits: Scattered mucosal thickening throughout ethmoid air cells, maxillary sinuses and sphenoid sinus. Opacified LEFT middle ear cavity and a few LEFT mastoid air cells Other: N/A IMPRESSION: Atrophy with small vessel chronic ischemic changes of deep cerebral white matter. Prior RIGHT temporal craniotomy and temporal lobe resection. No acute intracranial abnormalities. New opacification of the LEFT middle ear cavity and a few LEFT mastoid air cells. Electronically Signed   By: Lavonia Dana M.D.   On: 10/30/2017 09:22   Dg Chest Port 1 View  Result Date: 10/30/2017 CLINICAL DATA:  Respiratory distress. EXAM: PORTABLE CHEST 1 VIEW COMPARISON:  Chest x-ray dated March 08, 2017. FINDINGS: The heart size and mediastinal contours are within normal limits. Normal pulmonary vascularity. New patchy opacity at  the left lung base. The right lung is clear. No pleural effusion or pneumothorax. No acute osseous abnormality. IMPRESSION: 1. Left lower lobe atelectasis versus developing pneumonia. Electronically Signed   By: Titus Dubin M.D.   On: 10/30/2017 08:52    EKG:   Orders placed or performed during the hospital encounter of 10/30/17  . ED EKG 12-Lead  . ED EKG 12-Lead  . EKG 12-Lead  . EKG 12-Lead   EKG shows sinus tachycardia with 130 bpm, no ST-T changes. 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 Aletha Halim note, comes from the [facility] because of altered mental status, weakness found to have pneumonia, influenza.  #1. acute respiratory failure with hypoxia secondary to pneumonia, type influenza: Patient admitted to intensive care unit because  patient is on BiPAP, she is on 10/5 and 30% FiO2 with respiratory rate of 12, continue BiPAP support, admitted to stepdown unit, continue vancomycin, cefepime, Tamiflu, appreciate and intensivist following the patient.  2.  COPD exacerbation: Patient is on bronchodilators, steroids, oxygen.  3.  Sepsis present on admission with evidence of tachycardia, leukocytosis, mild acute renal failure, slightly elevated lactic acid up to 2.8.: Follow blood cultures, continue aggressive antibiotics, Also continue IV fluids.  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  Continue sliding scale with coverage at this time.  #5 history of adrenalinsufficiency: Patient on low-dose steroid at home, continue IV steroids.  6.  History of thyroid cancer, patient on  Synthyroid.  7.  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  Given overall declining performance, patient preference, poor tolerance to chemotherapy patient is currently on hospice.  CODE STATUS DNR/DNI.  Discussed with patient. All the records are reviewed and case discussed with ED provider. Management plans discussed with the patient, family and they are in agreement.  CODE STATUS: DNR  TOTAL TIME TAKING CARE OF THIS PATIENT: 55 minutes.    Epifanio Lesches M.D on 10/30/2017 at 11:48 AM  Between 7am to 6pm - Pager - [phone]  After 6pm go to [email or web address] - password [email or web address] [facility]  Office  [phone]  CC: Primary care physician; Carmon Ginsberg, PA  Note: This dictation was prepared with Dragon dictation along with smaller phrase technology. Any transcriptional errors that result from this process are unintentional.

## 2017-10-30 NOTE — ED Triage Notes (Signed)
Pt to ED via [facility] from [facility] for Altered mental status and weakness. Staff reports that pt is normally able to ambulate but was unable to get out of bed this morning. Pt is a hospice pt, pt has hx/o brain tumor. Pt A & O x 2 at this time. Pt has audible wheezing and is c/o shortness of breath

## 2017-10-30 NOTE — Progress Notes (Signed)
ED RN Levada Dy informed height needs to be entered in Epic for vancomycin and Zosyn dosing. She said she will enter height.

## 2017-10-30 NOTE — ED Provider Notes (Signed)
[facility] Emergency Department Provider Note    First MD Initiated Contact with Patient 10/30/17 [phone]     (approximate)  I have reviewed the triage vital signs and the nursing notes.   HISTORY  Chief Complaint Altered Mental Status  Level v Caveat:  Acute encephalopathy, unreliable historian  HPI BILLIE TRAGER is a 72 y.o. female with a history of thyroid cancer as well as glioblastoma status post biopsy and currently in hospice care at the [facility] presents with altered mental status since this morning.  Patient normally able to walk and communicate but was noncommunicative this morning.  No report of falls.  Patient does appear short of breath mild tachypnea but normal O2 saturations.  Unable to provide any additional history.  No further history available by EMS.  Will try to contact facility staff and family.  Past Medical History:  Diagnosis Date  . Cancer [facility])    thyroid cancer treated with radioactive iodine  . Depression   . Diabetes mellitus without complication ([place])   . Epilepsy ([place])   . Glioblastoma determined by biopsy of brain ([place])   . Hyperlipidemia   . Hypertension   . Thyroid disease    h/o thyroid ca- 30 years ago  . Varicose vein    Family History  Problem Relation Age of Onset  . Arthritis Mother   . Hyperlipidemia Mother   . Hypertension Mother   . Heart disease Mother   . Diabetes Mother        type 2  . Thyroid disease Mother   . Cancer Father        colon  . Alcohol abuse Brother   . Hyperlipidemia Brother   . Hypertension Brother   . Breast cancer Neg Hx   . Ovarian cancer Neg Hx    Past Surgical History:  Procedure Laterality Date  . APPLICATION OF CRANIAL NAVIGATION N/A 08/02/2015   Procedure: APPLICATION OF CRANIAL NAVIGATION;  Surgeon: Consuella Lose, MD;  Location: [place] NEURO ORS;  Service: Neurosurgery;  Laterality: N/A;  APPLICATION OF CRANIAL NAVIGATION  . BILATERAL CARPAL TUNNEL RELEASE   1990,1991   left hand in 1990 right hand in '91  . CERVICAL SPINE SURGERY  05/1997   spinal fusion  . CHOLECYSTECTOMY  04/24/2008   status post laparoscopic Dr.Ely  . CRANIOTOMY Right 08/02/2015   Procedure: CRANIOTOMY TUMOR EXCISION;  Surgeon: Consuella Lose, MD;  Location: [place] NEURO ORS;  Service: Neurosurgery;  Laterality: Right;  CRANIOTOMY TUMOR EXCISION  . radioactive iodine    . THYROIDECTOMY, PARTIAL  1964   due to cancerous tumor  . TOE SURGERY Left 1989   Patient Active Problem List   Diagnosis Date Noted  . Pulmonary nodule, right 03/08/2017  . Colon polyp 03/08/2017  . Poor fluid intake   . Right hip pain 10/14/2016  . Thyroid cancer ([place]) 10/14/2016  . CKD (chronic kidney disease) stage 3, GFR 30-59 ml/min (HCC) 07/06/2016  . Gout 04/06/2016  . Glioblastoma determined by biopsy of brain ([place]) 08/27/2015  . S/P craniotomy 08/02/2015  . HLD (hyperlipidemia) 12/24/2014  . Adult hypothyroidism 12/24/2014  . Headache, migraine 12/24/2014  . Essential hypertension 05/29/2014  . Type 2 diabetes mellitus ([place]) 05/29/2014  . Seizure disorder ([place]) 05/29/2014  . Adiposity 05/29/2014      Prior to Admission medications   Medication Sig Start Date End Date Taking? Authorizing Provider  acetaminophen (TYLENOL) 500 MG tablet Take 500-1,000 mg by mouth daily as needed.  Yes [provider]  amLODipine (NORVASC) 5 MG tablet Take 1 tablet (5 mg total) by mouth daily. 04/09/17  Yes Carmon Ginsberg, PA  aspirin-acetaminophen-caffeine (EXCEDRIN MIGRAINE) [phone] MG tablet Take 2 tablets by mouth as directed.   Yes [provider]  dexamethasone (DECADRON) 1 MG tablet Take 1 tablet (1 mg total) by mouth daily with breakfast. Patient taking differently: Take 3 mg by mouth daily with breakfast.  10/14/17  Yes Cammie Sickle, MD  feeding supplement, ENSURE ENLIVE, (ENSURE ENLIVE) LIQD Take 237 mLs by mouth 3 (three) times daily between meals. 03/04/17  Yes  Wieting, Richard, MD  glipiZIDE (GLUCOTROL) 5 MG tablet Take 5 mg by mouth 2 (two) times daily.   Yes [provider]  levETIRAcetam (KEPPRA) 500 MG tablet Take 1 tablet (500 mg total) by mouth 2 (two) times daily. Reported on 12/11/2015 04/09/17  Yes Carmon Ginsberg, PA  levothyroxine (SYNTHROID, LEVOTHROID) 100 MCG tablet Take 1 tablet (100 mcg total) by mouth daily. 04/09/17  Yes Carmon Ginsberg, PA  lisinopril (PRINIVIL,ZESTRIL) 20 MG tablet Take 1 tablet by mouth daily. 05/16/17  Yes [provider]  metoCLOPramide (REGLAN) 10 MG tablet Take 10 mg by mouth every 6 (six) hours as needed for nausea.   Yes [provider]  promethazine (PHENERGAN) 25 MG tablet Take 25 mg by mouth every 6 (six) hours as needed for nausea or vomiting.   Yes [provider]  metFORMIN (GLUCOPHAGE) 500 MG tablet Take 1 tablet (500 mg total) by mouth 2 (two) times daily with a meal. Patient not taking: Reported on 10/30/2017 09/08/17   Cammie Sickle, MD    Allergies Ampicillin; Fish-derived products; Pravastatin; Tomato; and Penicillins    Social History Social History   Tobacco Use  . Smoking status: Former Research scientist (life sciences)  . Smokeless tobacco: Never Used  . Tobacco comment: quit smoking in 2007  Substance Use Topics  . Alcohol use: No    Alcohol/week: 0.0 oz  . Drug use: No    Review of Systems Patient denies headaches, rhinorrhea, blurry vision, numbness, shortness of breath, chest pain, edema, cough, abdominal pain, nausea, vomiting, diarrhea, dysuria, fevers, rashes or hallucinations unless otherwise stated above in HPI. ____________________________________________   PHYSICAL EXAM:  VITAL SIGNS: Vitals:   10/30/17 0815 10/30/17 0903  BP: 126/75   Pulse: (!) 130 (!) 128  Resp:  (!) 38  Temp:    SpO2: (!) 80% 100%    Constitutional: Alert but disoriented and unable to contribute history, protecting airway but appears in mild resp distress 2/2 tachypnea Eyes:  Conjunctivae are normal.  Head: Atraumatic. Nose: No congestion/rhinnorhea. Mouth/Throat: Mucous membranes are moist.   Neck: No stridor. Painless ROM.  Cardiovascular: tachycardic rate, regular rhythm. Grossly normal heart sounds.  Good peripheral circulation. Respiratory: tachypnea with diffuse expiratory wheeze Gastrointestinal: Soft and nontender. No distention. No abdominal bruits. No CVA tenderness. Genitourinary:  Musculoskeletal: No lower extremity tenderness nor edema.  No joint effusions. Neurologic:  MAE spontaneously, no facial droop noted, Skin:  Skin is warm, dry and intact. No rash noted. ____________________________________________   LABS (all labs ordered are listed, but only abnormal results are displayed)  Results for orders placed or performed during the hospital encounter of 10/30/17 (from the past 24 hour(s))  Lactic acid, plasma     Status: Abnormal   Collection Time: 10/30/17  8:24 AM  Result Value Ref Range   Lactic Acid, Venous 2.8 (HH) 0.5 - 1.9 mmol/L  Comprehensive metabolic panel  Status: Abnormal   Collection Time: 10/30/17  8:24 AM  Result Value Ref Range   Sodium 143 135 - 145 mmol/L   Potassium 3.3 (L) 3.5 - 5.1 mmol/L   Chloride 105 101 - 111 mmol/L   CO2 25 22 - 32 mmol/L   Glucose, Bld 274 (H) 65 - 99 mg/dL   BUN 25 (H) 6 - 20 mg/dL   Creatinine, Ser 1.25 (H) 0.44 - 1.00 mg/dL   Calcium 9.2 8.9 - 10.3 mg/dL   Total Protein 6.9 6.5 - 8.1 g/dL   Albumin 3.5 3.5 - 5.0 g/dL   AST 43 (H) 15 - 41 U/L   ALT 40 14 - 54 U/L   Alkaline Phosphatase 102 38 - 126 U/L   Total Bilirubin 0.9 0.3 - 1.2 mg/dL   GFR calc non Af Amer 42 (L) >60 mL/min   GFR calc Af Amer 49 (L) >60 mL/min   Anion gap 13 5 - 15  Troponin I     Status: None   Collection Time: 10/30/17  8:24 AM  Result Value Ref Range   Troponin I <0.03 <0.03 ng/mL  CBC WITH DIFFERENTIAL     Status: Abnormal (Preliminary result)   Collection Time: 10/30/17  8:24 AM  Result Value Ref  Range   WBC 15.3 (H) 3.6 - 11.0 K/uL   RBC 4.30 3.80 - 5.20 MIL/uL   Hemoglobin 13.8 12.0 - 16.0 g/dL   HCT 41.6 35.0 - 47.0 %   MCV 96.7 80.0 - 100.0 fL   MCH 32.1 26.0 - 34.0 pg   MCHC 33.2 32.0 - 36.0 g/dL   RDW 13.1 11.5 - 14.5 %   Platelets 256 150 - 440 K/uL   Neutrophils Relative % PENDING %   Neutro Abs PENDING 1.7 - 7.7 K/uL   Band Neutrophils PENDING %   Lymphocytes Relative PENDING %   Lymphs Abs PENDING 0.7 - 4.0 K/uL   Monocytes Relative PENDING %   Monocytes Absolute PENDING 0.1 - 1.0 K/uL   Eosinophils Relative PENDING %   Eosinophils Absolute PENDING 0.0 - 0.7 K/uL   Basophils Relative PENDING %   Basophils Absolute PENDING 0.0 - 0.1 K/uL   WBC Morphology PENDING    RBC Morphology PENDING    Smear Review PENDING    Other PENDING %   nRBC PENDING 0 /100 WBC   Metamyelocytes Relative PENDING %   Myelocytes PENDING %   Promyelocytes Absolute PENDING %   Blasts PENDING %  Blood gas, venous ([facility], [facility], [facility])     Status: Abnormal   Collection Time: 10/30/17  8:24 AM  Result Value Ref Range   pH, Ven 7.27 7.250 - 7.430   pCO2, Ven 64 (H) 44.0 - 60.0 mmHg   Bicarbonate 29.4 (H) 20.0 - 28.0 mmol/L   Acid-Base Excess 0.7 0.0 - 2.0 mmol/L   Patient temperature 37.0    Collection site VEIN    Sample type VENIPUNCTURE   Influenza panel by PCR (type A & B)     Status: Abnormal   Collection Time: 10/30/17  8:36 AM  Result Value Ref Range   Influenza A By PCR POSITIVE (A) NEGATIVE   Influenza B By PCR NEGATIVE NEGATIVE   ____________________________________________  EKG My review and personal interpretation at Time: 8:13   Indication: sob  Rate: 130  Rhythm: sinus Axis: normal Other: normal intervals, nonspecific st changes, no stemi ____________________________________________  RADIOLOGY  I personally reviewed all radiographic images ordered to evaluate for  the above acute complaints and reviewed radiology reports and findings.  These findings were personally  discussed with the patient.  Please see medical record for radiology report.  ____________________________________________   PROCEDURES  Procedure(s) performed:  .Critical Care Performed by: Merlyn Lot, MD Authorized by: Merlyn Lot, MD   Critical care provider statement:    Critical care time (minutes):  40   Critical care time was exclusive of:  Separately billable procedures and treating other patients   Critical care was necessary to treat or prevent imminent or life-threatening deterioration of the following conditions:  Respiratory failure   Critical care was time spent personally by me on the following activities:  Development of treatment plan with patient or surrogate, discussions with consultants, evaluation of patient's response to treatment, examination of patient, obtaining history from patient or surrogate, ordering and performing treatments and interventions, ordering and review of laboratory studies, ordering and review of radiographic studies, pulse oximetry, re-evaluation of patient's condition and review of old charts      Critical Care performed: yes ____________________________________________   INITIAL IMPRESSION / [place] / ED COURSE  Pertinent labs & imaging results that were available during my care of the patient were reviewed by me and considered in my medical decision making (see chart for details).  DDX: Dehydration, sepsis, pna, uti, hypoglycemia, cva, drug effect, withdrawal, encephalitis   MOYINOLUWA DAWE is a 72 y.o. who presents to the ED with acute encephalopathy and appears critically ill.  Patient with very complex past medical history given very poor prognosis with history of GBM and hospice care.  Seems very short of breath though I do not see any history of COPD.  Will trial DuoNeb.  Will get blood work to evaluate for sepsis.  Will order CT head due to concern for edema or bleed.  Patient was given nebulizer  treatment with some improvement in respirations but still hypoxic to 80% on room air requiring supplemental oxygen.  Due to her tachypnea will place on BiPAP.  Clinical Course as of Oct 31 1406  Sat Oct 30, 2017  0909 Patient reassessed.  Feels much improved on BiPAP.  States that she currently would be interested in being intubated and attempts made for resuscitation despite her poor prognosis.  Medical power of attorney is at bedside.   [PR]  [phone] Discussed results with patient and medical POA at bedside.  Based on her respiratory distress patient will require admission to the hospital.  Lactate noted.  Seems most clinically consistent from flu.  Did receive antibiotics due to severity of her condition.  Patient will require admission the hospital for further medical management.   [PR]    Clinical Course User Index [PR] Merlyn Lot, MD     As part of my medical decision making, I reviewed the following data within the [place] notes reviewed and incorporated, Labs reviewed, notes from prior ED visits.   ____________________________________________   FINAL CLINICAL IMPRESSION(S) / ED DIAGNOSES  Final diagnoses:  Sepsis, due to unspecified organism ([place])  Acute respiratory failure with hypoxia ([place])  Influenza A      NEW MEDICATIONS STARTED DURING THIS VISIT:  New Prescriptions   No medications on file     Note:  This document was prepared using Dragon voice recognition software and may include unintentional dictation errors.    Merlyn Lot, MD 10/30/17 [phone]

## 2017-10-30 NOTE — Progress Notes (Signed)
Pharmacist - Prescriber Communication  Tamiflu dose modified from 75 mg po BID to 30 mg po BID due to creatinine clearance 30 to 60 mL/min.  Tatem Holsonback A. [place], [place].D., BCPS Clinical Pharmacist 10/30/17 11:50

## 2017-10-30 NOTE — Consult Note (Signed)
[facility] Consultation      Name: Martha [name] MRN: [id number] DOB: 01/10/1946    ADMISSION DATE:  10/30/2017 CONSULTATION DATE:  10/30/2017  REFERRING MD :  Epifanio Lesches, MD   CHIEF COMPLAINT:     Altered Mental status   HISTORY OF PRESENT ILLNESS    Martha [name] is a 72 y.o. female with a history of thyroid cancer as well as glioblastoma status post biopsy and currently in hospice care at the [facility] presents with altered mental status since this morning.  Patient normally able to walk and communicate but was noncommunicative this morning.  No report of falls.  Patient does appear short of breath mild tachypnea but normal O2 saturations.  Unable to provide any additional history.  No further history available by EMS.  Will try to contact facility staff and family.  Patient subsequently had CT scan of the head that did not show any acute pathology.  The patient's friend and POA was at the bedside and assisted with the consultation.  She reported that there were no reports of seizure activity or urinary incontinence.  She was cultured and started on ABX.  The patient was placed on BIPAP and transferred to ICU.    SIGNIFICANT EVENTS   As per HPI    PAST MEDICAL HISTORY    :  Past Medical History:  Diagnosis Date  . Cancer [facility])    thyroid cancer treated with radioactive iodine  . Depression   . Diabetes mellitus without complication ([place])   . Epilepsy ([place])   . Glioblastoma determined by biopsy of brain ([place])   . Hyperlipidemia   . Hypertension   . Thyroid disease    h/o thyroid ca- 30 years ago  . Varicose vein    Past Surgical History:  Procedure Laterality Date  . APPLICATION OF CRANIAL NAVIGATION N/A 08/02/2015   Procedure: APPLICATION OF CRANIAL NAVIGATION;  Surgeon: Consuella Lose, MD;  Location: [place] NEURO ORS;  Service: Neurosurgery;  Laterality: N/A;  APPLICATION OF CRANIAL NAVIGATION  . BILATERAL CARPAL  TUNNEL RELEASE  1990,1991   left hand in 1990 right hand in '91  . CERVICAL SPINE SURGERY  05/1997   spinal fusion  . CHOLECYSTECTOMY  04/24/2008   status post laparoscopic Dr.Ely  . CRANIOTOMY Right 08/02/2015   Procedure: CRANIOTOMY TUMOR EXCISION;  Surgeon: Consuella Lose, MD;  Location: [place] NEURO ORS;  Service: Neurosurgery;  Laterality: Right;  CRANIOTOMY TUMOR EXCISION  . radioactive iodine    . THYROIDECTOMY, PARTIAL  1964   due to cancerous tumor  . TOE SURGERY Left 1989   Prior to Admission medications   Medication Sig Start Date End Date Taking? Authorizing Provider  acetaminophen (TYLENOL) 500 MG tablet Take 500-1,000 mg by mouth daily as needed.   Yes [provider]  amLODipine (NORVASC) 5 MG tablet Take 1 tablet (5 mg total) by mouth daily. 04/09/17  Yes Carmon Ginsberg, PA  aspirin-acetaminophen-caffeine (EXCEDRIN MIGRAINE) [phone] MG tablet Take 2 tablets by mouth as directed.   Yes [provider]  dexamethasone (DECADRON) 1 MG tablet Take 1 tablet (1 mg total) by mouth daily with breakfast. Patient taking differently: Take 3 mg by mouth daily with breakfast.  10/14/17  Yes Cammie Sickle, MD  feeding supplement, ENSURE ENLIVE, (ENSURE ENLIVE) LIQD Take 237 mLs by mouth 3 (three) times daily between meals. 03/04/17  Yes Wieting, Richard, MD  glipiZIDE (GLUCOTROL) 5 MG tablet Take 5 mg by mouth 2 (two)  times daily.   Yes [provider]  levETIRAcetam (KEPPRA) 500 MG tablet Take 1 tablet (500 mg total) by mouth 2 (two) times daily. Reported on 12/11/2015 04/09/17  Yes Carmon Ginsberg, PA  levothyroxine (SYNTHROID, LEVOTHROID) 100 MCG tablet Take 1 tablet (100 mcg total) by mouth daily. 04/09/17  Yes Carmon Ginsberg, PA  lisinopril (PRINIVIL,ZESTRIL) 20 MG tablet Take 1 tablet by mouth daily. 05/16/17  Yes [provider]  metoCLOPramide (REGLAN) 10 MG tablet Take 10 mg by mouth every 6 (six) hours as needed for nausea.   Yes [provider]  promethazine (PHENERGAN) 25 MG tablet Take 25 mg by mouth every 6 (six) hours as needed for nausea or vomiting.   Yes [provider]  metFORMIN (GLUCOPHAGE) 500 MG tablet Take 1 tablet (500 mg total) by mouth 2 (two) times daily with a meal. Patient not taking: Reported on 10/30/2017 09/08/17   Cammie Sickle, MD   Allergies  Allergen Reactions  . Ampicillin Other (See Comments)    Yeast rash   . Fish-Derived Products Other (See Comments)    gout  . Pravastatin     Associated with muscle aches and headaches  . Tomato Other (See Comments)    Upset stomach   . Penicillins Rash    .Has patient had a PCN reaction causing immediate rash, facial/tongue/throat swelling, SOB or lightheadedness with hypotension: Unknown Has patient had a PCN reaction causing severe rash involving mucus membranes or skin necrosis: Unknown Has patient had a PCN reaction that required hospitalization: Unknown Has patient had a PCN reaction occurring within the last 10 years: Unknown If all of the above answers are "NO", then may proceed with Cephalosporin use.      FAMILY HISTORY   Family History  Problem Relation Age of Onset  . Arthritis Mother   . Hyperlipidemia Mother   . Hypertension Mother   . Heart disease Mother   . Diabetes Mother        type 2  . Thyroid disease Mother   . Cancer Father        colon  . Alcohol abuse Brother   . Hyperlipidemia Brother   . Hypertension Brother   . Breast cancer Neg Hx   . Ovarian cancer Neg Hx       SOCIAL HISTORY    reports that she has quit smoking. She has never used smokeless tobacco. She reports that she does not drink alcohol or use drugs.  ROS Patient is not a brilliant historian.  She has short term memory deficits.  She did admit to recent headaches and abdominal pain from constipation. She has not had the flu shot.  CONSTITUTIONAL: Fatigue, EYES: No blurred or double vision.  EARS, NOSE, AND THROAT: No  tinnitus or ear pain.  RESPIRATORY cough, shortness of breath cARDIOVASCULAR: No chest pain, orthopnea, edema.  GASTROINTESTINAL: No nausea, vomiting, diarrhea or abdominal pain.  GENITOURINARY: No dysuria, hematuria.  ENDOCRINE: No polyuria, nocturia,  HEMATOLOGY: No anemia, easy bruising or bleeding SKIN: No rash or lesion. MUSCULOSKELETAL: No joint pain or arthritis.   NEUROLOGIC: No tingling, numbness, weakness.  PSYCHIATRY: No anxiety or depression.      VITAL SIGNS    Temp:  [97.9 F (36.6 C)-100.5 F (38.1 C)] 100.5 F (38.1 C) (03/23 1423) Pulse Rate:  [117-130] 118 (03/23 1500) Resp:  [23-38] 35 (03/23 1500) BP: (103-157)/(73-140) 104/74 (03/23 1500) SpO2:  [80 %-100 %] 94 % (03/23 1500) FiO2 (%):  [30 %] 30 % (  03/23 1340) Weight:  [158 lb (71.7 kg)-161 lb 9.6 oz (73.3 kg)] 161 lb 9.6 oz (73.3 kg) (03/23 1423) HEMODYNAMICS:   VENTILATOR SETTINGS: FiO2 (%):  [30 %] 30 % INTAKE / OUTPUT: No intake or output data in the 24 hours ending 10/30/17 1519     PHYSICAL EXAM   Physical Exam Constitutional: Alert but disoriented and unable to contribute history, protecting airway but appears in mild resp distress 2/2 tachypnea Eyes: Conjunctivae are normal.  Head: Atraumatic. Nose: No congestion/rhinnorhea. Mouth/Throat: Mucous membranes are moist.   Neck: No stridor. Painless ROM.  Cardiovascular: tachycardic rate, regular rhythm. Grossly normal heart sounds.  Good peripheral circulation. Respiratory: tachypnea with diffuse expiratory wheeze Gastrointestinal: Soft and nontender. No distention. No abdominal bruits. No CVA tenderness. Genitourinary:  Musculoskeletal: No lower extremity tenderness nor edema.  No joint effusions. Neurologic:  MAE spontaneously, no facial droop noted, Skin:  Skin is warm, dry and intact. No rash noted.     LABS   LABS:  CBC Recent Labs  Lab 10/30/17 0824 10/30/17 1234  WBC 15.3* 11.8*  HGB 13.8 12.0  HCT 41.6 36.2  PLT 256  225   Coag's No results for input(s): APTT, INR in the last 168 hours. BMET Recent Labs  Lab 10/30/17 0824 10/30/17 1234  NA 143  --   K 3.3*  --   CL 105  --   CO2 25  --   BUN 25*  --   CREATININE 1.25* 1.34*  GLUCOSE 274*  --    Electrolytes Recent Labs  Lab 10/30/17 0824  CALCIUM 9.2   Sepsis Markers Recent Labs  Lab 10/30/17 0824  LATICACIDVEN 2.8*   ABG No results for input(s): PHART, PCO2ART, PO2ART in the last 168 hours. Liver Enzymes Recent Labs  Lab 10/30/17 0824  AST 43*  ALT 40  ALKPHOS 102  BILITOT 0.9  ALBUMIN 3.5   Cardiac Enzymes Recent Labs  Lab 10/30/17 0824  TROPONINI <0.03   Glucose Recent Labs  Lab 10/30/17 1159  GLUCAP 258*     No results found for this or any previous visit (from the past 240 hour(s)).   Current Facility-Administered Medications:  .  0.9 %  sodium chloride infusion, , Intravenous, Continuous, Epifanio Lesches, MD, Last Rate: 50 mL/hr at 10/30/17 1356 .  acetaminophen (TYLENOL) tablet 650 mg, 650 mg, Oral, Q6H PRN, 650 mg at 10/30/17 1449 **OR** acetaminophen (TYLENOL) suppository 650 mg, 650 mg, Rectal, Q6H PRN, Epifanio Lesches, MD .  amLODipine (NORVASC) tablet 5 mg, 5 mg, Oral, Daily, Epifanio Lesches, MD, 5 mg at 10/30/17 1340 .  aztreonam (AZACTAM) 2 g in sodium chloride 0.9 % 100 mL IVPB, 2 g, Intravenous, Q8H, Konidena, Snehalatha, MD .  docusate sodium (COLACE) capsule 100 mg, 100 mg, Oral, BID, Epifanio Lesches, MD, 100 mg at 10/30/17 1340 .  enoxaparin (LOVENOX) injection 40 mg, 40 mg, Subcutaneous, Q24H, Epifanio Lesches, MD, 40 mg at 10/30/17 1342 .  feeding supplement (ENSURE ENLIVE) (ENSURE ENLIVE) liquid 237 mL, 237 mL, Oral, TID BM, Epifanio Lesches, MD .  HYDROcodone-acetaminophen (NORCO/VICODIN) 5-325 MG per tablet 1-2 tablet, 1-2 tablet, Oral, Q4H PRN, Epifanio Lesches, MD .  insulin aspart (novoLOG) injection 0-15 Units, 0-15 Units, Subcutaneous, TID WC, Epifanio Lesches, MD, 8 Units at 10/30/17 1300 .  ipratropium-albuterol (DUONEB) 0.5-2.5 (3) MG/3ML nebulizer solution 3 mL, 3 mL, Nebulization, Q6H, Vianne Bulls, Snehalatha, MD, 3 mL at 10/30/17 1340 .  levETIRAcetam (KEPPRA) tablet 500 mg, 500 mg, Oral, BID, Epifanio Lesches, MD,  500 mg at 10/30/17 1355 .  [START ON 10/31/2017] levothyroxine (SYNTHROID, LEVOTHROID) tablet 100 mcg, 100 mcg, Oral, QAC breakfast, Epifanio Lesches, MD .  methylPREDNISolone sodium succinate (SOLU-MEDROL) 125 mg/2 mL injection 60 mg, 60 mg, Intravenous, Q24H, Epifanio Lesches, MD, 60 mg at 10/30/17 1339 .  metoCLOPramide (REGLAN) tablet 10 mg, 10 mg, Oral, Q6H PRN, Epifanio Lesches, MD .  oseltamivir (TAMIFLU) capsule 30 mg, 30 mg, Oral, BID, Epifanio Lesches, MD, 30 mg at 10/30/17 1355 .  potassium chloride SA (K-DUR,KLOR-CON) CR tablet 40 mEq, 40 mEq, Oral, Q4H, Epifanio Lesches, MD, 40 mEq at 10/30/17 1340 .  promethazine (PHENERGAN) tablet 25 mg, 25 mg, Oral, Q6H PRN, Epifanio Lesches, MD .  vancomycin (VANCOCIN) IVPB 750 mg/150 ml premix, 750 mg, Intravenous, Q24H, Epifanio Lesches, MD  IMAGING    Ct Head Wo Contrast  Result Date: 10/30/2017 CLINICAL DATA:  Altered mental status, weakness; history of glioblastoma post resection EXAM: CT HEAD WITHOUT CONTRAST TECHNIQUE: Contiguous axial images were obtained from the base of the skull through the vertex without intravenous contrast. Sagittal and coronal MPR images reconstructed from axial data set. COMPARISON:  12/07/2016 FINDINGS: Brain: Generalized atrophy. Stable ventricular morphology with mild ex vacuo dilatation of the temporal horn of the RIGHT lateral ventricle. No midline shift or mass effect. Dystrophic calcifications are again seen at multiple sites in the LEFT hemisphere. Small vessel chronic ischemic changes of deep cerebral white matter. Encephalomalacia at RIGHT temporal lobe likely related to prior surgery. No intracranial  hemorrhage, new mass lesion, or evidence of acute infarction. Calcification is also seen anterior to the spinal cord at the C1 level. Vascular: Atherosclerotic calcifications of the internal carotid arteries bilaterally at skull base. Skull: Prior RIGHT temporal craniotomy. Bones demineralized. No acute bone lesions. Sinuses/Orbits: Scattered mucosal thickening throughout ethmoid air cells, maxillary sinuses and sphenoid sinus. Opacified LEFT middle ear cavity and a few LEFT mastoid air cells Other: N/A IMPRESSION: Atrophy with small vessel chronic ischemic changes of deep cerebral white matter. Prior RIGHT temporal craniotomy and temporal lobe resection. No acute intracranial abnormalities. New opacification of the LEFT middle ear cavity and a few LEFT mastoid air cells. Electronically Signed   By: Lavonia Dana M.D.   On: 10/30/2017 09:22   Dg Chest Port 1 View  Result Date: 10/30/2017 CLINICAL DATA:  Respiratory distress. EXAM: PORTABLE CHEST 1 VIEW COMPARISON:  Chest x-ray dated March 08, 2017. FINDINGS: The heart size and mediastinal contours are within normal limits. Normal pulmonary vascularity. New patchy opacity at the left lung base. The right lung is clear. No pleural effusion or pneumothorax. No acute osseous abnormality. IMPRESSION: 1. Left lower lobe atelectasis versus developing pneumonia. Electronically Signed   By: Titus Dubin M.D.   On: 10/30/2017 08:52     MAJOR EVENTS/TEST RESULTS:   INDWELLING DEVICES:: Peripheral IVs  MICRO DATA: MRSA PCR  Urine  Blood Resp   ANTIMICROBIALS:  Aztreonam, Vancomycin   ASSESSMENT/PLAN    PULMONARY 1. Acute Hypoxic Respiratory failure secondary to Influenza Pneumonia on NIPPV.  2. Sepsis secondary to Influenza Pneumonia and UTI.  3. Acute Kidney injury  4. Hx of Glioblastoma on hospice care  5. Hx Adrenal Insufficiency on steroid therapy.  6. Hx of steroid induced Diabetes mellitus type 2.  7. Hx of Thyroid  cancer  Plans:  1. Continue on NIPPV and liberate for meals, wean to [place] as able 2. Continue on empirical ABX pending cultures.  Tamiflu doses renally. 3. Target glucose management 4. DVT prophylaxis  5. Steroid therapy.  Disp: patient is DNR I have updated patient and POA Brion Aliment of goals of therapy.   I have personally obtained a history, examined the patient, evaluated laboratory and independently reviewed  imaging results, formulated the assessment and plan and placed orders.  The Patient requires high complexity decision making for assessment and support, frequent evaluation and titration of therapies, application of advanced monitoring technologies and extensive interpretation of multiple databases. Critical Care Time devoted to patient care services described in this note is 40 minutes.   Overall, patient is critically ill, prognosis is guarded. Patient at high risk for cardiac arrest and death.   Cammie Sickle, M.D

## 2017-10-30 NOTE — Progress Notes (Signed)
CODE SEPSIS - PHARMACY COMMUNICATION  **Broad Spectrum Antibiotics should be administered within 1 hour of Sepsis diagnosis**  Time Code Sepsis Called/Page Received: 09:31  Antibiotics Ordered: Aztreonam/Vancomycin  Time of 1st antibiotic administration: 09:46   Olivia Canter [facility] Clinical Pharmacist  10/30/2017  9:55 AM

## 2017-10-31 DIAGNOSIS — J101 Influenza due to other identified influenza virus with other respiratory manifestations: Secondary | ICD-10-CM

## 2017-10-31 DIAGNOSIS — N3000 Acute cystitis without hematuria: Secondary | ICD-10-CM

## 2017-10-31 LAB — BASIC METABOLIC PANEL
ANION GAP: 10 (ref 5–15)
BUN: 30 mg/dL — ABNORMAL HIGH (ref 6–20)
CO2: 21 mmol/L — ABNORMAL LOW (ref 22–32)
Calcium: 7.9 mg/dL — ABNORMAL LOW (ref 8.9–10.3)
Chloride: 112 mmol/L — ABNORMAL HIGH (ref 101–111)
Creatinine, Ser: 0.95 mg/dL (ref 0.44–1.00)
GFR calc non Af Amer: 59 mL/min — ABNORMAL LOW (ref >60)
GLUCOSE: 236 mg/dL — AB (ref 65–99)
Potassium: 3.8 mmol/L (ref 3.5–5.1)
Sodium: 143 mmol/L (ref 135–145)

## 2017-10-31 LAB — CBC
HCT: 35.6 % (ref 35.0–47.0)
HEMOGLOBIN: 12 g/dL (ref 12.0–16.0)
MCH: 32.5 pg (ref 26.0–34.0)
MCHC: 33.6 g/dL (ref 32.0–36.0)
MCV: 96.7 fL (ref 80.0–100.0)
Platelets: 198 10*3/uL (ref 150–440)
RBC: 3.68 MIL/uL — ABNORMAL LOW (ref 3.80–5.20)
RDW: 13.5 % (ref 11.5–14.5)
WBC: 10.3 10*3/uL (ref 3.6–11.0)

## 2017-10-31 LAB — GLUCOSE, CAPILLARY
GLUCOSE-CAPILLARY: 225 mg/dL — AB (ref 65–99)
GLUCOSE-CAPILLARY: 140 mg/dL — AB (ref 65–99)
GLUCOSE-CAPILLARY: 272 mg/dL — AB (ref 65–99)
GLUCOSE-CAPILLARY: 359 mg/dL — AB (ref 65–99)
Glucose-Capillary: 336 mg/dL — ABNORMAL HIGH (ref 65–99)
Glucose-Capillary: 353 mg/dL — ABNORMAL HIGH (ref 65–99)
Glucose-Capillary: 366 mg/dL — ABNORMAL HIGH (ref 65–99)

## 2017-10-31 LAB — MAGNESIUM: MAGNESIUM: 1.8 mg/dL (ref 1.7–2.4)

## 2017-10-31 LAB — PHOSPHORUS: Phosphorus: 3.3 mg/dL (ref 2.5–4.6)

## 2017-10-31 MED ORDER — DEXTROSE 50 % IV SOLN: 25.0000 mL | INTRAVENOUS | Status: DC | PRN

## 2017-10-31 MED ORDER — INSULIN ASPART 100 UNIT/ML ~~LOC~~ SOLN
0.0000 [IU] | Freq: Three times a day (TID) | SUBCUTANEOUS | Status: DC
  Administered 2017-10-31: 15 [IU] via SUBCUTANEOUS
  Administered 2017-11-01 (×3): 5 [IU] via SUBCUTANEOUS
  Administered 2017-11-01: 8 [IU] via SUBCUTANEOUS
  Administered 2017-11-02: 5 [IU] via SUBCUTANEOUS
  Administered 2017-11-02: 2 [IU] via SUBCUTANEOUS
  Filled 2017-10-31 (×7): qty 1

## 2017-10-31 MED ORDER — DEXTROSE-NACL 5-0.45 % IV SOLN: INTRAVENOUS | Status: DC

## 2017-10-31 MED ORDER — INSULIN REGULAR BOLUS VIA INFUSION
0.0000 [IU] | Freq: Three times a day (TID) | INTRAVENOUS | Status: DC
  Filled 2017-10-31: qty 10

## 2017-10-31 MED ORDER — SODIUM CHLORIDE 0.9 % IV SOLN
INTRAVENOUS | Status: DC
  Administered 2017-10-31: 08:00:00 via INTRAVENOUS

## 2017-10-31 MED ORDER — INSULIN ASPART 100 UNIT/ML ~~LOC~~ SOLN
0.0000 [IU] | Freq: Three times a day (TID) | SUBCUTANEOUS | Status: DC
  Administered 2017-10-31: 15 [IU] via SUBCUTANEOUS
  Administered 2017-10-31: 8 [IU] via SUBCUTANEOUS
  Filled 2017-10-31 (×2): qty 1

## 2017-10-31 MED ORDER — SODIUM CHLORIDE 0.9 % IV SOLN
INTRAVENOUS | Status: DC
  Filled 2017-10-31: qty 1

## 2017-10-31 MED ORDER — GLIPIZIDE 5 MG PO TABS
5.0000 mg | ORAL_TABLET | Freq: Every day | ORAL | Status: DC
  Administered 2017-11-01 – 2017-11-02 (×2): 5 mg via ORAL
  Filled 2017-10-31 (×2): qty 1

## 2017-10-31 MED ORDER — MAGNESIUM SULFATE 2 GM/50ML IV SOLN
2.0000 g | Freq: Once | INTRAVENOUS | Status: AC
Start: 2017-10-31 — End: 2017-10-31
  Administered 2017-10-31: 2 g via INTRAVENOUS
  Filled 2017-10-31: qty 50

## 2017-10-31 NOTE — Progress Notes (Signed)
PULMONARY / CRITICAL CARE MEDICINE   Name: Martha [name] MRN: [id number] DOB: 08/01/1946    ADMISSION DATE:  10/30/2017  HISTORY OF PRESENT ILLNESS:   Martha [name] a 72 y.o.femalewith a history of thyroid cancer as well as glioblastoma status post biopsy and currently in hosp[facility] care at the [facility] presents with altered mental status since this morning. Patient normally able to walk and communicate but was noncommunicative this morning. No report of falls. Patient does appear short of breath mild tachypnea but normal O2 saturations. Unable to provide any additional history. No further history available by EMS. Will try to contact facility staff and family.  Patient subsequently had CT scan of the head that did not show any acute pathology.  The patient's friend and POA was at the bedside and assisted with the consultation.  She reported that there were no reports of seizure activity or urinary incontinence.  She was cultured and started on ABX.   REVIEW OF SYSTEMS:   No chest pain, no SOB. Still having intermittent headaches  SUBJECTIVE:  Patient reports that she feels better today  VITAL SIGNS: BP (!) 85/65   Pulse 91   Temp 97.9 F (36.6 C) (Oral)   Resp 18   Ht 5' (1.524 m)   Wt 161 lb 9.6 oz (73.3 kg)   SpO2 100%   BMI 31.56 kg/m   HEMODYNAMICS:    VENTILATOR SETTINGS: FiO2 (%):  [4 %-30 %] 30 %  INTAKE / OUTPUT: I/O last 3 completed shifts: In: 1393.3 [P.O.:240; I.V.:803.3; IV Piggyback:350] Out: 400 [Urine:400]  PHYSICAL EXAMINATION: General:  Comfortable without distress Neuro:  AAA x 3, no deficits HEENT: PERRL, moist mucous membranes Cardiovascular:  RRR, no murmurs Lungs:  ocassional wheezes Abdomen:  Obese, non tender Musculoskeletal:  No deformity Skin:  Warm and dry  LABS:  BMET Recent Labs  Lab 10/30/17 0824 10/30/17 1234 10/31/17 0454  NA 143  --  143  K 3.3*  --  3.8  CL 105  --  112*  CO2 25  --  21*  BUN  25*  --  30*  CREATININE 1.25* 1.34* 0.95  GLUCOSE 274*  --  236*    Electrolytes Recent Labs  Lab 10/30/17 0824 10/31/17 0454  CALCIUM 9.2 7.9*  MG  --  1.8  PHOS  --  3.3    CBC Recent Labs  Lab 10/30/17 0824 10/30/17 1234 10/31/17 0454  WBC 15.3* 11.8* 10.3  HGB 13.8 12.0 12.0  HCT 41.6 36.2 35.6  PLT 256 225 198    Coag's No results for input(s): APTT, INR in the last 168 hours.  Sepsis Markers Recent Labs  Lab 10/30/17 0824 10/30/17 1511  LATICACIDVEN 2.8* 2.6*    ABG No results for input(s): PHART, PCO2ART, PO2ART in the last 168 hours.  Liver Enzymes Recent Labs  Lab 10/30/17 0824  AST 43*  ALT 40  ALKPHOS 102  BILITOT 0.9  ALBUMIN 3.5    Cardiac Enzymes Recent Labs  Lab 10/30/17 0824  TROPONINI <0.03    Glucose Recent Labs  Lab 10/30/17 1159 10/30/17 1848 10/30/17 2213 10/31/17 0001 10/31/17 0444 10/31/17 0802  GLUCAP 258* 392* 340* 366* 225* 140*    Imaging No results found.   STUDIES:  nil  CULTURES: Influenza A positive  ANTIBIOTICS: 3/23 Tamiflu, Aztreonam, Vancomycin  SIGNIFICANT EVENTS: nil  LINES/TUBES: Peripheral Ivs  DISCUSSION: 72 year old lady with his of brain cancer and on hospice admitted with Influenza pneumonia.  ASSESSMENT / PLAN:  1. Acute Hypoxic Respiratory failure secondary to Influenza Pneumonia on NIPPV. Patient has been off BIPAP now and oxygenating well on [place] oxygen.  2. Sepsis secondary to Influenza Pneumonia and UTI. Much improved. Urine culture is still pending.  3. Acute Kidney injury is improving  4. Hx of Glioblastoma on hospice care  5. Hx Adrenal Insufficiency on steroid therapy.  6. Hx of steroid induced Diabetes mellitus type 2. Blood sugars has trended upwards  7. Hx of Thyroid cancer  8. Hypomagnesemia- replaced.  Plans:  1. Wean Oxygen as able 2. Continue on empirical ABX pending cultures.  Tamiflu doses renally. Will D/C Vancomycin as MRSA is  negative. 3. Target glucose management. Will restart Glucotrol 4. DVT prophylaxis 5. Steroid therapy.  Disp: patient is DNR on hospice care, Out of bed, patient can be transferred out of ICU pending bed availability.      FAMILY  - Updates: will update when available     Cammie Sickle, MD Pulmonary and [place] Pager: [phone]  10/31/2017, 11:59 AM

## 2017-10-31 NOTE — Progress Notes (Signed)
[place] [facility] [place] NAME: Martha [name]    MR#:  [id number]  DATE OF BIRTH:  [birth year]  SUBJECTIVE:  CHIEF COMPLAINT: Shortness of breath is better.  Denies any complaints other than cough.  Body aches are better  REVIEW OF SYSTEMS:  CONSTITUTIONAL: No fever, fatigue or weakness.  EYES: No blurred or double vision.  EARS, NOSE, AND THROAT: No tinnitus or ear pain.  RESPIRATORY: reporting cough, exertional shortness of breath, denies wheezing or hemoptysis.  CARDIOVASCULAR: No chest pain, orthopnea, edema.  GASTROINTESTINAL: No nausea, vomiting, diarrhea or abdominal pain.  GENITOURINARY: No dysuria, hematuria.  ENDOCRINE: No polyuria, nocturia,  HEMATOLOGY: No anemia, easy bruising or bleeding SKIN: No rash or lesion. MUSCULOSKELETAL: No joint pain or arthritis.   NEUROLOGIC: No tingling, numbness, weakness.  PSYCHIATRY: No anxiety or depression.   DRUG ALLERGIES:   Allergies  Allergen Reactions  . Ampicillin Other (See Comments)    Yeast rash   . Fish-Derived Products Other (See Comments)    gout  . Pravastatin     Associated with muscle aches and headaches  . Tomato Other (See Comments)    Upset stomach   . Penicillins Rash    .Has patient had a PCN reaction causing immediate rash, facial/tongue/throat swelling, SOB or lightheadedness with hypotension: Unknown Has patient had a PCN reaction causing severe rash involving mucus membranes or skin necrosis: Unknown Has patient had a PCN reaction that required hospitalization: Unknown Has patient had a PCN reaction occurring within the last 10 years: Unknown If all of the above answers are "NO", then may proceed with Cephalosporin use.     VITALS:  Blood pressure (!) 85/65, pulse 91, temperature 97.9 F (36.6 C), temperature source Oral, resp. rate 18, height 5' (1.524 m), weight 73.3 kg (161 lb 9.6 oz), SpO2 100 %.  PHYSICAL EXAMINATION:  GENERAL:  72 y.o.-year-old  patient lying in the bed with no acute distress.  EYES: Pupils equal, round, reactive to light and accommodation. No scleral icterus. Extraocular muscles intact.  HEENT: Head atraumatic, normocephalic. Oropharynx and nasopharynx congested  NECK:  Supple, no jugular venous distention. No thyroid enlargement, no tenderness.  LUNGS: mod breath sounds bilaterally, no wheezing, rales,rhonchi or crepitation. No use of accessory muscles of respiration.  CARDIOVASCULAR: S1, S2 normal. No murmurs, rubs, or gallops.  ABDOMEN: Soft, nontender, nondistended. Bowel sounds present. No organomegaly or mass.  EXTREMITIES: No pedal edema, cyanosis, or clubbing.  NEUROLOGIC: Cranial nerves II through XII are intact. Muscle strength 5/5 in all extremities. Sensation intact. Gait not checked.  PSYCHIATRIC: The patient is alert and oriented x 3.  SKIN: No obvious rash, lesion, or ulcer.    LABORATORY PANEL:   CBC Recent Labs  Lab 10/31/17 0454  WBC 10.3  HGB 12.0  HCT 35.6  PLT 198   ------------------------------------------------------------------------------------------------------------------  Chemistries  Recent Labs  Lab 10/30/17 0824  10/31/17 0454  NA 143  --  143  K 3.3*  --  3.8  CL 105  --  112*  CO2 25  --  21*  GLUCOSE 274*  --  236*  BUN 25*  --  30*  CREATININE 1.25*   < > 0.95  CALCIUM 9.2  --  7.9*  MG  --   --  1.8  AST 43*  --   --   ALT 40  --   --   ALKPHOS 102  --   --   BILITOT 0.9  --   --    < > =  values in this interval not displayed.   ------------------------------------------------------------------------------------------------------------------  Cardiac Enzymes Recent Labs  Lab 10/30/17 0824  TROPONINI <0.03   ------------------------------------------------------------------------------------------------------------------  RADIOLOGY:  Ct Head Wo Contrast  Result Date: 10/30/2017 CLINICAL DATA:  Altered mental status, weakness; history of  glioblastoma post resection EXAM: CT HEAD WITHOUT CONTRAST TECHNIQUE: Contiguous axial images were obtained from the base of the skull through the vertex without intravenous contrast. Sagittal and coronal MPR images reconstructed from axial data set. COMPARISON:  12/07/2016 FINDINGS: Brain: Generalized atrophy. Stable ventricular morphology with mild ex vacuo dilatation of the temporal horn of the RIGHT lateral ventricle. No midline shift or mass effect. Dystrophic calcifications are again seen at multiple sites in the LEFT hemisphere. Small vessel chronic ischemic changes of deep cerebral white matter. Encephalomalacia at RIGHT temporal lobe likely related to prior surgery. No intracranial hemorrhage, new mass lesion, or evidence of acute infarction. Calcification is also seen anterior to the spinal cord at the C1 level. Vascular: Atherosclerotic calcifications of the internal carotid arteries bilaterally at skull base. Skull: Prior RIGHT temporal craniotomy. Bones demineralized. No acute bone lesions. Sinuses/Orbits: Scattered mucosal thickening throughout ethmoid air cells, maxillary sinuses and sphenoid sinus. Opacified LEFT middle ear cavity and a few LEFT mastoid air cells Other: N/A IMPRESSION: Atrophy with small vessel chronic ischemic changes of deep cerebral white matter. Prior RIGHT temporal craniotomy and temporal lobe resection. No acute intracranial abnormalities. New opacification of the LEFT middle ear cavity and a few LEFT mastoid air cells. Electronically Signed   By: Lavonia Dana M.D.   On: 10/30/2017 09:22   Dg Chest Port 1 View  Result Date: 10/30/2017 CLINICAL DATA:  Respiratory distress. EXAM: PORTABLE CHEST 1 VIEW COMPARISON:  Chest x-ray dated March 08, 2017. FINDINGS: The heart size and mediastinal contours are within normal limits. Normal pulmonary vascularity. New patchy opacity at the left lung base. The right lung is clear. No pleural effusion or pneumothorax. No acute osseous  abnormality. IMPRESSION: 1. Left lower lobe atelectasis versus developing pneumonia. Electronically Signed   By: Titus Dubin M.D.   On: 10/30/2017 08:52    EKG:   Orders placed or performed during the hospital encounter of 10/30/17  . ED EKG 12-Lead  . ED EKG 12-Lead  . EKG 12-Lead  . 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 Aletha Halim note, comes from the [facility] because of altered mental status, weakness found to have pneumonia, influenza.  #1. acute respiratory failure with hypoxia secondary to pneumonia,  influenza:  Off Bipap  continue vancomycin, cefepime Tamiflu  appreciate and intensivist following the patient.  2.  COPD exacerbation: Patient is on bronchodilators, steroids, oxygen.  3.  Sepsis present on admission with evidence of tachycardia, leukocytosis, slightly elevated lactic acid up to 2.8.: Blood cultures with no growth so far Pending urine culture MRSA PCR negative  continue IV fluids.  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  Continue sliding scale with coverage at this time.  #5 history of adrenalinsufficiency: Patient on low-dose steroid at home, continue IV steroids.  6.  History of thyroid cancer, patient on Synthyroid.  7.  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  Given overall declining performance, patient preference, poor tolerance to chemotherapy patient is currently on hospice.    CODE STATUS DNR/DNI.   Plan of care discussed with intensivist  All the records are  reviewed and case discussed with Care Management/Social Workerr. Management plans discussed with the patient, family and they are in agreement.   TOTAL TIME TAKING CARE OF THIS PATIENT: 34 minutes.   POSSIBLE D/C IN 1-2  DAYS, DEPENDING ON CLINICAL CONDITION.  Note: This dictation was prepared with Dragon dictation along with smaller phrase technology. Any transcriptional errors that result from this process are unintentional.   Nicholes Mango M.D on 10/31/2017 at 3:12 PM  Between 7am to 6pm - Pager - [phone] After 6pm go to [email or web address] - password [email or web address] [facility]  [place] [facility]  Office  [phone]  CC: Primary care physician; Carmon Ginsberg, [place]

## 2017-10-31 NOTE — Progress Notes (Signed)
bipap on standby at this time, pt on 5l [place] and tol well

## 2017-11-01 LAB — BASIC METABOLIC PANEL
ANION GAP: 8 (ref 5–15)
BUN: 44 mg/dL — ABNORMAL HIGH (ref 6–20)
CHLORIDE: 113 mmol/L — AB (ref 101–111)
CO2: 22 mmol/L (ref 22–32)
Calcium: 8.5 mg/dL — ABNORMAL LOW (ref 8.9–10.3)
Creatinine, Ser: 1.16 mg/dL — ABNORMAL HIGH (ref 0.44–1.00)
GFR calc Af Amer: 54 mL/min — ABNORMAL LOW (ref >60)
GFR, EST NON AFRICAN AMERICAN: 46 mL/min — AB (ref >60)
GLUCOSE: 325 mg/dL — AB (ref 65–99)
POTASSIUM: 3.9 mmol/L (ref 3.5–5.1)
SODIUM: 143 mmol/L (ref 135–145)

## 2017-11-01 LAB — GLUCOSE, CAPILLARY
GLUCOSE-CAPILLARY: 207 mg/dL — AB (ref 65–99)
Glucose-Capillary: 223 mg/dL — ABNORMAL HIGH (ref 65–99)
Glucose-Capillary: 300 mg/dL — ABNORMAL HIGH (ref 65–99)
Glucose-Capillary: 234 mg/dL — ABNORMAL HIGH (ref 65–99)

## 2017-11-01 LAB — MAGNESIUM: MAGNESIUM: 2.5 mg/dL — AB (ref 1.7–2.4)

## 2017-11-01 MED ORDER — METHYLPREDNISOLONE SODIUM SUCC 40 MG IJ SOLR
40.0000 mg | Freq: Two times a day (BID) | INTRAMUSCULAR | Status: DC
  Administered 2017-11-01 – 2017-11-02 (×2): 40 mg via INTRAVENOUS
  Filled 2017-11-01 (×2): qty 1

## 2017-11-01 MED ORDER — SODIUM CHLORIDE 0.9 % IV SOLN
1.0000 g | Freq: Two times a day (BID) | INTRAVENOUS | Status: DC
  Administered 2017-11-01 – 2017-11-02 (×2): 1 g via INTRAVENOUS
  Filled 2017-11-01 (×5): qty 1

## 2017-11-01 NOTE — Clinical Social Work Note (Signed)
Clinical Social Work Assessment  Patient Details  Name: Martha [name] MRN: [id number] Date of Birth: Aug 05, 1946  Date of referral:  11/01/17               Reason for consult:  Other (Comment Required)(From [facility] with [facility]. )                Permission sought to share information with:  Chartered certified accountant granted to share information::  Yes, Verbal Permission Granted  Name::      [facility]   Agency::     Relationship::     Contact Information:     Housing/Transportation Living arrangements for the past 2 months:  [place] of Information:  Patient, Facility Patient Interpreter Needed:  None Criminal Activity/Legal Involvement Pertinent to Current Situation/Hospitalization:  No - Comment as needed Significant Relationships:  Friend Lives with:  Facility Resident Do you feel safe going back to the place where you live?  Yes Need for family participation in patient care:  Yes (Comment)  Care giving concerns:  Patient is a resident at [facility] followed by [facility]. ALF fax: [phone].    Social Worker assessment / plan:  Holiday representative ([place]) reviewed chart and noted that patient is from [facility] followed by [facility]. Per [facility] liaison patient has oxygen at [facility]. CSW met with patient and her friend/ HPOA Martha [name] [phone] was at bedside. Patient was alert and oriented X4 and was laying in the bed. CSW introduced self and explained role of CSW department. Patient reported that she has been at [facility] for about 1 month now and has an Radiation protection practitioner. Patient reported that she is on hospice care and plans on returning to [facility]. Patient will need EMS for transport. FL2 complete.   CSW contacted Scientist, research (life sciences) for [facility] and made him aware of above. CSW made Dustin aware that patient is positive for the flu and is very limited with mobility.  Rachel Bo reported that patient has a private room and can return to [facility] when stable. CSW will continue to follow and assist as needed.   Employment status:  Disabled (Comment on whether or not currently receiving Disability), Retired Insurance underwriter information:  Other (Comment Required)([facility]. ) PT Recommendations:  [place] / Referral to community resources:  Other (Comment Required)(Patient will D/C back to ALF and continue hospice. )  Patient/Family's Response to care:  Patient is agreeable to return to [facility].   Patient/Family's Understanding of and Emotional Response to Diagnosis, Current Treatment, and Prognosis:  Patient and her friend Martha [name] were very pleasant and thanked CSW for visit.   Emotional Assessment Appearance:  Appears stated age Attitude/Demeanor/Rapport:    Affect (typically observed):  Accepting, Adaptable, Pleasant Orientation:  Oriented to Self, Oriented to Place, Oriented to  Time, Oriented to Situation Alcohol / Substance use:  Not Applicable Psych involvement (Current and /or in the community):  No (Comment)  Discharge Needs  Concerns to be addressed:  Discharge Planning Concerns Readmission within the last 30 days:  No Current discharge risk:  Dependent with Mobility, Chronically ill Barriers to Discharge:  Continued Medical Work up   [employer], Veronia Beets, LCSW 11/01/2017, 4:19 PM

## 2017-11-01 NOTE — Progress Notes (Signed)
Visit made. Patient is currently followed by [facility] at the [facility] with a hospice diagnosis of Malignancy of the brain. She is a DNR code with out of facility DNR in place. This was not sent with her. Facility staff sent patient to the [facility] ED for evaluation of weakness and slurred speech. Hospice was not notified prior to transport. She was admitted for respiratory failure and tested positive for influenza A and is receiving Tamiflu and IV antiobiotics. Please note patient does have oxygen in place at [facility], just placed there on Friday 3/22. she was NOT on chronic oxygen prior to admission. Transfer summary placed in patient's chart. Hospital care team aware of Hospice involvement.  Patient seen lying in bed, eyes closed appeared to be sleeping. Did not awaken to gentle voice. Chart notes reviewed, she is taking oral medications. Has not received any PRN medications for pain/dyspnea, Tylenol given on 3/23 for elevated temperature.  Appetite fair. Will continue to follow and update hospice team. Flo Shanks RN, BSN, [place] [facility] [place], [facility] [phone]

## 2017-11-01 NOTE — Progress Notes (Addendum)
[place] at [place] NAME: Martha [name]    MR#:  [id number]  DATE OF BIRTH:  [birth year]  SUBJECTIVE:  CHIEF COMPLAINT: Shortness of breath is better.  Denies any complaints other than cough.  Reports gen weakness  REVIEW OF SYSTEMS:  CONSTITUTIONAL: No fever, fatigue , reports weakness.  EYES: No blurred or double vision.  EARS, NOSE, AND THROAT: No tinnitus or ear pain.  RESPIRATORY: reporting cough, exertional shortness of breath, denies wheezing or hemoptysis.  CARDIOVASCULAR: No chest pain, orthopnea, edema.  GASTROINTESTINAL: No nausea, vomiting, diarrhea or abdominal pain.  GENITOURINARY: No dysuria, hematuria.  ENDOCRINE: No polyuria, nocturia,  HEMATOLOGY: No anemia, easy bruising or bleeding SKIN: No rash or lesion. MUSCULOSKELETAL: No joint pain or arthritis.   NEUROLOGIC: No tingling, numbness, weakness.  PSYCHIATRY: No anxiety or depression.   DRUG ALLERGIES:   Allergies  Allergen Reactions  . Ampicillin Other (See Comments)    Yeast rash   . Fish-Derived Products Other (See Comments)    gout  . Pravastatin     Associated with muscle aches and headaches  . Tomato Other (See Comments)    Upset stomach   . Penicillins Rash    .Has patient had a PCN reaction causing immediate rash, facial/tongue/throat swelling, SOB or lightheadedness with hypotension: Unknown Has patient had a PCN reaction causing severe rash involving mucus membranes or skin necrosis: Unknown Has patient had a PCN reaction that required hospitalization: Unknown Has patient had a PCN reaction occurring within the last 10 years: Unknown If all of the above answers are "NO", then may proceed with Cephalosporin use.     VITALS:  Blood pressure 121/72, pulse 93, temperature 97.9 F (36.6 C), temperature source Oral, resp. rate 20, height 5' (1.524 m), weight 75.7 kg (166 lb 14.2 oz), SpO2 95 %.  PHYSICAL EXAMINATION:  GENERAL:  72  y.o.-year-old patient lying in the bed with no acute distress.  EYES: Pupils equal, round, reactive to light and accommodation. No scleral icterus. Extraocular muscles intact.  HEENT: Head atraumatic, normocephalic. Oropharynx and nasopharynx congested  NECK:  Supple, no jugular venous distention. No thyroid enlargement, no tenderness.  LUNGS: mod breath sounds bilaterally, no wheezing, rales,rhonchi or crepitation. No use of accessory muscles of respiration.  CARDIOVASCULAR: S1, S2 normal. No murmurs, rubs, or gallops.  ABDOMEN: Soft, nontender, nondistended. Bowel sounds present. No organomegaly or mass.  EXTREMITIES: No pedal edema, cyanosis, or clubbing.  NEUROLOGIC: Cranial nerves II through XII are intact. Muscle strength 5/5 in all extremities. Sensation intact. Gait not checked.  PSYCHIATRIC: The patient is alert and oriented x 3.  SKIN: No obvious rash, lesion, or ulcer.    LABORATORY PANEL:   CBC Recent Labs  Lab 10/31/17 0454  WBC 10.3  HGB 12.0  HCT 35.6  PLT 198   ------------------------------------------------------------------------------------------------------------------  Chemistries  Recent Labs  Lab 10/30/17 0824  11/01/17 0401  NA 143   < > 143  K 3.3*   < > 3.9  CL 105   < > 113*  CO2 25   < > 22  GLUCOSE 274*   < > 325*  BUN 25*   < > 44*  CREATININE 1.25*   < > 1.16*  CALCIUM 9.2   < > 8.5*  MG  --    < > 2.5*  AST 43*  --   --   ALT 40  --   --   ALKPHOS 102  --   --  BILITOT 0.9  --   --    < > = values in this interval not displayed.   ------------------------------------------------------------------------------------------------------------------  Cardiac Enzymes Recent Labs  Lab 10/30/17 0824  TROPONINI <0.03   ------------------------------------------------------------------------------------------------------------------  RADIOLOGY:  No results found.  EKG:   Orders placed or performed during the hospital encounter of  10/30/17  . ED EKG 12-Lead  . ED EKG 12-Lead  . EKG 12-Lead  . 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 Aletha Halim note, comes from the [facility] because of altered mental status, weakness found to have pneumonia, influenza.  #1. acute respiratory failure with hypoxia secondary to pneumonia,  influenza:  Off Bipap  neg MRSA PCR , D/C ed Vanc On aztreonam Tamiflu  appreciate and intensivist following the patient.  2.  COPD exacerbation: Patient is on bronchodilators, steroids, oxygen.  3.  Sepsis present on admission with evidence of tachycardia, leukocytosis, slightly elevated lactic acid up to 2.8.: Blood cultures with no growth so far urine culture greater than 100,000 colonies of Klebsiella pneumonia sensitivities pending MRSA PCR negative  continue IV fluids. 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  Continue sliding scale with coverage at this time.  #5 history of adrenalinsufficiency: Patient on low-dose steroid at home, continue IV steroids.  6.  History of thyroid cancer, patient on Synthyroid.  7.  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  Given overall declining performance, patient preference, poor tolerance to chemotherapy patient is currently on hospice.    8. Weakness -PT  CODE STATUS DNR/DNI.   Plan of care discussed with intensivist  All the records are reviewed and case discussed with Care Management/Social Workerr. Management plans discussed with the patient, family and  they are in agreement.   TOTAL TIME TAKING CARE OF THIS PATIENT: 34 minutes.   POSSIBLE D/C IN 1-2  DAYS, DEPENDING ON CLINICAL CONDITION.  Note: This dictation was prepared with Dragon dictation along with smaller phrase technology. Any transcriptional errors that result from this process are unintentional.   Nicholes Mango M.D on 11/01/2017 at 2:53 PM  Between 7am to 6pm - Pager - [phone] After 6pm go to [email or web address] - password [email or web address] [facility]  [place] [facility]  Office  [phone]  CC: Primary care physician; Carmon Ginsberg, [place]

## 2017-11-01 NOTE — Evaluation (Signed)
Physical Therapy Evaluation Patient Details Name: Martha [name] MRN: [id number] DOB: 04-19-1946 Today's Date: 11/01/2017   History of Present Illness  Pt is a54 y.o.femalewith a known history of angioma status post surgery and radiation followed by Hospice comes from [facility] for altered mental status, generalized weakness. Facility staff noticed that patient not able to get out of bed with associated with shortness of breath. Pt placed on BiPAP initially.  She said that he has been having shortness of breath for about 3 weeks and now worse associated with cough. Patient noted to have type Ainfluenza, pneumonia.  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      Clinical Impression  Pt presents with deficits in strength, transfers, mobility, gait, balance, and activity tolerance.  Pt found in room on room air with pt admitting to removing nasal canula without nursing consent.  Pt's SpO2 on room air was 83% with HR 110 bpm.  Nursing notified and per nursing request SpO2 returned to 2LO2/min.  Pt's SpO2 increased to 93% in less than 2 min on 2LO2/min.  Pt education provided regarding importance of communication with nursing prior to removing O2 going forward with pt understanding and agreeing.  Pt required extra time and effort with bed mobility tasks and sit to/from stand transfers but no physical assistance.  Pt only able to amb 3-4' before fatiguing and requiring to return to sitting. Pt's SpO2 on 2LO2/min dropped from 93 to 88% after amb with her HR remaining in the low 110s.  Pt's SpO2 returned to 91-92% after sitting for 1-2 min with verbal cues for PLB.  Pt will benefit from PT services in a SNF setting upon discharge to safely  address above deficits for decreased caregiver assistance and eventual return to PLOF.      Follow Up Recommendations SNF    Equipment Recommendations  None recommended by PT    Recommendations for Other Services       Precautions / Restrictions Precautions Precautions: Fall Restrictions Weight Bearing Restrictions: No      Mobility  Bed Mobility Overal bed mobility: Modified Independent             General bed mobility comments: Extra time and effort required for bed mobility tasks but no physical assistance  Transfers Overall transfer level: Needs assistance Equipment used: Rolling walker (2 wheeled) Transfers: Sit to/from Stand Sit to Stand: Min guard         General transfer comment: Extra time and effort during transfers but no physical assistance required  Ambulation/Gait Ambulation/Gait assistance: Min guard Ambulation Distance (Feet): 4 Feet Assistive device: Rolling walker (2 wheeled) Gait Pattern/deviations: Step-through pattern;Decreased step length - right;Decreased step length - left   Gait velocity interpretation: Below normal speed for age/gender General Gait Details: Pt fatigued quickly during amb and required to return to sitting with SpO2 on 2LO2/min dropping from 93 to 88% and HR remaining in the low 110s.  Stairs            Wheelchair Mobility    Modified Rankin (Stroke Patients Only)       Balance Overall balance assessment: Needs assistance Sitting-balance support: Feet unsupported;Feet supported;No upper extremity supported Sitting balance-Leahy Scale: Good     Standing balance support: No upper extremity supported;Bilateral upper extremity supported;During functional activity Standing balance-Leahy Scale: Good  Pertinent Vitals/Pain Pain Assessment: No/denies pain    Home Living Family/patient expects to be discharged to:: Assisted living               Home  Equipment: Walker - 2 wheels;Walker - 4 wheels;Cane - single point      Prior Function Level of Independence: Needs assistance   Gait / Transfers Assistance Needed: Pt Mod Ind with amb in facility with a SPC  ADL's / Homemaking Assistance Needed: Pt Ind with bathing and dressing with assist from ALF staff for meal prep and medications.         Hand Dominance   Dominant Hand: Right    Extremity/Trunk Assessment   Upper Extremity Assessment Upper Extremity Assessment: Overall WFL for tasks assessed    Lower Extremity Assessment Lower Extremity Assessment: Generalized weakness       Communication   Communication: No difficulties  Cognition Arousal/Alertness: Awake/alert Behavior During Therapy: WFL for tasks assessed/performed Overall Cognitive Status: Within Functional Limits for tasks assessed                                        General Comments      Exercises Total Joint Exercises Ankle Circles/Pumps: AROM;Both;10 reps Quad Sets: Strengthening;Both;10 reps Gluteal Sets: Strengthening;Both;10 reps Towel Squeeze: Strengthening;Both;10 reps Heel Slides: AROM;Both;5 reps Hip ABduction/ADduction: AROM;Both;5 reps Long Arc Quad: AROM;Both;10 reps Knee Flexion: AROM;Both;10 reps   Assessment/Plan    PT Assessment Patient needs continued PT services  PT Problem List Decreased strength;Decreased activity tolerance;Decreased balance;Decreased mobility       PT Treatment Interventions DME instruction;Gait training;Functional mobility training;Balance training;Therapeutic activities;Therapeutic exercise;Patient/family education    PT Goals (Current goals can be found in the Care Plan section)  Acute Rehab PT Goals Patient Stated Goal: To walk better PT Goal Formulation: With patient Time For Goal Achievement: 11/14/17 Potential to Achieve Goals: Good    Frequency Min 2X/week   Barriers to discharge Decreased caregiver support       Co-evaluation               AM-PAC PT "6 Clicks" Daily Activity  Outcome Measure Difficulty turning over in bed (including adjusting bedclothes, sheets and blankets)?: A Little Difficulty moving from lying on back to sitting on the side of the bed? : A Little Difficulty sitting down on and standing up from a chair with arms (e.g., wheelchair, bedside commode, etc,.)?: Unable Help needed moving to and from a bed to chair (including a wheelchair)?: A Little Help needed walking in hospital room?: A Lot Help needed climbing 3-5 steps with a railing? : Total 6 Click Score: 13    End of Session Equipment Utilized During Treatment: Gait belt;Oxygen Activity Tolerance: Patient limited by fatigue Patient left: with call bell/phone within reach;with bed alarm set;in bed;with family/visitor present Nurse Communication: Mobility status;Other (comment)(Pt's SpO2 on room air) PT Visit Diagnosis: Muscle weakness (generalized) (M62.81);Difficulty in walking, not elsewhere classified (R26.2)    Time: 1340-1435 PT Time Calculation (min) (ACUTE ONLY): 55 min   Charges:   PT Evaluation $PT Eval Low Complexity: 1 Low PT Treatments $Therapeutic Exercise: 8-22 mins $Therapeutic Activity: 8-22 mins   PT G Codes:        DRoyetta Asal PT, DPT 11/01/17, 3:25 PM

## 2017-11-01 NOTE — Progress Notes (Signed)
Inpatient Diabetes Program Recommendations  AACE/ADA: New Consensus Statement on Inpatient Glycemic Control (2015)  Target Ranges:  Prepandial:   less than 140 mg/dL      Peak postprandial:   less than 180 mg/dL (1-2 hours)      Critically ill patients:  140 - 180 mg/dL  Results for [facility] A (MRN [id number]) as of 11/01/2017 11:32  Ref. Range 10/31/2017 08:02 10/31/2017 14:25 10/31/2017 16:21 10/31/2017 18:02 10/31/2017 21:37 11/01/2017 07:41  Glucose-Capillary Latest Ref Range: 65 - 99 mg/dL 140 (H) 272 (H) 336 (H) 359 (H) 353 (H) 223 (H)    Review of Glycemic Control  Diabetes history: DM2 Outpatient Diabetes medications: Glipizide 5 mg BID, Metformin 500 mg BID Current orders for Inpatient glycemic control: Glipizide 5 mg QAM, Novolog 0-15 units ACHS; Solumedrol 60 mg Q24H  Inpatient Diabetes Program Recommendations: Insulin - Meal Coverage: If steroids are continued and if post prandial glucose is consistently over 180 mg/dl, please consider ordering Novolog 4 units TID with meals for meal coverage if patient eats at least 50% of meals. Oral Agents: Noted Glipizide was ordered today and patient has already received this morning.  Thanks, Barnie Alderman, RN, MSN, CDE Diabetes Coordinator Inpatient Diabetes Program [phone] (Team Pager from 8am to 5pm)

## 2017-11-01 NOTE — NC FL2 (Addendum)
[place] LEVEL OF CARE SCREENING TOOL     IDENTIFICATION  Patient Name: Martha [name] Birthdate: 11-05-1945 Sex: female Admission Date (Current Location): 10/30/2017  [facility] and [place] Number:  Engineering geologist and Address:  [facility], [address], [place], [place] [address]      Provider Number: [id number]  Attending Physician Name and Address:  Nicholes Mango, MD  Relative Name and Phone Number:       Current Level of Care: Hospital Recommended Level of Care: [place] Prior Approval Number:    Date Approved/Denied:   PASRR Number:    Discharge Plan: Domiciliary (Rest home)    Current Diagnoses: Patient Active Problem List   Diagnosis Date Noted  . Acute respiratory failure with hypoxemia ([place]) 10/30/2017  . Pulmonary nodule, right 03/08/2017  . Colon polyp 03/08/2017  . Poor fluid intake   . Right hip pain 10/14/2016  . Thyroid cancer ([place]) 10/14/2016  . CKD (chronic kidney disease) stage 3, GFR 30-59 ml/min (HCC) 07/06/2016  . Gout 04/06/2016  . Glioblastoma determined by biopsy of brain ([place]) 08/27/2015  . S/P craniotomy 08/02/2015  . HLD (hyperlipidemia) 12/24/2014  . Adult hypothyroidism 12/24/2014  . Headache, migraine 12/24/2014  . Essential hypertension 05/29/2014  . Type 2 diabetes mellitus ([place]) 05/29/2014  . Seizure disorder ([place]) 05/29/2014  . Adiposity 05/29/2014    Orientation RESPIRATION BLADDER Height & Weight     Self, Time, Situation, Place  O2(2 Liters Oxygen. ) Continent Weight: 166 lb 14.2 oz (75.7 kg) Height:  5' (152.4 cm)  BEHAVIORAL SYMPTOMS/MOOD NEUROLOGICAL BOWEL NUTRITION STATUS      Continent Diet(Diet: Carb Modified. )  AMBULATORY STATUS COMMUNICATION OF NEEDS Skin   Extensive Assist(Uses electric wheel chair. ) Verbally Normal                       Personal Care Assistance Level of Assistance  Bathing, Feeding, Dressing Bathing Assistance: Limited  assistance Feeding assistance: Independent Dressing Assistance: Limited assistance     Functional Limitations Info  Sight, Hearing, Speech Sight Info: Adequate Hearing Info: Adequate Speech Info: Adequate    SPECIAL CARE FACTORS FREQUENCY  (Followed by [facility]. )                    Contractures      Additional Factors Info  Code Status, Allergies, Isolation Precautions Code Status Info: (DNR ) Allergies Info: (Ampicillin, Fish-derived Products, Pravastatin, Tomato, Penicillins)     Isolation Precautions Info: (isolation for the flu. )     Current Medications (11/01/2017):  This is the current hospital active medication list Current Facility-Administered Medications  Medication Dose Route Frequency Provider Last Rate Last Dose  . 0.9 %  sodium chloride infusion   Intravenous Continuous Epifanio Lesches, MD 50 mL/hr at 10/31/17 1438    . acetaminophen (TYLENOL) tablet 650 mg  650 mg Oral Q6H PRN Epifanio Lesches, MD   650 mg at 10/30/17 1449   Or  . acetaminophen (TYLENOL) suppository 650 mg  650 mg Rectal Q6H PRN Epifanio Lesches, MD      . amLODipine (NORVASC) tablet 5 mg  5 mg Oral Daily Epifanio Lesches, MD   5 mg at 11/01/17 0949  . docusate sodium (COLACE) capsule 100 mg  100 mg Oral BID Epifanio Lesches, MD   100 mg at 11/01/17 0949  . enoxaparin (LOVENOX) injection 40 mg  40 mg Subcutaneous Q24H Epifanio Lesches, MD  40 mg at 11/01/17 0947  . feeding supplement (ENSURE ENLIVE) (ENSURE ENLIVE) liquid 237 mL  237 mL Oral TID BM Epifanio Lesches, MD   237 mL at 11/01/17 1317  . glipiZIDE (GLUCOTROL) tablet 5 mg  5 mg Oral QAC breakfast Lafayette Dragon, MD   5 mg at 11/01/17 0948  . HYDROcodone-acetaminophen (NORCO/VICODIN) 5-325 MG per tablet 1-2 tablet  1-2 tablet Oral Q4H PRN Epifanio Lesches, MD      . insulin aspart (novoLOG) injection 0-15 Units  0-15 Units Subcutaneous TID AC & HS Lance Coon, MD   5 Units at 11/01/17  1316  . ipratropium-albuterol (DUONEB) 0.5-2.5 (3) MG/3ML nebulizer solution 3 mL  3 mL Nebulization Q6H Epifanio Lesches, MD   3 mL at 11/01/17 1458  . levETIRAcetam (KEPPRA) tablet 500 mg  500 mg Oral BID Epifanio Lesches, MD   500 mg at 11/01/17 0948  . levothyroxine (SYNTHROID, LEVOTHROID) tablet 100 mcg  100 mcg Oral QAC breakfast Epifanio Lesches, MD   100 mcg at 11/01/17 0948  . meropenem (MERREM) 1 g in sodium chloride 0.9 % 100 mL IVPB  1 g Intravenous Q12H Hitoshi Werts, MD      . methylPREDNISolone sodium succinate (SOLU-MEDROL) 40 mg/mL injection 40 mg  40 mg Intravenous Q12H Shawnita Krizek, MD      . metoCLOPramide (REGLAN) tablet 10 mg  10 mg Oral Q6H PRN Epifanio Lesches, MD      . oseltamivir (TAMIFLU) capsule 30 mg  30 mg Oral BID Epifanio Lesches, MD   30 mg at 11/01/17 0948  . promethazine (PHENERGAN) tablet 25 mg  25 mg Oral Q6H PRN Epifanio Lesches, MD         Discharge Medications: Please see discharge summary for a list of discharge medications.  Relevant Imaging Results:  Relevant Lab Results:   Additional Information (SSN: [id number])  Sample, Veronia Beets, LCSW

## 2017-11-02 LAB — URINE CULTURE

## 2017-11-02 LAB — GLUCOSE, CAPILLARY
GLUCOSE-CAPILLARY: 144 mg/dL — AB (ref 65–99)
Glucose-Capillary: 229 mg/dL — ABNORMAL HIGH (ref 65–99)

## 2017-11-02 MED ORDER — PREDNISONE 10 MG (21) PO TBPK: 10.0000 mg | ORAL_TABLET | Freq: Every day | ORAL | 0 refills | Status: DC

## 2017-11-02 MED ORDER — OSELTAMIVIR PHOSPHATE 30 MG PO CAPS: 30.0000 mg | ORAL_CAPSULE | Freq: Two times a day (BID) | ORAL | 0 refills | Status: DC

## 2017-11-02 MED ORDER — DEXAMETHASONE 1 MG PO TABS: 1.0000 mg | ORAL_TABLET | Freq: Every day | ORAL | 0 refills | Status: AC

## 2017-11-02 MED ORDER — CIPROFLOXACIN HCL 500 MG PO TABS: 500.0000 mg | ORAL_TABLET | Freq: Two times a day (BID) | ORAL | 0 refills | Status: AC

## 2017-11-02 NOTE — Progress Notes (Signed)
Patient constantly pulling at O2 tubing and tele.  Placed on soft mit restraints

## 2017-11-02 NOTE — Progress Notes (Signed)
Patient is medically stable for D/C back to [facility] and will continue [facility] care. Per [employer] resident care coordinator at [facility] patient can return today. RN will call report and arrange EMS for transport. Clinical Education officer, museum (CSW) sent D/C orders to [facility]. Per [facility] hospice liaison patient has oxygen through hospice at [facility]. CSW contacted patient's friend/ HPOA [name] and made her aware of above. Please reconsult if future social work needs arise. CSW signing off.   [employer], LCSW [phone]

## 2017-11-02 NOTE — Plan of Care (Signed)
Dr. Text to inform that patient is asking to be discharge.  Dr. Aletha Halim assess when able.

## 2017-11-02 NOTE — Progress Notes (Signed)
Patient is confused and constantly takes off gown, tele monitor and nasal cannula.  Patients O2 drops in the low 80's on room air.  Verbal reminders provided to keep O2, tele and gown on

## 2017-11-02 NOTE — Progress Notes (Signed)
Notified by staff RN's Ander Purpura and Rodman Key that patient is discharging back to the [facility] today. She will return via EMS. Hospice team updated. Discharge summary faxed to triage/medical records. Flo Shanks RN, BSN, [place] and [facility] [place], hospital Liaison [phone]

## 2017-11-02 NOTE — Progress Notes (Signed)
Inpatient Diabetes Program Recommendations  AACE/ADA: New Consensus Statement on Inpatient Glycemic Control (2015)  Target Ranges:  Prepandial:   less than 140 mg/dL      Peak postprandial:   less than 180 mg/dL (1-2 hours)      Critically ill patients:  140 - 180 mg/dL   Results for [facility] A (MRN [id number]) as of 11/02/2017 10:34  Ref. Range 11/01/2017 07:41 11/01/2017 11:51 11/01/2017 17:04 11/01/2017 21:20 11/02/2017 08:21  Glucose-Capillary Latest Ref Range: 65 - 99 mg/dL 223 (H) 207 (H) 300 (H) 234 (H) 229 (H)   Review of Glycemic Control  Diabetes history: DM2 Outpatient Diabetes medications: Glipizide 5 mg BID, Metformin 500 mg BID Current orders for Inpatient glycemic control: Glipizide 5 mg QAM, Novolog 0-15 units ACHS; Solumedrol 40 mg Q12H  Inpatient Diabetes Program Recommendations: Insulin - Meal Coverage: If steroids are continued, please consider ordering Novolog 5 units TID with meals for meal coverage if patient eats at least 50% of meals.  Thanks, Barnie Alderman, RN, MSN, CDE Diabetes Coordinator Inpatient Diabetes Program [phone] (Team Pager from 8am to 5pm)

## 2017-11-02 NOTE — Progress Notes (Signed)
Pt prepared for d/c to SNF. IV d/c'd. Skin intact except as charted in most recent assessments. Vitals are stable. Report called to receiving facility. Pt to be transported by ambulance service. Pt agrees to leave oxygen on at 3L and left with mask on due to positive flu results.  Martha [name] [employer]

## 2017-11-02 NOTE — Plan of Care (Signed)
  Problem: Education: Goal: Knowledge of General Education information will improve Outcome: Not Progressing   Problem: Health Behavior/Discharge Planning: Goal: Ability to manage health-related needs will improve Outcome: Not Progressing   Problem: Clinical Measurements: Goal: Ability to maintain clinical measurements within normal limits will improve Outcome: Not Progressing Goal: Will remain free from infection Outcome: Progressing Goal: Diagnostic test results will improve Outcome: Progressing Goal: Respiratory complications will improve Outcome: Progressing Goal: Cardiovascular complication will be avoided Outcome: Progressing   Problem: Activity: Goal: Risk for activity intolerance will decrease Outcome: Not Progressing   Problem: Nutrition: Goal: Adequate nutrition will be maintained Outcome: Progressing   Problem: Coping: Goal: Level of anxiety will decrease Outcome: Not Progressing
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
## 2017-11-02 NOTE — NC FL2 (Signed)
[place] LEVEL OF CARE SCREENING TOOL     IDENTIFICATION  Patient Name: Martha [name] Birthdate: 01/07/1946 Sex: female Admission Date (Current Location): 10/30/2017  [facility] and [place] Number:  Engineering geologist and Address:  [facility], [address], [place], [place] [address]      Provider Number: [id number]  Attending Physician Name and Address:  Nicholes Mango, MD  Relative Name and Phone Number:       Current Level of Care: Hospital Recommended Level of Care: [place] Prior Approval Number:    Date Approved/Denied:   PASRR Number:    Discharge Plan: Domiciliary (Rest home)    Current Diagnoses: Patient Active Problem List   Diagnosis Date Noted  . Acute respiratory failure with hypoxemia ([place]) 10/30/2017  . Pulmonary nodule, right 03/08/2017  . Colon polyp 03/08/2017  . Poor fluid intake   . Right hip pain 10/14/2016  . Thyroid cancer ([place]) 10/14/2016  . CKD (chronic kidney disease) stage 3, GFR 30-59 ml/min (HCC) 07/06/2016  . Gout 04/06/2016  . Glioblastoma determined by biopsy of brain ([place]) 08/27/2015  . S/P craniotomy 08/02/2015  . HLD (hyperlipidemia) 12/24/2014  . Adult hypothyroidism 12/24/2014  . Headache, migraine 12/24/2014  . Essential hypertension 05/29/2014  . Type 2 diabetes mellitus ([place]) 05/29/2014  . Seizure disorder ([place]) 05/29/2014  . Adiposity 05/29/2014    Orientation RESPIRATION BLADDER Height & Weight     Self, Time, Situation, Place  O2(2 Liters Oxygen. ) Continent Weight: 166 lb 0.1 oz (75.3 kg) Height:  5' (152.4 cm)  BEHAVIORAL SYMPTOMS/MOOD NEUROLOGICAL BOWEL NUTRITION STATUS      Continent Diet(Diet: Carb Modified. )  AMBULATORY STATUS COMMUNICATION OF NEEDS Skin   Extensive Assist(Uses electric wheel chair. ) Verbally Normal                       Personal Care Assistance Level of Assistance  Bathing, Feeding, Dressing Bathing Assistance: Limited  assistance Feeding assistance: Independent Dressing Assistance: Limited assistance     Functional Limitations Info  Sight, Hearing, Speech Sight Info: Adequate Hearing Info: Adequate Speech Info: Adequate    SPECIAL CARE FACTORS FREQUENCY  (Followed by [facility]. )                    Contractures      Additional Factors Info  Code Status, Allergies, Isolation Precautions Code Status Info: (DNR ) Allergies Info: (Ampicillin, Fish-derived Products, Pravastatin, Tomato, Penicillins)     Isolation Precautions Info: (isolation for the flu. )    Discharge Medications: Please see discharge summary for a list of discharge medications. Medication List    STOP taking these medications   metFORMIN 500 MG tablet Commonly known as:  GLUCOPHAGE     TAKE these medications   acetaminophen 500 MG tablet Commonly known as:  TYLENOL Take 500-1,000 mg by mouth daily as needed.   amLODipine 5 MG tablet Commonly known as:  NORVASC Take 1 tablet (5 mg total) by mouth daily.   aspirin-acetaminophen-caffeine 250-250-65 MG tablet Commonly known as:  EXCEDRIN MIGRAINE Take 2 tablets by mouth as directed.   ciprofloxacin 500 MG tablet Commonly known as:  CIPRO Take 1 tablet (500 mg total) by mouth 2 (two) times daily for 7 days.   dexamethasone 1 MG tablet Commonly known as:  DECADRON Take 1 tablet (1 mg total) by mouth daily with breakfast. Resume Decadron after completing prednisone taper What changed:  additional  instructions   feeding supplement (ENSURE ENLIVE) Liqd Take 237 mLs by mouth 3 (three) times daily between meals.   glipiZIDE 5 MG tablet Commonly known as:  GLUCOTROL Take 5 mg by mouth 2 (two) times daily.   levETIRAcetam 500 MG tablet Commonly known as:  KEPPRA Take 1 tablet (500 mg total) by mouth 2 (two) times daily. Reported on 12/11/2015   levothyroxine 100 MCG tablet Commonly known as:  SYNTHROID, LEVOTHROID Take 1 tablet (100 mcg  total) by mouth daily.   lisinopril 20 MG tablet Commonly known as:  PRINIVIL,ZESTRIL Take 1 tablet by mouth daily.   metoCLOPramide 10 MG tablet Commonly known as:  REGLAN Take 10 mg by mouth every 6 (six) hours as needed for nausea.   oseltamivir 30 MG capsule Commonly known as:  TAMIFLU Take 1 capsule (30 mg total) by mouth 2 (two) times daily.   predniSONE 10 MG (21) Tbpk tablet Commonly known as:  STERAPRED UNI-PAK 21 TAB Take 1 tablet (10 mg total) by mouth daily. Take 6 tablets by mouth for 1 day followed by  5 tablets by mouth for 1 day followed by  4 tablets by mouth for 1 day followed by  3 tablets by mouth for 1 day followed by  2 tablets by mouth for 1 day followed by  1 tablet by mouth for a day and stop   promethazine 25 MG tablet Commonly known as:  PHENERGAN Take 25 mg by mouth every 6 (six) hours as needed for nausea or vomiting.   Relevant Imaging Results: Relevant Lab Results: Additional Information (SSN: [id number])  Cristopher Ciccarelli, Veronia Beets, LCSW

## 2017-11-02 NOTE — Progress Notes (Signed)
Visit made. Pateint seen lying in bed, restless, requesting to get up. Writer engaged staff RN Rodman Key and aide Darlina Guys to get patient cleaned, changed and up to the recliner. Patient able to stand and shuffle with assistance. Lunch tray set up and patient eating lunch at end of visit. She is now requiring oxygen at 3 liters due to lower oxygen saturations over night. Per discussion with staff RN Rodman Key, patient be encouraged to use the BIPAP tonight. No discharge planned at this time. She continues on IV antibiotics and Tamiflu. Telephone call to [pateint's friend [name] to provide support.  Will continue to follow and update hospice team. Flo Shanks RN, BSN, [facility] and [facility] [place], hospital Liaison [phone]

## 2017-11-02 NOTE — Progress Notes (Signed)
Chaplain learned of Pts pending release and wanted to say good bye.    11/02/17 1500  Clinical Encounter Type  Visited With Patient  Visit Type Follow-up  Referral From [place]

## 2017-11-02 NOTE — Discharge Summary (Addendum)
[facility] NAME: Martha [name]    MR#:  [id number]  DATE OF BIRTH:  March 05, 1946  DATE OF ADMISSION:  10/30/2017 ADMITTING PHYSICIAN: Epifanio Lesches, MD  DATE OF DISCHARGE:  11/02/17  PRIMARY CARE PHYSICIAN: Carmon Ginsberg, PA    ADMISSION DIAGNOSIS:  Influenza A [J10.1] Acute respiratory failure with hypoxia ([place]) [J96.01] Sepsis, due to unspecified organism ([place]) [A41.9]  DISCHARGE DIAGNOSIS:  Active Problems:   Acute respiratory failure with hypoxemia ([place])  influenza SECONDARY DIAGNOSIS:   Past Medical History:  Diagnosis Date  . Cancer [facility])    thyroid cancer treated with radioactive iodine  . Depression   . Diabetes mellitus without complication ([place])   . Epilepsy ([place])   . 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 Hyperlipidemia   . Hypertension   . Thyroid disease    h/o thyroid ca- 30 years ago  . Varicose vein     HOSPITAL COURSE:  HPI  Martha [name]  is a 72 y.o. female with a known history of angioma status post surgery, radiation comes from [facility] for altered mental status, generalized weakness.  Patient staff noticed that patient not able to get out of bed this morning associated with shortness of breath.  Patient is on BiPAP now.  She said that he has been having shortness of breath for about 3 weeks and today got worse associated with cough.  Patient noted to have type A influenza, pneumonia.  Patient right now on BiPAP so we are admitting her to stepdown  #1.acute respiratory failure with hypoxia secondary to pneumonia,  influenza:  Off Bipap  neg MRSA PCR , D/C ed Vanc On aztreonam, changed to ciprofloxacin Tamiflu need to be given for 3 more doses to complete the course    2. COPD exacerbation: Patient is on bronchodilators, steroids, oxygen.  Taper p.o. steroids and resume her home dose steroids after tapering  3. Sepsis present on admission with  evidence of tachycardia, leukocytosis, slightly elevated lactic acid up to 2.8.: Blood cultures with no growth so far urine culture greater than 100,000 colonies of Klebsiella pneumonia sensitive to ciprofloxacin MRSA PCR negative  continue IV fluids. Aztreonam changed to p.o. 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 Continue sliding scale with coverage at this time.  #5 history ofadrenalinsufficiency: Patient on low-dose steroid at home, taper steroids and resume home dose steroids  6. History of thyroid cancer, patient on Synthyroid.  7. History of right temporal glioblastoma multi-formestatus post resection followed by chemotherapy, patient not on further chemotherapy secondary to intolerance. Given overall declining performance, patient preference, poor tolerance to chemotherapy patient is currently on hospice.   8. Weakness -PT recommending skilled nursing facility but patient prefers going home will resume home care with hospice  According to the healthcare [place] patient is getting hospice care at home Follow-up with social worker to resume the same     DISCHARGE CONDITIONS:   [place]   CONSULTS OBTAINED:     PROCEDURES bipap  DRUG ALLERGIES:   Allergies  Allergen Reactions  . Ampicillin Other (See Comments)    Yeast rash   . Fish-Derived Products Other (See Comments)    gout  . Pravastatin     Associated with muscle aches and headaches  . Tomato Other (See Comments)  Upset stomach   . Penicillins Rash    .Has patient had a PCN reaction causing immediate rash, facial/tongue/throat swelling, SOB or lightheadedness with hypotension: Unknown Has patient had a PCN reaction causing severe rash involving mucus membranes or skin necrosis: Unknown Has patient had a PCN reaction that required hospitalization:  Unknown Has patient had a PCN reaction occurring within the last 10 years: Unknown If all of the above answers are "NO", then may proceed with Cephalosporin use.     DISCHARGE MEDICATIONS:   Allergies as of 11/02/2017      Reactions   Ampicillin Other (See Comments)   Yeast rash    Fish-derived Products Other (See Comments)   gout   Pravastatin    Associated with muscle aches and headaches   Tomato Other (See Comments)   Upset stomach    Penicillins Rash   .Has patient had a PCN reaction causing immediate rash, facial/tongue/throat swelling, SOB or lightheadedness with hypotension: Unknown Has patient had a PCN reaction causing severe rash involving mucus membranes or skin necrosis: Unknown Has patient had a PCN reaction that required hospitalization: Unknown Has patient had a PCN reaction occurring within the last 10 years: Unknown If all of the above answers are "NO", then may proceed with Cephalosporin use.      Medication List    STOP taking these medications   metFORMIN 500 MG tablet Commonly known as:  GLUCOPHAGE     TAKE these medications   acetaminophen 500 MG tablet Commonly known as:  TYLENOL Take 500-1,000 mg by mouth daily as needed.   amLODipine 5 MG tablet Commonly known as:  NORVASC Take 1 tablet (5 mg total) by mouth daily.   aspirin-acetaminophen-caffeine 250-250-65 MG tablet Commonly known as:  EXCEDRIN MIGRAINE Take 2 tablets by mouth as directed.   ciprofloxacin 500 MG tablet Commonly known as:  CIPRO Take 1 tablet (500 mg total) by mouth 2 (two) times daily for 7 days.   dexamethasone 1 MG tablet Commonly known as:  DECADRON Take 1 tablet (1 mg total) by mouth daily with breakfast. Resume Decadron after completing prednisone taper What changed:  additional instructions   feeding supplement (ENSURE ENLIVE) Liqd Take 237 mLs by mouth 3 (three) times daily between meals.   glipiZIDE 5 MG tablet Commonly known as:  GLUCOTROL Take 5 mg by  mouth 2 (two) times daily.   levETIRAcetam 500 MG tablet Commonly known as:  KEPPRA Take 1 tablet (500 mg total) by mouth 2 (two) times daily. Reported on 12/11/2015   levothyroxine 100 MCG tablet Commonly known as:  SYNTHROID, LEVOTHROID Take 1 tablet (100 mcg total) by mouth daily.   lisinopril 20 MG tablet Commonly known as:  PRINIVIL,ZESTRIL Take 1 tablet by mouth daily.   metoCLOPramide 10 MG tablet Commonly known as:  REGLAN Take 10 mg by mouth every 6 (six) hours as needed for nausea.   oseltamivir 30 MG capsule Commonly known as:  TAMIFLU Take 1 capsule (30 mg total) by mouth 2 (two) times daily.   predniSONE 10 MG (21) Tbpk tablet Commonly known as:  STERAPRED UNI-PAK 21 TAB Take 1 tablet (10 mg total) by mouth daily. Take 6 tablets by mouth for 1 day followed by  5 tablets by mouth for 1 day followed by  4 tablets by mouth for 1 day followed by  3 tablets by mouth for 1 day followed by  2 tablets by mouth for 1 day followed by  1 tablet by mouth for a  day and stop   promethazine 25 MG tablet Commonly known as:  PHENERGAN Take 25 mg by mouth every 6 (six) hours as needed for nausea or vomiting.        DISCHARGE INSTRUCTIONS:   Follow-up with the primary care physician at the facility in 3-5 days or sooner as needed Continue oxygen via nasal cannula Resume hospice care at home  DIET:  Diabetic diet  DISCHARGE CONDITION:  Fair  ACTIVITY:  Activity as tolerated  OXYGEN:  Home Oxygen: Yes.     Oxygen Delivery: 2 liters/min via Patient connected to nasal cannula oxygen  DISCHARGE LOCATION:  home   If you experience worsening of your admission symptoms, develop shortness of breath, life threatening emergency, suicidal or homicidal thoughts you must seek medical attention immediately by calling 911 or calling your MD immediately  if symptoms less severe.  You Must read complete instructions/literature along with all the possible adverse reactions/side  effects for all the Medicines you take and that have been prescribed to you. Take any new Medicines after you have completely understood and accpet all the possible adverse reactions/side effects.   Please note  You were cared for by a hospitalist during your hospital stay. If you have any questions about your discharge medications or the care you received while you were in the hospital after you are discharged, you can call the unit and asked to speak with the hospitalist on call if the hospitalist that took care of you is not available. Once you are discharged, your primary care physician will handle any further medical issues. Please note that NO REFILLS for any discharge medications will be authorized once you are discharged, as it is imperative that you return to your primary care physician (or establish a relationship with a primary care physician if you do not have one) for your aftercare needs so that they can reassess your need for medications and monitor your lab values.     Today  Chief Complaint  Patient presents with  . Altered Mental Status   Pt feels fine , denies sob , wants to go home.  Discussed with patient's healthcare power of attorney she is agreeable  ROS:  CONSTITUTIONAL: Denies fevers, chills. Denies any fatigue, weakness.  EYES: Denies blurry vision, double vision, eye pain. EARS, NOSE, THROAT: Denies tinnitus, ear pain, hearing loss. RESPIRATORY: Denies cough, wheeze, shortness of breath.  CARDIOVASCULAR: Denies chest pain, palpitations, edema.  GASTROINTESTINAL: Denies nausea, vomiting, diarrhea, abdominal pain. Denies bright red blood per rectum. GENITOURINARY: Denies dysuria, hematuria. ENDOCRINE: Denies nocturia or thyroid problems. HEMATOLOGIC AND LYMPHATIC: Denies easy bruising or bleeding. SKIN: Denies rash or lesion. MUSCULOSKELETAL: Denies pain in neck, back, shoulder, knees, hips or arthritic symptoms.  NEUROLOGIC: Denies paralysis, paresthesias.   PSYCHIATRIC: Denies anxiety or depressive symptoms.   VITAL SIGNS:  Blood pressure 134/81, pulse 97, temperature (!) 97.4 F (36.3 C), temperature source Oral, resp. rate 18, height 5' (1.524 m), weight 75.3 kg (166 lb 0.1 oz), SpO2 91 %.  I/O:    Intake/Output Summary (Last 24 hours) at 11/02/2017 1538 Last data filed at 11/02/2017 1100 Gross per 24 hour  Intake 1520.83 ml  Output 400 ml  Net 1120.83 ml    PHYSICAL EXAMINATION:  GENERAL:  72 y.o.-year-old patient lying in the bed with no acute distress.  EYES: Pupils equal, round, reactive to light and accommodation. No scleral icterus. Extraocular muscles intact.  HEENT: Head atraumatic, normocephalic. Oropharynx and nasopharynx clear.  NECK:  Supple, no jugular  venous distention. No thyroid enlargement, no tenderness.  LUNGS: Normal breath sounds bilaterally, no wheezing, rales,rhonchi or crepitation. No use of accessory muscles of respiration.  CARDIOVASCULAR: S1, S2 normal. No murmurs, rubs, or gallops.  ABDOMEN: Soft, non-tender, non-distended. Bowel sounds present. No organomegaly or mass.  EXTREMITIES: No pedal edema, cyanosis, or clubbing.  NEUROLOGIC: Cranial nerves II through XII are intact. Muscle strength 5/5 in all extremities. Sensation intact. Gait not checked.  PSYCHIATRIC: The patient is alert and oriented x 3.  SKIN: No obvious rash, lesion, or ulcer.   DATA REVIEW:   CBC Recent Labs  Lab 10/31/17 0454  WBC 10.3  HGB 12.0  HCT 35.6  PLT 198    Chemistries  Recent Labs  Lab 10/30/17 0824  11/01/17 0401  NA 143   < > 143  K 3.3*   < > 3.9  CL 105   < > 113*  CO2 25   < > 22  GLUCOSE 274*   < > 325*  BUN 25*   < > 44*  CREATININE 1.25*   < > 1.16*  CALCIUM 9.2   < > 8.5*  MG  --    < > 2.5*  AST 43*  --   --   ALT 40  --   --   ALKPHOS 102  --   --   BILITOT 0.9  --   --    < > = values in this interval not displayed.    Cardiac Enzymes Recent Labs  Lab 10/30/17 0824  TROPONINI <0.03     Microbiology Results  Results for orders placed or performed during the hospital encounter of 10/30/17  Blood Culture (routine x 2)     Status: None (Preliminary result)   Collection Time: 10/30/17  8:24 AM  Result Value Ref Range Status   Specimen Description BLOOD LEFT ANTECUBITAL  Final   Special Requests   Final    BOTTLES DRAWN AEROBIC AND ANAEROBIC Blood Culture adequate volume   Culture   Final    NO GROWTH 3 DAYS Performed at [facility], [address] [place], [place] [address]    Report Status PENDING  Incomplete  Blood Culture (routine x 2)     Status: None (Preliminary result)   Collection Time: 10/30/17  8:24 AM  Result Value Ref Range Status   Specimen Description BLOOD BLOOD RIGHT WRIST  Final   Special Requests   Final    BOTTLES DRAWN AEROBIC AND ANAEROBIC Blood Culture adequate volume   Culture   Final    NO GROWTH 3 DAYS Performed at [facility], [address] [place], [place] [address]    Report Status PENDING  Incomplete  Urine Culture     Status: Abnormal   Collection Time: 10/30/17  9:43 AM  Result Value Ref Range Status   Specimen Description   Final    URINE, CATHETERIZED Performed at [facility], [address], [place], [place] [address]    Special Requests   Final    NONE Performed at [facility], [address] [place], [place] [address]    Culture >=100,000 COLONIES/mL KLEBSIELLA PNEUMONIAE (A)  Final   Report Status 11/02/2017 FINAL  Final   Organism ID, Bacteria KLEBSIELLA PNEUMONIAE (A)  Final      Susceptibility   Klebsiella pneumoniae - MIC*    AMPICILLIN >=32 RESISTANT Resistant     CEFAZOLIN <=4 SENSITIVE Sensitive     CEFTRIAXONE <=1 SENSITIVE Sensitive  CIPROFLOXACIN <=0.25 SENSITIVE Sensitive     GENTAMICIN <=1 SENSITIVE Sensitive     IMIPENEM <=0.25 SENSITIVE Sensitive     NITROFURANTOIN 128 RESISTANT Resistant     TRIMETH/SULFA <=20 SENSITIVE Sensitive      AMPICILLIN/SULBACTAM 4 SENSITIVE Sensitive     PIP/TAZO <=4 SENSITIVE Sensitive     Extended ESBL NEGATIVE Sensitive     * >=100,000 COLONIES/mL KLEBSIELLA PNEUMONIAE  MRSA PCR Screening     Status: None   Collection Time: 10/30/17 11:28 AM  Result Value Ref Range Status   MRSA by PCR NEGATIVE NEGATIVE Final    Comment:        The GeneXpert MRSA Assay (FDA approved for NASAL specimens only), is one component of a comprehensive MRSA colonization surveillance program. It is not intended to diagnose MRSA infection nor to guide or monitor treatment for MRSA infections. Performed at [facility], [address] [place], [place] [address]     RADIOLOGY:  Ct Head Wo Contrast  Result Date: 10/30/2017 CLINICAL DATA:  Altered mental status, weakness; history of glioblastoma post resection EXAM: CT HEAD WITHOUT CONTRAST TECHNIQUE: Contiguous axial images were obtained from the base of the skull through the vertex without intravenous contrast. Sagittal and coronal MPR images reconstructed from axial data set. COMPARISON:  12/07/2016 FINDINGS: Brain: Generalized atrophy. Stable ventricular morphology with mild ex vacuo dilatation of the temporal horn of the RIGHT lateral ventricle. No midline shift or mass effect. Dystrophic calcifications are again seen at multiple sites in the LEFT hemisphere. Small vessel chronic ischemic changes of deep cerebral white matter. Encephalomalacia at RIGHT temporal lobe likely related to prior surgery. No intracranial hemorrhage, new mass lesion, or evidence of acute infarction. Calcification is also seen anterior to the spinal cord at the C1 level. Vascular: Atherosclerotic calcifications of the internal carotid arteries bilaterally at skull base. Skull: Prior RIGHT temporal craniotomy. Bones demineralized. No acute bone lesions. Sinuses/Orbits: Scattered mucosal thickening throughout ethmoid air cells, maxillary sinuses and sphenoid sinus. Opacified LEFT  middle ear cavity and a few LEFT mastoid air cells Other: N/A IMPRESSION: Atrophy with small vessel chronic ischemic changes of deep cerebral white matter. Prior RIGHT temporal craniotomy and temporal lobe resection. No acute intracranial abnormalities. New opacification of the LEFT middle ear cavity and a few LEFT mastoid air cells. Electronically Signed   By: Lavonia Dana M.D.   On: 10/30/2017 09:22   Dg Chest Port 1 View  Result Date: 10/30/2017 CLINICAL DATA:  Respiratory distress. EXAM: PORTABLE CHEST 1 VIEW COMPARISON:  Chest x-ray dated March 08, 2017. FINDINGS: The heart size and mediastinal contours are within normal limits. Normal pulmonary vascularity. New patchy opacity at the left lung base. The right lung is clear. No pleural effusion or pneumothorax. No acute osseous abnormality. IMPRESSION: 1. Left lower lobe atelectasis versus developing pneumonia. Electronically Signed   By: Titus Dubin M.D.   On: 10/30/2017 08:52    EKG:   Orders placed or performed during the hospital encounter of 10/30/17  . ED EKG 12-Lead  . ED EKG 12-Lead  . EKG 12-Lead  . EKG 12-Lead      Management plans discussed with the patient, family and they are in agreement.  CODE STATUS:     Code Status Orders  (From admission, onward)        Start     Ordered   10/30/17 1200  Do not attempt resuscitation (DNR)  Continuous    Question Answer Comment  In the event of  cardiac or respiratory ARREST Do not call a "code blue"   In the event of cardiac or respiratory ARREST Do not perform Intubation, CPR, defibrillation or ACLS   In the event of cardiac or respiratory ARREST Use medication by any route, position, wound care, and other measures to relive pain and suffering. May use oxygen, suction and manual treatment of airway obstruction as needed for comfort.      10/30/17 1200    Code Status History    Date Active Date Inactive Code Status Order ID Comments User Context   10/30/2017 1030 10/30/2017  1200 Full Code [id number]  Epifanio Lesches, MD ED   03/02/2017 2130 03/04/2017 2105 Full Code [id number]  Loletha Grayer, MD ED   08/02/2015 1353 08/05/2015 2014 Full Code [id number]  Consuella Lose, MD Inpatient   07/27/2015 2126 08/02/2015 1353 DNR [id number]  Bethena Roys, MD Inpatient      TOTAL TIME TAKING CARE OF THIS PATIENT: 43 minutes.   Note: This dictation was prepared with Dragon dictation along with smaller phrase technology. Any transcriptional errors that result from this process are unintentional.   @[facility] @  on 11/02/2017 at 3:38 PM  Between 7am to 6pm - Pager - [phone]  After 6pm go to [email or web address] - password [email or web address] [facility]  [facility]  Office  [phone]  CC: Primary care physician; Carmon Ginsberg, [place]

## 2017-11-03 ENCOUNTER — Inpatient Hospital Stay: Admitting: Internal Medicine

## 2017-11-03 ENCOUNTER — Inpatient Hospital Stay

## 2017-11-03 DIAGNOSIS — F329 Major depressive disorder, single episode, unspecified: Secondary | ICD-10-CM | POA: Diagnosis not present

## 2017-11-03 DIAGNOSIS — C719 Malignant neoplasm of brain, unspecified: Secondary | ICD-10-CM | POA: Diagnosis not present

## 2017-11-03 DIAGNOSIS — R630 Anorexia: Secondary | ICD-10-CM | POA: Diagnosis not present

## 2017-11-03 DIAGNOSIS — E119 Type 2 diabetes mellitus without complications: Secondary | ICD-10-CM | POA: Diagnosis not present

## 2017-11-03 DIAGNOSIS — R634 Abnormal weight loss: Secondary | ICD-10-CM | POA: Diagnosis not present

## 2017-11-03 DIAGNOSIS — I10 Essential (primary) hypertension: Secondary | ICD-10-CM | POA: Diagnosis not present

## 2017-11-04 DIAGNOSIS — F329 Major depressive disorder, single episode, unspecified: Secondary | ICD-10-CM | POA: Diagnosis not present

## 2017-11-04 DIAGNOSIS — C719 Malignant neoplasm of brain, unspecified: Secondary | ICD-10-CM | POA: Diagnosis not present

## 2017-11-04 DIAGNOSIS — E119 Type 2 diabetes mellitus without complications: Secondary | ICD-10-CM | POA: Diagnosis not present

## 2017-11-04 DIAGNOSIS — I10 Essential (primary) hypertension: Secondary | ICD-10-CM | POA: Diagnosis not present

## 2017-11-04 DIAGNOSIS — R630 Anorexia: Secondary | ICD-10-CM | POA: Diagnosis not present

## 2017-11-04 DIAGNOSIS — R634 Abnormal weight loss: Secondary | ICD-10-CM | POA: Diagnosis not present

## 2017-11-04 LAB — CULTURE, BLOOD (ROUTINE X 2)
CULTURE: NO GROWTH
Culture: NO GROWTH
SPECIAL REQUESTS: ADEQUATE
Special Requests: ADEQUATE

## 2017-11-05 DIAGNOSIS — C719 Malignant neoplasm of brain, unspecified: Secondary | ICD-10-CM | POA: Diagnosis not present

## 2017-11-05 DIAGNOSIS — F329 Major depressive disorder, single episode, unspecified: Secondary | ICD-10-CM | POA: Diagnosis not present

## 2017-11-05 DIAGNOSIS — I10 Essential (primary) hypertension: Secondary | ICD-10-CM | POA: Diagnosis not present

## 2017-11-05 DIAGNOSIS — E119 Type 2 diabetes mellitus without complications: Secondary | ICD-10-CM | POA: Diagnosis not present

## 2017-11-05 DIAGNOSIS — R634 Abnormal weight loss: Secondary | ICD-10-CM | POA: Diagnosis not present

## 2017-11-05 DIAGNOSIS — R630 Anorexia: Secondary | ICD-10-CM | POA: Diagnosis not present

## 2017-11-08 DIAGNOSIS — E785 Hyperlipidemia, unspecified: Secondary | ICD-10-CM | POA: Diagnosis not present

## 2017-11-08 DIAGNOSIS — C719 Malignant neoplasm of brain, unspecified: Secondary | ICD-10-CM | POA: Diagnosis not present

## 2017-11-08 DIAGNOSIS — R634 Abnormal weight loss: Secondary | ICD-10-CM | POA: Diagnosis not present

## 2017-11-08 DIAGNOSIS — R630 Anorexia: Secondary | ICD-10-CM | POA: Diagnosis not present

## 2017-11-08 DIAGNOSIS — Z85850 Personal history of malignant neoplasm of thyroid: Secondary | ICD-10-CM | POA: Diagnosis not present

## 2017-11-08 DIAGNOSIS — I10 Essential (primary) hypertension: Secondary | ICD-10-CM | POA: Diagnosis not present

## 2017-11-08 DIAGNOSIS — E119 Type 2 diabetes mellitus without complications: Secondary | ICD-10-CM | POA: Diagnosis not present

## 2017-11-08 DIAGNOSIS — I8393 Asymptomatic varicose veins of bilateral lower extremities: Secondary | ICD-10-CM | POA: Diagnosis not present

## 2017-11-08 DIAGNOSIS — F329 Major depressive disorder, single episode, unspecified: Secondary | ICD-10-CM | POA: Diagnosis not present

## 2017-11-08 DIAGNOSIS — G252 Other specified forms of tremor: Secondary | ICD-10-CM | POA: Diagnosis not present

## 2017-11-08 DIAGNOSIS — E2740 Unspecified adrenocortical insufficiency: Secondary | ICD-10-CM | POA: Diagnosis not present

## 2017-11-08 DIAGNOSIS — Z7984 Long term (current) use of oral hypoglycemic drugs: Secondary | ICD-10-CM | POA: Diagnosis not present

## 2017-11-09 DIAGNOSIS — C719 Malignant neoplasm of brain, unspecified: Secondary | ICD-10-CM | POA: Diagnosis not present

## 2017-11-09 DIAGNOSIS — R630 Anorexia: Secondary | ICD-10-CM | POA: Diagnosis not present

## 2017-11-09 DIAGNOSIS — F329 Major depressive disorder, single episode, unspecified: Secondary | ICD-10-CM | POA: Diagnosis not present

## 2017-11-09 DIAGNOSIS — E119 Type 2 diabetes mellitus without complications: Secondary | ICD-10-CM | POA: Diagnosis not present

## 2017-11-09 DIAGNOSIS — I10 Essential (primary) hypertension: Secondary | ICD-10-CM | POA: Diagnosis not present

## 2017-11-09 DIAGNOSIS — R634 Abnormal weight loss: Secondary | ICD-10-CM | POA: Diagnosis not present

## 2017-11-10 DIAGNOSIS — R630 Anorexia: Secondary | ICD-10-CM | POA: Diagnosis not present

## 2017-11-10 DIAGNOSIS — E119 Type 2 diabetes mellitus without complications: Secondary | ICD-10-CM | POA: Diagnosis not present

## 2017-11-10 DIAGNOSIS — I10 Essential (primary) hypertension: Secondary | ICD-10-CM | POA: Diagnosis not present

## 2017-11-10 DIAGNOSIS — F329 Major depressive disorder, single episode, unspecified: Secondary | ICD-10-CM | POA: Diagnosis not present

## 2017-11-10 DIAGNOSIS — R634 Abnormal weight loss: Secondary | ICD-10-CM | POA: Diagnosis not present

## 2017-11-10 DIAGNOSIS — C719 Malignant neoplasm of brain, unspecified: Secondary | ICD-10-CM | POA: Diagnosis not present

## 2017-11-11 DIAGNOSIS — R634 Abnormal weight loss: Secondary | ICD-10-CM | POA: Diagnosis not present

## 2017-11-11 DIAGNOSIS — C719 Malignant neoplasm of brain, unspecified: Secondary | ICD-10-CM | POA: Diagnosis not present

## 2017-11-11 DIAGNOSIS — R630 Anorexia: Secondary | ICD-10-CM | POA: Diagnosis not present

## 2017-11-11 DIAGNOSIS — I10 Essential (primary) hypertension: Secondary | ICD-10-CM | POA: Diagnosis not present

## 2017-11-11 DIAGNOSIS — E119 Type 2 diabetes mellitus without complications: Secondary | ICD-10-CM | POA: Diagnosis not present

## 2017-11-11 DIAGNOSIS — F329 Major depressive disorder, single episode, unspecified: Secondary | ICD-10-CM | POA: Diagnosis not present

## 2017-11-12 DIAGNOSIS — F329 Major depressive disorder, single episode, unspecified: Secondary | ICD-10-CM | POA: Diagnosis not present

## 2017-11-12 DIAGNOSIS — C719 Malignant neoplasm of brain, unspecified: Secondary | ICD-10-CM | POA: Diagnosis not present

## 2017-11-12 DIAGNOSIS — R630 Anorexia: Secondary | ICD-10-CM | POA: Diagnosis not present

## 2017-11-12 DIAGNOSIS — I10 Essential (primary) hypertension: Secondary | ICD-10-CM | POA: Diagnosis not present

## 2017-11-12 DIAGNOSIS — R634 Abnormal weight loss: Secondary | ICD-10-CM | POA: Diagnosis not present

## 2017-11-12 DIAGNOSIS — E119 Type 2 diabetes mellitus without complications: Secondary | ICD-10-CM | POA: Diagnosis not present

## 2017-11-16 DIAGNOSIS — F329 Major depressive disorder, single episode, unspecified: Secondary | ICD-10-CM | POA: Diagnosis not present

## 2017-11-16 DIAGNOSIS — R634 Abnormal weight loss: Secondary | ICD-10-CM | POA: Diagnosis not present

## 2017-11-16 DIAGNOSIS — I10 Essential (primary) hypertension: Secondary | ICD-10-CM | POA: Diagnosis not present

## 2017-11-16 DIAGNOSIS — R630 Anorexia: Secondary | ICD-10-CM | POA: Diagnosis not present

## 2017-11-16 DIAGNOSIS — C719 Malignant neoplasm of brain, unspecified: Secondary | ICD-10-CM | POA: Diagnosis not present

## 2017-11-16 DIAGNOSIS — E119 Type 2 diabetes mellitus without complications: Secondary | ICD-10-CM | POA: Diagnosis not present

## 2017-11-17 ENCOUNTER — Other Ambulatory Visit: Payer: Self-pay | Admitting: Family Medicine

## 2017-11-17 DIAGNOSIS — I10 Essential (primary) hypertension: Secondary | ICD-10-CM | POA: Diagnosis not present

## 2017-11-17 DIAGNOSIS — C719 Malignant neoplasm of brain, unspecified: Secondary | ICD-10-CM | POA: Diagnosis not present

## 2017-11-17 DIAGNOSIS — R630 Anorexia: Secondary | ICD-10-CM | POA: Diagnosis not present

## 2017-11-17 DIAGNOSIS — E119 Type 2 diabetes mellitus without complications: Secondary | ICD-10-CM | POA: Diagnosis not present

## 2017-11-17 DIAGNOSIS — E1159 Type 2 diabetes mellitus with other circulatory complications: Secondary | ICD-10-CM

## 2017-11-17 DIAGNOSIS — R634 Abnormal weight loss: Secondary | ICD-10-CM | POA: Diagnosis not present

## 2017-11-17 DIAGNOSIS — F329 Major depressive disorder, single episode, unspecified: Secondary | ICD-10-CM | POA: Diagnosis not present

## 2017-11-18 DIAGNOSIS — E119 Type 2 diabetes mellitus without complications: Secondary | ICD-10-CM | POA: Diagnosis not present

## 2017-11-18 DIAGNOSIS — R634 Abnormal weight loss: Secondary | ICD-10-CM | POA: Diagnosis not present

## 2017-11-18 DIAGNOSIS — F329 Major depressive disorder, single episode, unspecified: Secondary | ICD-10-CM | POA: Diagnosis not present

## 2017-11-18 DIAGNOSIS — I10 Essential (primary) hypertension: Secondary | ICD-10-CM | POA: Diagnosis not present

## 2017-11-18 DIAGNOSIS — R630 Anorexia: Secondary | ICD-10-CM | POA: Diagnosis not present

## 2017-11-18 DIAGNOSIS — C719 Malignant neoplasm of brain, unspecified: Secondary | ICD-10-CM | POA: Diagnosis not present

## 2017-11-19 DIAGNOSIS — F329 Major depressive disorder, single episode, unspecified: Secondary | ICD-10-CM | POA: Diagnosis not present

## 2017-11-19 DIAGNOSIS — I10 Essential (primary) hypertension: Secondary | ICD-10-CM | POA: Diagnosis not present

## 2017-11-19 DIAGNOSIS — E119 Type 2 diabetes mellitus without complications: Secondary | ICD-10-CM | POA: Diagnosis not present

## 2017-11-19 DIAGNOSIS — R630 Anorexia: Secondary | ICD-10-CM | POA: Diagnosis not present

## 2017-11-19 DIAGNOSIS — C719 Malignant neoplasm of brain, unspecified: Secondary | ICD-10-CM | POA: Diagnosis not present

## 2017-11-19 DIAGNOSIS — R634 Abnormal weight loss: Secondary | ICD-10-CM | POA: Diagnosis not present

## 2017-11-22 DIAGNOSIS — F329 Major depressive disorder, single episode, unspecified: Secondary | ICD-10-CM | POA: Diagnosis not present

## 2017-11-22 DIAGNOSIS — R630 Anorexia: Secondary | ICD-10-CM | POA: Diagnosis not present

## 2017-11-22 DIAGNOSIS — C719 Malignant neoplasm of brain, unspecified: Secondary | ICD-10-CM | POA: Diagnosis not present

## 2017-11-22 DIAGNOSIS — R634 Abnormal weight loss: Secondary | ICD-10-CM | POA: Diagnosis not present

## 2017-11-22 DIAGNOSIS — I10 Essential (primary) hypertension: Secondary | ICD-10-CM | POA: Diagnosis not present

## 2017-11-22 DIAGNOSIS — E119 Type 2 diabetes mellitus without complications: Secondary | ICD-10-CM | POA: Diagnosis not present

## 2017-11-23 DIAGNOSIS — R634 Abnormal weight loss: Secondary | ICD-10-CM | POA: Diagnosis not present

## 2017-11-23 DIAGNOSIS — R630 Anorexia: Secondary | ICD-10-CM | POA: Diagnosis not present

## 2017-11-23 DIAGNOSIS — E119 Type 2 diabetes mellitus without complications: Secondary | ICD-10-CM | POA: Diagnosis not present

## 2017-11-23 DIAGNOSIS — C719 Malignant neoplasm of brain, unspecified: Secondary | ICD-10-CM | POA: Diagnosis not present

## 2017-11-23 DIAGNOSIS — F329 Major depressive disorder, single episode, unspecified: Secondary | ICD-10-CM | POA: Diagnosis not present

## 2017-11-23 DIAGNOSIS — I10 Essential (primary) hypertension: Secondary | ICD-10-CM | POA: Diagnosis not present

## 2017-11-24 DIAGNOSIS — C719 Malignant neoplasm of brain, unspecified: Secondary | ICD-10-CM | POA: Diagnosis not present

## 2017-11-24 DIAGNOSIS — F329 Major depressive disorder, single episode, unspecified: Secondary | ICD-10-CM | POA: Diagnosis not present

## 2017-11-24 DIAGNOSIS — R630 Anorexia: Secondary | ICD-10-CM | POA: Diagnosis not present

## 2017-11-24 DIAGNOSIS — E119 Type 2 diabetes mellitus without complications: Secondary | ICD-10-CM | POA: Diagnosis not present

## 2017-11-24 DIAGNOSIS — I10 Essential (primary) hypertension: Secondary | ICD-10-CM | POA: Diagnosis not present

## 2017-11-24 DIAGNOSIS — R634 Abnormal weight loss: Secondary | ICD-10-CM | POA: Diagnosis not present

## 2017-11-25 DIAGNOSIS — C719 Malignant neoplasm of brain, unspecified: Secondary | ICD-10-CM | POA: Diagnosis not present

## 2017-11-25 DIAGNOSIS — R630 Anorexia: Secondary | ICD-10-CM | POA: Diagnosis not present

## 2017-11-25 DIAGNOSIS — R634 Abnormal weight loss: Secondary | ICD-10-CM | POA: Diagnosis not present

## 2017-11-25 DIAGNOSIS — I10 Essential (primary) hypertension: Secondary | ICD-10-CM | POA: Diagnosis not present

## 2017-11-25 DIAGNOSIS — E119 Type 2 diabetes mellitus without complications: Secondary | ICD-10-CM | POA: Diagnosis not present

## 2017-11-25 DIAGNOSIS — F329 Major depressive disorder, single episode, unspecified: Secondary | ICD-10-CM | POA: Diagnosis not present

## 2017-11-26 DIAGNOSIS — E119 Type 2 diabetes mellitus without complications: Secondary | ICD-10-CM | POA: Diagnosis not present

## 2017-11-26 DIAGNOSIS — R630 Anorexia: Secondary | ICD-10-CM | POA: Diagnosis not present

## 2017-11-26 DIAGNOSIS — R634 Abnormal weight loss: Secondary | ICD-10-CM | POA: Diagnosis not present

## 2017-11-26 DIAGNOSIS — F329 Major depressive disorder, single episode, unspecified: Secondary | ICD-10-CM | POA: Diagnosis not present

## 2017-11-26 DIAGNOSIS — C719 Malignant neoplasm of brain, unspecified: Secondary | ICD-10-CM | POA: Diagnosis not present

## 2017-11-26 DIAGNOSIS — I10 Essential (primary) hypertension: Secondary | ICD-10-CM | POA: Diagnosis not present

## 2017-11-28 ENCOUNTER — Other Ambulatory Visit: Payer: Self-pay | Admitting: Family Medicine

## 2017-11-28 ENCOUNTER — Other Ambulatory Visit: Payer: Self-pay | Admitting: Internal Medicine

## 2017-11-28 DIAGNOSIS — I10 Essential (primary) hypertension: Secondary | ICD-10-CM

## 2017-11-29 DIAGNOSIS — R634 Abnormal weight loss: Secondary | ICD-10-CM | POA: Diagnosis not present

## 2017-11-29 DIAGNOSIS — C719 Malignant neoplasm of brain, unspecified: Secondary | ICD-10-CM | POA: Diagnosis not present

## 2017-11-29 DIAGNOSIS — R630 Anorexia: Secondary | ICD-10-CM | POA: Diagnosis not present

## 2017-11-29 DIAGNOSIS — E119 Type 2 diabetes mellitus without complications: Secondary | ICD-10-CM | POA: Diagnosis not present

## 2017-11-29 DIAGNOSIS — F329 Major depressive disorder, single episode, unspecified: Secondary | ICD-10-CM | POA: Diagnosis not present

## 2017-11-29 DIAGNOSIS — I10 Essential (primary) hypertension: Secondary | ICD-10-CM | POA: Diagnosis not present

## 2017-11-30 DIAGNOSIS — C719 Malignant neoplasm of brain, unspecified: Secondary | ICD-10-CM | POA: Diagnosis not present

## 2017-11-30 DIAGNOSIS — I10 Essential (primary) hypertension: Secondary | ICD-10-CM | POA: Diagnosis not present

## 2017-11-30 DIAGNOSIS — F329 Major depressive disorder, single episode, unspecified: Secondary | ICD-10-CM | POA: Diagnosis not present

## 2017-11-30 DIAGNOSIS — E119 Type 2 diabetes mellitus without complications: Secondary | ICD-10-CM | POA: Diagnosis not present

## 2017-11-30 DIAGNOSIS — R630 Anorexia: Secondary | ICD-10-CM | POA: Diagnosis not present

## 2017-11-30 DIAGNOSIS — R634 Abnormal weight loss: Secondary | ICD-10-CM | POA: Diagnosis not present

## 2017-12-01 DIAGNOSIS — F329 Major depressive disorder, single episode, unspecified: Secondary | ICD-10-CM | POA: Diagnosis not present

## 2017-12-01 DIAGNOSIS — C719 Malignant neoplasm of brain, unspecified: Secondary | ICD-10-CM | POA: Diagnosis not present

## 2017-12-01 DIAGNOSIS — R630 Anorexia: Secondary | ICD-10-CM | POA: Diagnosis not present

## 2017-12-01 DIAGNOSIS — R634 Abnormal weight loss: Secondary | ICD-10-CM | POA: Diagnosis not present

## 2017-12-01 DIAGNOSIS — E119 Type 2 diabetes mellitus without complications: Secondary | ICD-10-CM | POA: Diagnosis not present

## 2017-12-01 DIAGNOSIS — I10 Essential (primary) hypertension: Secondary | ICD-10-CM | POA: Diagnosis not present

## 2017-12-02 DIAGNOSIS — E119 Type 2 diabetes mellitus without complications: Secondary | ICD-10-CM | POA: Diagnosis not present

## 2017-12-02 DIAGNOSIS — R630 Anorexia: Secondary | ICD-10-CM | POA: Diagnosis not present

## 2017-12-02 DIAGNOSIS — F329 Major depressive disorder, single episode, unspecified: Secondary | ICD-10-CM | POA: Diagnosis not present

## 2017-12-02 DIAGNOSIS — I10 Essential (primary) hypertension: Secondary | ICD-10-CM | POA: Diagnosis not present

## 2017-12-02 DIAGNOSIS — R634 Abnormal weight loss: Secondary | ICD-10-CM | POA: Diagnosis not present

## 2017-12-02 DIAGNOSIS — C719 Malignant neoplasm of brain, unspecified: Secondary | ICD-10-CM | POA: Diagnosis not present

## 2017-12-03 DIAGNOSIS — R630 Anorexia: Secondary | ICD-10-CM | POA: Diagnosis not present

## 2017-12-03 DIAGNOSIS — C719 Malignant neoplasm of brain, unspecified: Secondary | ICD-10-CM | POA: Diagnosis not present

## 2017-12-03 DIAGNOSIS — R634 Abnormal weight loss: Secondary | ICD-10-CM | POA: Diagnosis not present

## 2017-12-03 DIAGNOSIS — I10 Essential (primary) hypertension: Secondary | ICD-10-CM | POA: Diagnosis not present

## 2017-12-03 DIAGNOSIS — E119 Type 2 diabetes mellitus without complications: Secondary | ICD-10-CM | POA: Diagnosis not present

## 2017-12-03 DIAGNOSIS — F329 Major depressive disorder, single episode, unspecified: Secondary | ICD-10-CM | POA: Diagnosis not present

## 2017-12-06 DIAGNOSIS — E119 Type 2 diabetes mellitus without complications: Secondary | ICD-10-CM | POA: Diagnosis not present

## 2017-12-06 DIAGNOSIS — R630 Anorexia: Secondary | ICD-10-CM | POA: Diagnosis not present

## 2017-12-06 DIAGNOSIS — I10 Essential (primary) hypertension: Secondary | ICD-10-CM | POA: Diagnosis not present

## 2017-12-06 DIAGNOSIS — R634 Abnormal weight loss: Secondary | ICD-10-CM | POA: Diagnosis not present

## 2017-12-06 DIAGNOSIS — C719 Malignant neoplasm of brain, unspecified: Secondary | ICD-10-CM | POA: Diagnosis not present

## 2017-12-06 DIAGNOSIS — F329 Major depressive disorder, single episode, unspecified: Secondary | ICD-10-CM | POA: Diagnosis not present

## 2017-12-07 DIAGNOSIS — F329 Major depressive disorder, single episode, unspecified: Secondary | ICD-10-CM | POA: Diagnosis not present

## 2017-12-07 DIAGNOSIS — E119 Type 2 diabetes mellitus without complications: Secondary | ICD-10-CM | POA: Diagnosis not present

## 2017-12-07 DIAGNOSIS — I10 Essential (primary) hypertension: Secondary | ICD-10-CM | POA: Diagnosis not present

## 2017-12-07 DIAGNOSIS — R630 Anorexia: Secondary | ICD-10-CM | POA: Diagnosis not present

## 2017-12-07 DIAGNOSIS — C719 Malignant neoplasm of brain, unspecified: Secondary | ICD-10-CM | POA: Diagnosis not present

## 2017-12-07 DIAGNOSIS — R634 Abnormal weight loss: Secondary | ICD-10-CM | POA: Diagnosis not present

## 2017-12-08 DIAGNOSIS — Z85850 Personal history of malignant neoplasm of thyroid: Secondary | ICD-10-CM | POA: Diagnosis not present

## 2017-12-08 DIAGNOSIS — I10 Essential (primary) hypertension: Secondary | ICD-10-CM | POA: Diagnosis not present

## 2017-12-08 DIAGNOSIS — F329 Major depressive disorder, single episode, unspecified: Secondary | ICD-10-CM | POA: Diagnosis not present

## 2017-12-08 DIAGNOSIS — E2740 Unspecified adrenocortical insufficiency: Secondary | ICD-10-CM | POA: Diagnosis not present

## 2017-12-08 DIAGNOSIS — Z9981 Dependence on supplemental oxygen: Secondary | ICD-10-CM | POA: Diagnosis not present

## 2017-12-08 DIAGNOSIS — I8393 Asymptomatic varicose veins of bilateral lower extremities: Secondary | ICD-10-CM | POA: Diagnosis not present

## 2017-12-08 DIAGNOSIS — Z7984 Long term (current) use of oral hypoglycemic drugs: Secondary | ICD-10-CM | POA: Diagnosis not present

## 2017-12-08 DIAGNOSIS — E1142 Type 2 diabetes mellitus with diabetic polyneuropathy: Secondary | ICD-10-CM | POA: Diagnosis not present

## 2017-12-08 DIAGNOSIS — C719 Malignant neoplasm of brain, unspecified: Secondary | ICD-10-CM | POA: Diagnosis not present

## 2017-12-08 DIAGNOSIS — E785 Hyperlipidemia, unspecified: Secondary | ICD-10-CM | POA: Diagnosis not present

## 2017-12-08 DIAGNOSIS — G252 Other specified forms of tremor: Secondary | ICD-10-CM | POA: Diagnosis not present

## 2017-12-09 DIAGNOSIS — C719 Malignant neoplasm of brain, unspecified: Secondary | ICD-10-CM | POA: Diagnosis not present

## 2017-12-09 DIAGNOSIS — Z9981 Dependence on supplemental oxygen: Secondary | ICD-10-CM | POA: Diagnosis not present

## 2017-12-09 DIAGNOSIS — M25572 Pain in left ankle and joints of left foot: Secondary | ICD-10-CM | POA: Diagnosis not present

## 2017-12-09 DIAGNOSIS — Z7984 Long term (current) use of oral hypoglycemic drugs: Secondary | ICD-10-CM | POA: Diagnosis not present

## 2017-12-09 DIAGNOSIS — I10 Essential (primary) hypertension: Secondary | ICD-10-CM | POA: Diagnosis not present

## 2017-12-09 DIAGNOSIS — E1142 Type 2 diabetes mellitus with diabetic polyneuropathy: Secondary | ICD-10-CM | POA: Diagnosis not present

## 2017-12-09 DIAGNOSIS — F329 Major depressive disorder, single episode, unspecified: Secondary | ICD-10-CM | POA: Diagnosis not present

## 2017-12-10 DIAGNOSIS — Z7984 Long term (current) use of oral hypoglycemic drugs: Secondary | ICD-10-CM | POA: Diagnosis not present

## 2017-12-10 DIAGNOSIS — F329 Major depressive disorder, single episode, unspecified: Secondary | ICD-10-CM | POA: Diagnosis not present

## 2017-12-10 DIAGNOSIS — C719 Malignant neoplasm of brain, unspecified: Secondary | ICD-10-CM | POA: Diagnosis not present

## 2017-12-10 DIAGNOSIS — Z9981 Dependence on supplemental oxygen: Secondary | ICD-10-CM | POA: Diagnosis not present

## 2017-12-10 DIAGNOSIS — I10 Essential (primary) hypertension: Secondary | ICD-10-CM | POA: Diagnosis not present

## 2017-12-10 DIAGNOSIS — E1142 Type 2 diabetes mellitus with diabetic polyneuropathy: Secondary | ICD-10-CM | POA: Diagnosis not present

## 2017-12-13 DIAGNOSIS — I10 Essential (primary) hypertension: Secondary | ICD-10-CM | POA: Diagnosis not present

## 2017-12-13 DIAGNOSIS — E1142 Type 2 diabetes mellitus with diabetic polyneuropathy: Secondary | ICD-10-CM | POA: Diagnosis not present

## 2017-12-13 DIAGNOSIS — Z7984 Long term (current) use of oral hypoglycemic drugs: Secondary | ICD-10-CM | POA: Diagnosis not present

## 2017-12-13 DIAGNOSIS — Z9981 Dependence on supplemental oxygen: Secondary | ICD-10-CM | POA: Diagnosis not present

## 2017-12-13 DIAGNOSIS — C719 Malignant neoplasm of brain, unspecified: Secondary | ICD-10-CM | POA: Diagnosis not present

## 2017-12-13 DIAGNOSIS — F329 Major depressive disorder, single episode, unspecified: Secondary | ICD-10-CM | POA: Diagnosis not present

## 2017-12-14 DIAGNOSIS — Z7984 Long term (current) use of oral hypoglycemic drugs: Secondary | ICD-10-CM | POA: Diagnosis not present

## 2017-12-14 DIAGNOSIS — Z9981 Dependence on supplemental oxygen: Secondary | ICD-10-CM | POA: Diagnosis not present

## 2017-12-14 DIAGNOSIS — E1142 Type 2 diabetes mellitus with diabetic polyneuropathy: Secondary | ICD-10-CM | POA: Diagnosis not present

## 2017-12-14 DIAGNOSIS — F329 Major depressive disorder, single episode, unspecified: Secondary | ICD-10-CM | POA: Diagnosis not present

## 2017-12-14 DIAGNOSIS — I10 Essential (primary) hypertension: Secondary | ICD-10-CM | POA: Diagnosis not present

## 2017-12-14 DIAGNOSIS — C719 Malignant neoplasm of brain, unspecified: Secondary | ICD-10-CM | POA: Diagnosis not present

## 2017-12-15 DIAGNOSIS — E1142 Type 2 diabetes mellitus with diabetic polyneuropathy: Secondary | ICD-10-CM | POA: Diagnosis not present

## 2017-12-15 DIAGNOSIS — Z7984 Long term (current) use of oral hypoglycemic drugs: Secondary | ICD-10-CM | POA: Diagnosis not present

## 2017-12-15 DIAGNOSIS — Z9981 Dependence on supplemental oxygen: Secondary | ICD-10-CM | POA: Diagnosis not present

## 2017-12-15 DIAGNOSIS — I10 Essential (primary) hypertension: Secondary | ICD-10-CM | POA: Diagnosis not present

## 2017-12-15 DIAGNOSIS — C719 Malignant neoplasm of brain, unspecified: Secondary | ICD-10-CM | POA: Diagnosis not present

## 2017-12-15 DIAGNOSIS — F329 Major depressive disorder, single episode, unspecified: Secondary | ICD-10-CM | POA: Diagnosis not present

## 2017-12-16 DIAGNOSIS — E1142 Type 2 diabetes mellitus with diabetic polyneuropathy: Secondary | ICD-10-CM | POA: Diagnosis not present

## 2017-12-16 DIAGNOSIS — N390 Urinary tract infection, site not specified: Secondary | ICD-10-CM | POA: Diagnosis not present

## 2017-12-16 DIAGNOSIS — M25572 Pain in left ankle and joints of left foot: Secondary | ICD-10-CM | POA: Diagnosis not present

## 2017-12-16 DIAGNOSIS — F329 Major depressive disorder, single episode, unspecified: Secondary | ICD-10-CM | POA: Diagnosis not present

## 2017-12-16 DIAGNOSIS — Z7984 Long term (current) use of oral hypoglycemic drugs: Secondary | ICD-10-CM | POA: Diagnosis not present

## 2017-12-16 DIAGNOSIS — I10 Essential (primary) hypertension: Secondary | ICD-10-CM | POA: Diagnosis not present

## 2017-12-16 DIAGNOSIS — C719 Malignant neoplasm of brain, unspecified: Secondary | ICD-10-CM | POA: Diagnosis not present

## 2017-12-16 DIAGNOSIS — Z9981 Dependence on supplemental oxygen: Secondary | ICD-10-CM | POA: Diagnosis not present

## 2017-12-17 DIAGNOSIS — F329 Major depressive disorder, single episode, unspecified: Secondary | ICD-10-CM | POA: Diagnosis not present

## 2017-12-17 DIAGNOSIS — E1142 Type 2 diabetes mellitus with diabetic polyneuropathy: Secondary | ICD-10-CM | POA: Diagnosis not present

## 2017-12-17 DIAGNOSIS — C719 Malignant neoplasm of brain, unspecified: Secondary | ICD-10-CM | POA: Diagnosis not present

## 2017-12-17 DIAGNOSIS — Z7984 Long term (current) use of oral hypoglycemic drugs: Secondary | ICD-10-CM | POA: Diagnosis not present

## 2017-12-17 DIAGNOSIS — I10 Essential (primary) hypertension: Secondary | ICD-10-CM | POA: Diagnosis not present

## 2017-12-17 DIAGNOSIS — Z9981 Dependence on supplemental oxygen: Secondary | ICD-10-CM | POA: Diagnosis not present

## 2017-12-20 DIAGNOSIS — E1142 Type 2 diabetes mellitus with diabetic polyneuropathy: Secondary | ICD-10-CM | POA: Diagnosis not present

## 2017-12-20 DIAGNOSIS — Z9981 Dependence on supplemental oxygen: Secondary | ICD-10-CM | POA: Diagnosis not present

## 2017-12-20 DIAGNOSIS — C719 Malignant neoplasm of brain, unspecified: Secondary | ICD-10-CM | POA: Diagnosis not present

## 2017-12-20 DIAGNOSIS — Z7984 Long term (current) use of oral hypoglycemic drugs: Secondary | ICD-10-CM | POA: Diagnosis not present

## 2017-12-20 DIAGNOSIS — I10 Essential (primary) hypertension: Secondary | ICD-10-CM | POA: Diagnosis not present

## 2017-12-20 DIAGNOSIS — F329 Major depressive disorder, single episode, unspecified: Secondary | ICD-10-CM | POA: Diagnosis not present

## 2017-12-21 ENCOUNTER — Ambulatory Visit: Payer: Medicare Other | Admitting: Podiatry

## 2017-12-21 DIAGNOSIS — E1142 Type 2 diabetes mellitus with diabetic polyneuropathy: Secondary | ICD-10-CM | POA: Diagnosis not present

## 2017-12-21 DIAGNOSIS — Z9981 Dependence on supplemental oxygen: Secondary | ICD-10-CM | POA: Diagnosis not present

## 2017-12-21 DIAGNOSIS — C719 Malignant neoplasm of brain, unspecified: Secondary | ICD-10-CM | POA: Diagnosis not present

## 2017-12-21 DIAGNOSIS — Z7984 Long term (current) use of oral hypoglycemic drugs: Secondary | ICD-10-CM | POA: Diagnosis not present

## 2017-12-21 DIAGNOSIS — I10 Essential (primary) hypertension: Secondary | ICD-10-CM | POA: Diagnosis not present

## 2017-12-21 DIAGNOSIS — F329 Major depressive disorder, single episode, unspecified: Secondary | ICD-10-CM | POA: Diagnosis not present

## 2017-12-22 DIAGNOSIS — I10 Essential (primary) hypertension: Secondary | ICD-10-CM | POA: Diagnosis not present

## 2017-12-22 DIAGNOSIS — C719 Malignant neoplasm of brain, unspecified: Secondary | ICD-10-CM | POA: Diagnosis not present

## 2017-12-22 DIAGNOSIS — Z9981 Dependence on supplemental oxygen: Secondary | ICD-10-CM | POA: Diagnosis not present

## 2017-12-22 DIAGNOSIS — Z7984 Long term (current) use of oral hypoglycemic drugs: Secondary | ICD-10-CM | POA: Diagnosis not present

## 2017-12-22 DIAGNOSIS — F329 Major depressive disorder, single episode, unspecified: Secondary | ICD-10-CM | POA: Diagnosis not present

## 2017-12-22 DIAGNOSIS — E1142 Type 2 diabetes mellitus with diabetic polyneuropathy: Secondary | ICD-10-CM | POA: Diagnosis not present

## 2017-12-23 DIAGNOSIS — F329 Major depressive disorder, single episode, unspecified: Secondary | ICD-10-CM | POA: Diagnosis not present

## 2017-12-23 DIAGNOSIS — C719 Malignant neoplasm of brain, unspecified: Secondary | ICD-10-CM | POA: Diagnosis not present

## 2017-12-23 DIAGNOSIS — Z7984 Long term (current) use of oral hypoglycemic drugs: Secondary | ICD-10-CM | POA: Diagnosis not present

## 2017-12-23 DIAGNOSIS — Z9981 Dependence on supplemental oxygen: Secondary | ICD-10-CM | POA: Diagnosis not present

## 2017-12-23 DIAGNOSIS — I10 Essential (primary) hypertension: Secondary | ICD-10-CM | POA: Diagnosis not present

## 2017-12-23 DIAGNOSIS — E1142 Type 2 diabetes mellitus with diabetic polyneuropathy: Secondary | ICD-10-CM | POA: Diagnosis not present

## 2017-12-24 DIAGNOSIS — F329 Major depressive disorder, single episode, unspecified: Secondary | ICD-10-CM | POA: Diagnosis not present

## 2017-12-24 DIAGNOSIS — C719 Malignant neoplasm of brain, unspecified: Secondary | ICD-10-CM | POA: Diagnosis not present

## 2017-12-24 DIAGNOSIS — Z9981 Dependence on supplemental oxygen: Secondary | ICD-10-CM | POA: Diagnosis not present

## 2017-12-24 DIAGNOSIS — I10 Essential (primary) hypertension: Secondary | ICD-10-CM | POA: Diagnosis not present

## 2017-12-24 DIAGNOSIS — E1142 Type 2 diabetes mellitus with diabetic polyneuropathy: Secondary | ICD-10-CM | POA: Diagnosis not present

## 2017-12-24 DIAGNOSIS — Z7984 Long term (current) use of oral hypoglycemic drugs: Secondary | ICD-10-CM | POA: Diagnosis not present

## 2017-12-27 DIAGNOSIS — I10 Essential (primary) hypertension: Secondary | ICD-10-CM | POA: Diagnosis not present

## 2017-12-27 DIAGNOSIS — Z7984 Long term (current) use of oral hypoglycemic drugs: Secondary | ICD-10-CM | POA: Diagnosis not present

## 2017-12-27 DIAGNOSIS — E1142 Type 2 diabetes mellitus with diabetic polyneuropathy: Secondary | ICD-10-CM | POA: Diagnosis not present

## 2017-12-27 DIAGNOSIS — C719 Malignant neoplasm of brain, unspecified: Secondary | ICD-10-CM | POA: Diagnosis not present

## 2017-12-27 DIAGNOSIS — F329 Major depressive disorder, single episode, unspecified: Secondary | ICD-10-CM | POA: Diagnosis not present

## 2017-12-27 DIAGNOSIS — Z9981 Dependence on supplemental oxygen: Secondary | ICD-10-CM | POA: Diagnosis not present

## 2017-12-28 ENCOUNTER — Other Ambulatory Visit: Payer: Self-pay | Admitting: Internal Medicine

## 2017-12-28 DIAGNOSIS — E1142 Type 2 diabetes mellitus with diabetic polyneuropathy: Secondary | ICD-10-CM | POA: Diagnosis not present

## 2017-12-28 DIAGNOSIS — Z7984 Long term (current) use of oral hypoglycemic drugs: Secondary | ICD-10-CM | POA: Diagnosis not present

## 2017-12-28 DIAGNOSIS — I10 Essential (primary) hypertension: Secondary | ICD-10-CM | POA: Diagnosis not present

## 2017-12-28 DIAGNOSIS — C719 Malignant neoplasm of brain, unspecified: Secondary | ICD-10-CM | POA: Diagnosis not present

## 2017-12-28 DIAGNOSIS — E890 Postprocedural hypothyroidism: Secondary | ICD-10-CM

## 2017-12-28 DIAGNOSIS — Z9981 Dependence on supplemental oxygen: Secondary | ICD-10-CM | POA: Diagnosis not present

## 2017-12-28 DIAGNOSIS — F329 Major depressive disorder, single episode, unspecified: Secondary | ICD-10-CM | POA: Diagnosis not present

## 2017-12-28 NOTE — Telephone Encounter (Signed)
Last TSH was in October   Dx:  Glioblastoma determined by biopsy of ...   Ref Range & Units 20mo ago  TSH 0.350 - 4.500 uIU/mL 1.211

## 2017-12-28 NOTE — Telephone Encounter (Signed)
Per Dr. Rogue Bussing - patient is under hospice services at the [facility]. At this time, he will renew the levothyroxine 100 mcg at this time. Will send RF to pharmacy.

## 2017-12-29 DIAGNOSIS — F329 Major depressive disorder, single episode, unspecified: Secondary | ICD-10-CM | POA: Diagnosis not present

## 2017-12-29 DIAGNOSIS — E1142 Type 2 diabetes mellitus with diabetic polyneuropathy: Secondary | ICD-10-CM | POA: Diagnosis not present

## 2017-12-29 DIAGNOSIS — Z7984 Long term (current) use of oral hypoglycemic drugs: Secondary | ICD-10-CM | POA: Diagnosis not present

## 2017-12-29 DIAGNOSIS — Z9981 Dependence on supplemental oxygen: Secondary | ICD-10-CM | POA: Diagnosis not present

## 2017-12-29 DIAGNOSIS — M25571 Pain in right ankle and joints of right foot: Secondary | ICD-10-CM | POA: Diagnosis not present

## 2017-12-29 DIAGNOSIS — M25561 Pain in right knee: Secondary | ICD-10-CM | POA: Diagnosis not present

## 2017-12-29 DIAGNOSIS — I10 Essential (primary) hypertension: Secondary | ICD-10-CM | POA: Diagnosis not present

## 2017-12-29 DIAGNOSIS — C719 Malignant neoplasm of brain, unspecified: Secondary | ICD-10-CM | POA: Diagnosis not present

## 2017-12-30 DIAGNOSIS — Z7984 Long term (current) use of oral hypoglycemic drugs: Secondary | ICD-10-CM | POA: Diagnosis not present

## 2017-12-30 DIAGNOSIS — Z9981 Dependence on supplemental oxygen: Secondary | ICD-10-CM | POA: Diagnosis not present

## 2017-12-30 DIAGNOSIS — I10 Essential (primary) hypertension: Secondary | ICD-10-CM | POA: Diagnosis not present

## 2017-12-30 DIAGNOSIS — E1142 Type 2 diabetes mellitus with diabetic polyneuropathy: Secondary | ICD-10-CM | POA: Diagnosis not present

## 2017-12-30 DIAGNOSIS — C719 Malignant neoplasm of brain, unspecified: Secondary | ICD-10-CM | POA: Diagnosis not present

## 2017-12-30 DIAGNOSIS — F329 Major depressive disorder, single episode, unspecified: Secondary | ICD-10-CM | POA: Diagnosis not present

## 2017-12-31 DIAGNOSIS — Z7984 Long term (current) use of oral hypoglycemic drugs: Secondary | ICD-10-CM | POA: Diagnosis not present

## 2017-12-31 DIAGNOSIS — E1142 Type 2 diabetes mellitus with diabetic polyneuropathy: Secondary | ICD-10-CM | POA: Diagnosis not present

## 2017-12-31 DIAGNOSIS — Z9981 Dependence on supplemental oxygen: Secondary | ICD-10-CM | POA: Diagnosis not present

## 2017-12-31 DIAGNOSIS — I10 Essential (primary) hypertension: Secondary | ICD-10-CM | POA: Diagnosis not present

## 2017-12-31 DIAGNOSIS — C719 Malignant neoplasm of brain, unspecified: Secondary | ICD-10-CM | POA: Diagnosis not present

## 2017-12-31 DIAGNOSIS — F329 Major depressive disorder, single episode, unspecified: Secondary | ICD-10-CM | POA: Diagnosis not present

## 2018-01-01 DIAGNOSIS — C719 Malignant neoplasm of brain, unspecified: Secondary | ICD-10-CM | POA: Diagnosis not present

## 2018-01-01 DIAGNOSIS — F329 Major depressive disorder, single episode, unspecified: Secondary | ICD-10-CM | POA: Diagnosis not present

## 2018-01-01 DIAGNOSIS — Z7984 Long term (current) use of oral hypoglycemic drugs: Secondary | ICD-10-CM | POA: Diagnosis not present

## 2018-01-01 DIAGNOSIS — Z9981 Dependence on supplemental oxygen: Secondary | ICD-10-CM | POA: Diagnosis not present

## 2018-01-01 DIAGNOSIS — E1142 Type 2 diabetes mellitus with diabetic polyneuropathy: Secondary | ICD-10-CM | POA: Diagnosis not present

## 2018-01-01 DIAGNOSIS — I10 Essential (primary) hypertension: Secondary | ICD-10-CM | POA: Diagnosis not present

## 2018-01-02 DIAGNOSIS — C719 Malignant neoplasm of brain, unspecified: Secondary | ICD-10-CM | POA: Diagnosis not present

## 2018-01-02 DIAGNOSIS — Z9981 Dependence on supplemental oxygen: Secondary | ICD-10-CM | POA: Diagnosis not present

## 2018-01-02 DIAGNOSIS — F329 Major depressive disorder, single episode, unspecified: Secondary | ICD-10-CM | POA: Diagnosis not present

## 2018-01-02 DIAGNOSIS — E1142 Type 2 diabetes mellitus with diabetic polyneuropathy: Secondary | ICD-10-CM | POA: Diagnosis not present

## 2018-01-02 DIAGNOSIS — I10 Essential (primary) hypertension: Secondary | ICD-10-CM | POA: Diagnosis not present

## 2018-01-02 DIAGNOSIS — Z7984 Long term (current) use of oral hypoglycemic drugs: Secondary | ICD-10-CM | POA: Diagnosis not present

## 2018-01-03 DIAGNOSIS — Z9981 Dependence on supplemental oxygen: Secondary | ICD-10-CM | POA: Diagnosis not present

## 2018-01-03 DIAGNOSIS — C719 Malignant neoplasm of brain, unspecified: Secondary | ICD-10-CM | POA: Diagnosis not present

## 2018-01-03 DIAGNOSIS — I10 Essential (primary) hypertension: Secondary | ICD-10-CM | POA: Diagnosis not present

## 2018-01-03 DIAGNOSIS — Z7984 Long term (current) use of oral hypoglycemic drugs: Secondary | ICD-10-CM | POA: Diagnosis not present

## 2018-01-03 DIAGNOSIS — F329 Major depressive disorder, single episode, unspecified: Secondary | ICD-10-CM | POA: Diagnosis not present

## 2018-01-03 DIAGNOSIS — E1142 Type 2 diabetes mellitus with diabetic polyneuropathy: Secondary | ICD-10-CM | POA: Diagnosis not present

## 2018-01-05 ENCOUNTER — Other Ambulatory Visit: Payer: Self-pay | Admitting: Family Medicine

## 2018-01-08 DEATH — deceased

## 2018-04-10 DEATH — deceased
